# Patient Record
Sex: Male | Born: 1960 | Hispanic: No | Marital: Married | Smoking: Never smoker
Health system: Southern US, Community
[De-identification: ages and names within clinical notes are randomized; demographics above are authoritative.]

## PROBLEM LIST (undated history)

## (undated) DIAGNOSIS — G8929 Other chronic pain: Secondary | ICD-10-CM

## (undated) DIAGNOSIS — M542 Cervicalgia: Secondary | ICD-10-CM

## (undated) DIAGNOSIS — M549 Dorsalgia, unspecified: Secondary | ICD-10-CM

## (undated) HISTORY — PX: CHOLECYSTECTOMY: SHX55

---

## 2015-10-02 ENCOUNTER — Inpatient Hospital Stay
Admission: EM | Admit: 2015-10-02 | Discharge: 2015-10-03 | DRG: 948 | Disposition: A | Discharge status: Home / Self Care | Payer: Medicaid Other | Attending: Internal Medicine | Admitting: Internal Medicine

## 2015-10-02 ENCOUNTER — Encounter: Payer: Self-pay | Admitting: Emergency Medicine

## 2015-10-02 ENCOUNTER — Emergency Department: Payer: Medicaid Other

## 2015-10-02 DIAGNOSIS — Z823 Family history of stroke: Secondary | ICD-10-CM | POA: Diagnosis not present

## 2015-10-02 DIAGNOSIS — Z833 Family history of diabetes mellitus: Secondary | ICD-10-CM

## 2015-10-02 DIAGNOSIS — R413 Other amnesia: Secondary | ICD-10-CM | POA: Diagnosis present

## 2015-10-02 DIAGNOSIS — G459 Transient cerebral ischemic attack, unspecified: Secondary | ICD-10-CM | POA: Diagnosis not present

## 2015-10-02 DIAGNOSIS — E781 Pure hyperglyceridemia: Secondary | ICD-10-CM | POA: Diagnosis present

## 2015-10-02 DIAGNOSIS — Z8249 Family history of ischemic heart disease and other diseases of the circulatory system: Secondary | ICD-10-CM | POA: Diagnosis not present

## 2015-10-02 DIAGNOSIS — G454 Transient global amnesia: Secondary | ICD-10-CM | POA: Diagnosis not present

## 2015-10-02 HISTORY — DX: Dorsalgia, unspecified: M54.2

## 2015-10-02 HISTORY — DX: Other chronic pain: G89.29

## 2015-10-02 HISTORY — DX: Dorsalgia, unspecified: M54.9

## 2015-10-02 LAB — COMPREHENSIVE METABOLIC PANEL
ALBUMIN: 4.3 g/dL (ref 3.5–5.0)
ALT: 27 U/L (ref 17–63)
AST: 28 U/L (ref 15–41)
Alkaline Phosphatase: 57 U/L (ref 38–126)
Anion gap: 7 (ref 5–15)
BILIRUBIN TOTAL: 1.2 mg/dL (ref 0.3–1.2)
BUN: 10 mg/dL (ref 6–20)
CHLORIDE: 106 mmol/L (ref 101–111)
CO2: 26 mmol/L (ref 22–32)
CREATININE: 0.87 mg/dL (ref 0.61–1.24)
Calcium: 8.7 mg/dL — ABNORMAL LOW (ref 8.9–10.3)
GFR calc Af Amer: >60 mL/min (ref >60)
GLUCOSE: 108 mg/dL — AB (ref 65–99)
Potassium: 3.8 mmol/L (ref 3.5–5.1)
Sodium: 139 mmol/L (ref 135–145)
TOTAL PROTEIN: 7.6 g/dL (ref 6.5–8.1)

## 2015-10-02 LAB — CBC WITH DIFFERENTIAL/PLATELET
Basophils Absolute: 0 10*3/uL (ref 0–0.1)
Basophils Relative: 1 %
EOS ABS: 0 10*3/uL (ref 0–0.7)
EOS PCT: 1 %
HCT: 48.1 % (ref 40.0–52.0)
Hemoglobin: 16.5 g/dL (ref 13.0–18.0)
LYMPHS ABS: 1 10*3/uL (ref 1.0–3.6)
LYMPHS PCT: 17 %
MCH: 30 pg (ref 26.0–34.0)
MCHC: 34.2 g/dL (ref 32.0–36.0)
MCV: 87.7 fL (ref 80.0–100.0)
MONO ABS: 0.4 10*3/uL (ref 0.2–1.0)
Monocytes Relative: 7 %
Neutro Abs: 4.6 10*3/uL (ref 1.4–6.5)
Neutrophils Relative %: 76 %
PLATELETS: 223 10*3/uL (ref 150–440)
RBC: 5.49 MIL/uL (ref 4.40–5.90)
RDW: 14.8 % — AB (ref 11.5–14.5)
WBC: 6.1 10*3/uL (ref 3.8–10.6)

## 2015-10-02 LAB — URINALYSIS COMPLETE WITH MICROSCOPIC (ARMC ONLY)
BACTERIA UA: NONE SEEN
Bilirubin Urine: NEGATIVE
Glucose, UA: NEGATIVE mg/dL
Ketones, ur: NEGATIVE mg/dL
Leukocytes, UA: NEGATIVE
NITRITE: NEGATIVE
PH: 7 (ref 5.0–8.0)
PROTEIN: NEGATIVE mg/dL
SQUAMOUS EPITHELIAL / LPF: NONE SEEN
Specific Gravity, Urine: 1.004 — ABNORMAL LOW (ref 1.005–1.030)
WBC UA: NONE SEEN WBC/hpf (ref 0–5)

## 2015-10-02 LAB — URINE DRUG SCREEN, QUALITATIVE (ARMC ONLY)
Amphetamines, Ur Screen: NOT DETECTED
BARBITURATES, UR SCREEN: NOT DETECTED
BENZODIAZEPINE, UR SCRN: NOT DETECTED
CANNABINOID 50 NG, UR ~~LOC~~: NOT DETECTED
Cocaine Metabolite,Ur ~~LOC~~: NOT DETECTED
MDMA (Ecstasy)Ur Screen: NOT DETECTED
METHADONE SCREEN, URINE: NOT DETECTED
Opiate, Ur Screen: NOT DETECTED
Phencyclidine (PCP) Ur S: NOT DETECTED
TRICYCLIC, UR SCREEN: NOT DETECTED

## 2015-10-02 LAB — PROTIME-INR
INR: 1.06
PROTHROMBIN TIME: 14 s (ref 11.4–15.0)

## 2015-10-02 LAB — TROPONIN I: Troponin I: <0.03 ng/mL (ref <0.03)

## 2015-10-02 MED ORDER — THIAMINE HCL 100 MG/ML IJ SOLN
100.0000 mg | Freq: Every day | INTRAMUSCULAR | Status: DC
  Administered 2015-10-03 (×2): 100 mg via INTRAVENOUS
  Filled 2015-10-02 (×2): qty 2

## 2015-10-02 MED ORDER — ENOXAPARIN SODIUM 40 MG/0.4ML ~~LOC~~ SOLN: 40.0000 mg | SUBCUTANEOUS | Status: DC

## 2015-10-02 MED ORDER — ACETAMINOPHEN 650 MG RE SUPP
650.0000 mg | RECTAL | Status: DC | PRN
Start: 2015-10-02 — End: 2015-10-03

## 2015-10-02 MED ORDER — STROKE: EARLY STAGES OF RECOVERY BOOK
Freq: Once | Status: AC
  Administered 2015-10-03: 01:00:00

## 2015-10-02 MED ORDER — ACETAMINOPHEN 325 MG PO TABS: 650.0000 mg | ORAL_TABLET | ORAL | Status: DC | PRN

## 2015-10-02 NOTE — H&P (Signed)
[facility]   PATIENT NAME: Rick Lowery    MR#:  [id number]  DATE OF BIRTH:  14-Jan-1961  DATE OF ADMISSION:  10/02/2015  PRIMARY CARE PHYSICIAN: No PCP Per Patient   REQUESTING/REFERRING PHYSICIAN: Derrill Kay, MD  CHIEF COMPLAINT:   Chief Complaint  Patient presents with  . Memory Loss    HISTORY OF PRESENT ILLNESS:  Rick Lowery  is a 55 y.o. male who presents with Complaint of memory loss. Patient and family state that starting this afternoon patient began asking his family questions about things that have happened earlier today he could not remember. Family states that patient was unable to tell them the date or what he had done in the morning. Upon interview by this writer patient also had difficulty remembering other things that involved long-term memory as well. Patient was unable to recall where he had worked for the last 10 years, with the last president was, recalling President Bush from 10 years ago as well. Initial workup in the ED was largely unremarkable, so hospitals were called for further workup and evaluation. Patient and family deny any history of focal muscular weakness, headache, fever or chills or infectious symptoms, seizure activity or seizure history.  PAST MEDICAL HISTORY:   Past Medical History  Diagnosis Date  . Chronic neck and back pain     PAST SURGICAL HISTORY:   Past Surgical History  Procedure Laterality Date  . Cholecystectomy      SOCIAL HISTORY:   Social History  Substance Use Topics  . Smoking status: Never Smoker   . Smokeless tobacco: Not on file  . Alcohol Use: No    FAMILY HISTORY:   Family History  Problem Relation Age of Onset  . Stroke Father   . Diabetes Mother   . Heart disease Mother     DRUG ALLERGIES:  No Known Allergies  MEDICATIONS AT HOME:   Prior to Admission medications   Not on File    REVIEW OF SYSTEMS:  Review of Systems  Constitutional:  Negative for fever, chills, weight loss and malaise/fatigue.  HENT: Negative for ear pain, hearing loss and tinnitus.   Eyes: Negative for blurred vision, double vision, pain and redness.  Respiratory: Negative for cough, hemoptysis and shortness of breath.   Cardiovascular: Negative for chest pain, palpitations, orthopnea and leg swelling.  Gastrointestinal: Negative for nausea, vomiting, abdominal pain, diarrhea and constipation.  Genitourinary: Negative for dysuria, frequency and hematuria.  Musculoskeletal: Negative for back pain, joint pain and neck pain.  Skin:       No acne, rash, or lesions  Neurological: Negative for dizziness, tremors, focal weakness and weakness.       Memory loss  Endo/Heme/Allergies: Negative for polydipsia. Does not bruise/bleed easily.  Psychiatric/Behavioral: Negative for depression. The patient is not nervous/anxious and does not have insomnia.      VITAL SIGNS:   Filed Vitals:   10/02/15 1948 10/02/15 2000 10/02/15 2030  BP: 167/88 167/97 169/89  Pulse: 79 73 76  Temp: 98 F (36.7 C)    TempSrc: Oral    Resp: 18 19 14   Weight: 103.63 kg (228 lb 7.4 oz)    SpO2: 98% 97% 96%   Wt Readings from Last 3 Encounters:  10/02/15 103.63 kg (228 lb 7.4 oz)    PHYSICAL EXAMINATION:  Physical Exam  Vitals reviewed. Constitutional: He appears well-developed and well-nourished. No distress.  HENT:  Head: Normocephalic and atraumatic.  Mouth/Throat: Oropharynx  is clear and moist.  Eyes: Conjunctivae and EOM are normal. Pupils are equal, round, and reactive to light. No scleral icterus.  Neck: Normal range of motion. Neck supple. No JVD present. No thyromegaly present.  Cardiovascular: Normal rate, regular rhythm and intact distal pulses.  Exam reveals no gallop and no friction rub.   No murmur heard. Respiratory: Effort normal and breath sounds normal. No respiratory distress. He has no wheezes. He has no rales.  GI: Soft. Bowel sounds are normal. He  exhibits no distension. There is no tenderness.  Musculoskeletal: Normal range of motion. He exhibits no edema.  No arthritis, no gout  Lymphadenopathy:    He has no cervical adenopathy.  Neurological: He is alert. No cranial nerve deficit.  Cranial nerves II-XII intact, Sensation intact to light touch/pinprick, 5/5 strength in all extremities, no dysarthria, no aphasia, no dysphagia, memory loss, see history of present illness for details, finger to nose testing showed no abnormality, no pronator drift, DTR intact, Babinski sign not present.   Skin: Skin is warm and dry. No rash noted. No erythema.  Psychiatric: He has a normal mood and affect. His behavior is normal. Judgment normal.    LABORATORY PANEL:   CBC  Recent Labs Lab 10/02/15 2007  WBC 6.1  HGB 16.5  HCT 48.1  PLT 223   ------------------------------------------------------------------------------------------------------------------  Chemistries   Recent Labs Lab 10/02/15 2007  NA 139  K 3.8  CL 106  CO2 26  GLUCOSE 108*  BUN 10  CREATININE 0.87  CALCIUM 8.7*  AST 28  ALT 27  ALKPHOS 57  BILITOT 1.2   ------------------------------------------------------------------------------------------------------------------  Cardiac Enzymes  Recent Labs Lab 10/02/15 2007  TROPONINI <0.03   ------------------------------------------------------------------------------------------------------------------  RADIOLOGY:  Ct Head Wo Contrast  10/02/2015  CLINICAL DATA:  Memory loss.  Code stroke. EXAM: CT HEAD WITHOUT CONTRAST TECHNIQUE: Contiguous axial images were obtained from the base of the skull through the vertex without intravenous contrast. COMPARISON:  None. FINDINGS: Ventricles and sulci are appropriate for patient's age. No evidence for acute large territory infarct, intracranial hemorrhage, mass lesion or mass-effect. Suggestion of increased density within the right MCA (image 14; series 2). Orbits are  unremarkable. Paranasal sinuses are well aerated. Mastoid air cells are unremarkable. Calvarium is intact. IMPRESSION: Suggestion of hyperdense right MCA. Cannot exclude thrombus. Recommend clinical correlation and MRI/ MRA as clinically indicated. No evidence for acute intracranial hemorrhage. Critical Value/emergent results were called by telephone at the time of interpretation on 10/02/2015 at 7:49 pm to Dr. Phineas Semen , who verbally acknowledged these results. Electronically Signed   By: Annia Belt M.D.   On: 10/02/2015 19:49    EKG:   Orders placed or performed during the hospital encounter of 10/02/15  . EKG 12-Lead  . EKG 12-Lead    IMPRESSION AND PLAN:  Principal Problem:   Amnesia - unclear etiology at this time. CT scan in the ED had only one abnormality commented where question the possibility of a venous thrombosis. We will pursue standard stroke workup to start per stroke order set, which will also give [place] MRI/MRA for further elucidation of any cerebral pathology. Neurology consult ordered.  All the records are reviewed and case discussed with ED provider. Management plans discussed with the patient and/or family.  DVT PROPHYLAXIS: SubQ lovenox  GI PROPHYLAXIS: None  ADMISSION STATUS: Inpatient  CODE STATUS: Full Code Status History    This patient does not have a recorded code status. Please follow your organizational policy for  patients in this situation.      TOTAL TIME TAKING CARE OF THIS PATIENT: 45 minutes.    Lovene Maret FIELDING 10/02/2015, 9:10 PM  [facility]  Office  [phone]  CC: Primary care physician; No PCP Per Patient

## 2015-10-02 NOTE — ED Notes (Signed)
Neuro dr on soc for exam at this time

## 2015-10-02 NOTE — ED Provider Notes (Signed)
[facility]   ____________________________________________  Time seen: ~1945  I have reviewed the triage vital signs and the nursing notes.   HISTORY  Chief Complaint Memory loss  History limited by: Memory loss   HPI Rick Lowery is a 55 y.o. male who is brought to the emergency department today by his family because of concerns for memory loss. The patient was noted to have had memory loss by family roughly 30 minutes prior to my evaluation. He came into the room and asked what day it was what he done throughout the day. Family then realized he did not know the day or month or the events of the day. Family states that the last time they saw him normal was around 2 PM. The patient never had symptoms like this before. He denies any pain. Unfortunately it is clear that he is quite confused because he repeatedly asks why he is in the emergency department.    History reviewed. No pertinent past medical history.  There are no active problems to display for this patient.   History reviewed. No pertinent past surgical history.  No current outpatient prescriptions on file.  Allergies Review of patient's allergies indicates no known allergies.  No family history on file.  Social History Social History  Substance Use Topics  . Smoking status: Never Smoker   . Smokeless tobacco: None  . Alcohol Use: No    Review of Systems  Constitutional: Negative for fever. Cardiovascular: Negative for chest pain. Respiratory: Negative for shortness of breath. Gastrointestinal: Negative for abdominal pain, vomiting and diarrhea. Neurological: Negative for headaches, focal weakness or numbness. 10-point ROS otherwise negative.  ____________________________________________   PHYSICAL EXAM:  VITAL SIGNS:   98 F (36.7 C)  79  18   167/88 mmHg  98 %     Constitutional: Awake and alert. Not oriented to day, month,  events. Eyes: Conjunctivae are normal. PERRL. Normal extraocular movements. ENT   Head: Normocephalic and atraumatic.   Nose: No congestion/rhinnorhea.   Mouth/Throat: Mucous membranes are moist.   Neck: No stridor. Hematological/Lymphatic/Immunilogical: No cervical lymphadenopathy. Cardiovascular: Normal rate, regular rhythm.  No murmurs, rubs, or gallops. Respiratory: Normal respiratory effort without tachypnea nor retractions. Breath sounds are clear and equal bilaterally. No wheezes/rales/rhonchi. Gastrointestinal: Soft and nontender. No distention. T Genitourinary: Deferred Musculoskeletal: Normal range of motion in all extremities. No joint effusions.  No lower extremity tenderness nor edema. Neurologic:  Normal speech and language. Patient unable to recall the day, month or events. Repeatedly asks the same question why he is here overnight over again. Does not know how he arrived at the hospital. Does not appear to be able to form any short-term memory at this time. Face is symmetric. PERRL. Extraocular motions intact. Tongue midline. No upper or lower extremity drift. Sensation grossly intact. Finger to nose normal. No gross focal neurologic deficits are appreciated.  Skin:  Skin is warm, dry and intact. No rash noted.   ____________________________________________    LABS (pertinent positives/negatives)  Labs Reviewed  COMPREHENSIVE METABOLIC PANEL - Abnormal; Notable for the following:    Glucose, Bld 108 (*)    Calcium 8.7 (*)    All other components within normal limits  CBC WITH DIFFERENTIAL/PLATELET - Abnormal; Notable for the following:    RDW 14.8 (*)    All other components within normal limits  TROPONIN I  PROTIME-INR    ____________________________________________   EKG  I, Phineas SemenGraydon Kalmen Lollar, attending physician, personally viewed  and interpreted this EKG  EKG Time: 1951 Rate: 77 Rhythm: normal sinus rhythm Axis: left axis deviation Intervals:  qtc 427 QRS: narrow ST changes: no st elevation Impression: abnormal ekg  ____________________________________________    RADIOLOGY  CT head IMPRESSION: Suggestion of hyperdense right MCA. Cannot exclude thrombus. Recommend clinical correlation and MRI/ MRA as clinically indicated.  No evidence for acute intracranial hemorrhage.  I, Konnor Vondrasek, personally viewed and evaluated these images (plain radiographs) as part of my medical decision making. ____________________________________________   PROCEDURES  Procedure(s) performed: None  Critical Care performed: No  ____________________________________________   INITIAL IMPRESSION / ASSESSMENT AND PLAN / ED COURSE  Pertinent labs & imaging results that were available during my care of the patient were reviewed by me and considered in my medical decision making (see chart for details).  Patient presented to the emergency department today brought in by family today because of concerns for memory loss. Last known normal was roughly 5-1/2 hours prior to my evaluation. The patient does not appear to be able to perform any new short-term memory. Additionally he is unable to recall the month day or why he is here. No other focal neuro deficits. Teleneurology saw patient thinks this potentially global transient amnesia. They did recommend admission for further workup and testing.  ____________________________________________   FINAL CLINICAL IMPRESSION(S) / ED DIAGNOSES  Final diagnoses:  Amnesia     Note: This dictation was prepared with Dragon dictation. Any transcriptional errors that result from this process are unintentional    Phineas Semen, MD 10/02/15 2040

## 2015-10-02 NOTE — ED Notes (Signed)
Per wife patient developed memory loss and confusion about 20 minutes prior to arrival. Wife states that the patient took a nap and when woke was repeating questions and could not remember what he had been doing today. Wife reports that the last time he was seen normal was around 14:00.

## 2015-10-03 ENCOUNTER — Inpatient Hospital Stay (HOSPITAL_COMMUNITY)
Admit: 2015-10-03 | Discharge: 2015-10-03 | Disposition: A | Discharge status: Home / Self Care | Payer: Medicaid Other | Attending: Internal Medicine | Admitting: Internal Medicine

## 2015-10-03 ENCOUNTER — Inpatient Hospital Stay: Payer: Medicaid Other

## 2015-10-03 DIAGNOSIS — G459 Transient cerebral ischemic attack, unspecified: Secondary | ICD-10-CM

## 2015-10-03 DIAGNOSIS — G454 Transient global amnesia: Secondary | ICD-10-CM

## 2015-10-03 LAB — LIPID PANEL
CHOL/HDL RATIO: 5.5 ratio
Cholesterol: 166 mg/dL (ref 0–200)
HDL: 30 mg/dL — ABNORMAL LOW (ref >40)
LDL CALC: 86 mg/dL (ref 0–99)
Triglycerides: 252 mg/dL — ABNORMAL HIGH (ref <150)
VLDL: 50 mg/dL — AB (ref 0–40)

## 2015-10-03 LAB — ECHOCARDIOGRAM COMPLETE
HEIGHTINCHES: 68 in
Weight: 3571 oz

## 2015-10-03 LAB — HEMOGLOBIN A1C: Hgb A1c MFr Bld: 5.9 % (ref 4.0–6.0)

## 2015-10-03 MED ORDER — ASPIRIN EC 81 MG PO TBEC: 81.0000 mg | DELAYED_RELEASE_TABLET | Freq: Every day | ORAL | Status: AC

## 2015-10-03 MED ORDER — GADOBENATE DIMEGLUMINE 529 MG/ML IV SOLN
20.0000 mL | Freq: Once | INTRAVENOUS | Status: AC | PRN
  Administered 2015-10-03: 11:00:00 20 mL via INTRAVENOUS

## 2015-10-03 NOTE — Discharge Summary (Signed)
Rick Lowery, 55 y.o., DOB [birth year], MRN [id number]. Admission date: 10/02/2015 Discharge Date 10/03/2015 Primary MD No PCP Per Patient Admitting Physician Oralia Manis, MD  Admission Diagnosis  Amnesia [R41.3]  Discharge Diagnosis   Principal Problem:   Amnesia   Chronic neck and back pain Hypertriglyceridemia        Hospital Course  Rick Lowery is a 55 y.o. male who presents with Complaint of memory loss. Patient and family state that starting this afternoon patient began asking his family questions about things that have happened earlier today he could not remember. Family states that patient was unable to tell them the date or what he had done in the morning. Upon interview by this writer patient also had difficulty remembering other things that involved long-term memory as well. Patient was unable to recall where he had worked for the last 10 years, with the last president was, recalling President Bush from 10 years ago as well. Initial workup in the ED was largely unremarkable, so hospitals were called for further workup and evaluation. The CT scan of the head showed possible abnormality. He was admitted underwent MRI of the brain and MRA of the brain which was completely normal. He does report some stressors at work. Patient has a echo that is currently pending. He'll be seen by neurology prior to discharge. His symptoms are completely resolved possible TIA could be the explanation for his symptoms          Consults  neurology  Significant Tests:  See full reports for all details      Ct Head Wo Contrast  10/02/2015  CLINICAL DATA:  Memory loss.  Code stroke. EXAM: CT HEAD WITHOUT CONTRAST TECHNIQUE: Contiguous axial images were obtained from the base of the skull through the vertex without intravenous contrast. COMPARISON:  None. FINDINGS: Ventricles and sulci are appropriate for patient's age. No evidence for acute large territory infarct, intracranial  hemorrhage, mass lesion or mass-effect. Suggestion of increased density within the right MCA (image 14; series 2). Orbits are unremarkable. Paranasal sinuses are well aerated. Mastoid air cells are unremarkable. Calvarium is intact. IMPRESSION: Suggestion of hyperdense right MCA. Cannot exclude thrombus. Recommend clinical correlation and MRI/ MRA as clinically indicated. No evidence for acute intracranial hemorrhage. Critical Value/emergent results were called by telephone at the time of interpretation on 10/02/2015 at 7:49 pm to Dr. Phineas Semen , who verbally acknowledged these results. Electronically Signed   By: Annia Belt M.D.   On: 10/02/2015 19:49   Mr Angiogram Neck W Wo Contrast  10/03/2015  CLINICAL DATA:  55 year old male with episode of severe memory loss yesterday. Initial encounter. EXAM: MRI HEAD WITHOUT CONTRAST MRA HEAD WITHOUT CONTRAST MRA NECK WITHOUT AND WITH CONTRAST TECHNIQUE: Multiplanar, multiecho pulse sequences of the brain and surrounding structures were obtained without intravenous contrast. Angiographic images of the Circle of Willis were obtained using MRA technique without intravenous contrast. Angiographic images of the neck were obtained using MRA technique without and with intravenous contrast. Carotid stenosis measurements (when applicable) are obtained utilizing NASCET criteria, using the distal internal carotid diameter as the denominator. CONTRAST:  20mL MULTIHANCE GADOBENATE DIMEGLUMINE 529 MG/ML IV SOLN COMPARISON:  Noncontrast head CT 10/02/2015 FINDINGS: MRI HEAD FINDINGS Major intracranial vascular flow voids are preserved, the distal right vertebral artery appears dominant. No restricted diffusion or evidence of acute infarction. No midline shift, mass effect, evidence of mass lesion, ventriculomegaly, extra-axial collection or acute intracranial hemorrhage. Cervicomedullary junction and pituitary are within normal limits.  Negative visualized cervical spine. Wallace Cullens  and white matter signal is within normal limits for age throughout the brain. No cortical encephalomalacia or chronic cerebral blood products identified. Temporal lobe structures appear normal. Visible internal auditory structures appear normal. Visualized paranasal sinuses and mastoids are stable and well pneumatized. Negative orbit and scalp soft tissues. Normal bone marrow signal. MRA NECK FINDINGS Precontrast time-of-flight images demonstrate antegrade flow in both carotid and vertebral arteries in the neck. The right vertebral artery is dominant. The carotid bifurcations appear normal. Post-contrast neck MRA images reveal a 3 vessel arch configuration with no great vessel origin stenosis. Negative right CCA, right carotid bifurcation, and cervical right ICA. Negative left CCA. Minimal irregularity at the left carotid bifurcation and left ICA origin compatible with atherosclerosis. No subsequent stenosis. Mildly tortuous cervical left ICA just distal to the bulb. No proximal subclavian artery stenosis. Mild irregularity at both vertebral artery origins does not appear hemodynamically significant. The right vertebral is dominant throughout with no significant stenosis. Negative left vertebral artery, which is mildly diminutive distal to the left PICA origin. MRA HEAD FINDINGS Antegrade flow in the posterior circulation with dominant right and diminutive distal left vertebral artery is. Normal PICA origins. Patent vertebrobasilar junction. No basilar stenosis. Normal SCA origins. Fetal type right PCA origin. Left posterior communicating artery diminutive or absent. Normal bilateral PCA branches. Antegrade flow in both ICA siphons. The right siphon appears mildly dominant. No siphon stenosis. Normal ophthalmic and right posterior communicating artery origins. Patent carotid termini. Dominant right ACA A1 segment. Normal MCA and ACA origins. Anterior communicating artery and visualized bilateral ACA branches are  normal aside from mild tortuosity. MCA M1 segments are also somewhat tortuous, more so the right. MCA bifurcations and visualized bilateral MCA branches are within normal limits. IMPRESSION: 1. No acute intracranial abnormality. Normal for age noncontrast MRI appearance of the brain. 2. Negative neck MRA aside from mild left carotid and vertebral origin atherosclerosis. No hemodynamically significant stenosis identified. 3. Negative intracranial MRA aside from mild vessel tortuosity. Normal anatomic variants including fetal type right PCA origin an dominant right ACA A1 segment. Electronically Signed   By: Odessa Fleming M.D.   On: 10/03/2015 10:47   Mr Brain Wo Contrast  10/03/2015  CLINICAL DATA:  55 year old male with episode of severe memory loss yesterday. Initial encounter. EXAM: MRI HEAD WITHOUT CONTRAST MRA HEAD WITHOUT CONTRAST MRA NECK WITHOUT AND WITH CONTRAST TECHNIQUE: Multiplanar, multiecho pulse sequences of the brain and surrounding structures were obtained without intravenous contrast. Angiographic images of the Circle of Willis were obtained using MRA technique without intravenous contrast. Angiographic images of the neck were obtained using MRA technique without and with intravenous contrast. Carotid stenosis measurements (when applicable) are obtained utilizing NASCET criteria, using the distal internal carotid diameter as the denominator. CONTRAST:  20mL MULTIHANCE GADOBENATE DIMEGLUMINE 529 MG/ML IV SOLN COMPARISON:  Noncontrast head CT 10/02/2015 FINDINGS: MRI HEAD FINDINGS Major intracranial vascular flow voids are preserved, the distal right vertebral artery appears dominant. No restricted diffusion or evidence of acute infarction. No midline shift, mass effect, evidence of mass lesion, ventriculomegaly, extra-axial collection or acute intracranial hemorrhage. Cervicomedullary junction and pituitary are within normal limits. Negative visualized cervical spine. Wallace Cullens and white matter signal is  within normal limits for age throughout the brain. No cortical encephalomalacia or chronic cerebral blood products identified. Temporal lobe structures appear normal. Visible internal auditory structures appear normal. Visualized paranasal sinuses and mastoids are stable and well pneumatized. Negative orbit and scalp soft tissues.  Normal bone marrow signal. MRA NECK FINDINGS Precontrast time-of-flight images demonstrate antegrade flow in both carotid and vertebral arteries in the neck. The right vertebral artery is dominant. The carotid bifurcations appear normal. Post-contrast neck MRA images reveal a 3 vessel arch configuration with no great vessel origin stenosis. Negative right CCA, right carotid bifurcation, and cervical right ICA. Negative left CCA. Minimal irregularity at the left carotid bifurcation and left ICA origin compatible with atherosclerosis. No subsequent stenosis. Mildly tortuous cervical left ICA just distal to the bulb. No proximal subclavian artery stenosis. Mild irregularity at both vertebral artery origins does not appear hemodynamically significant. The right vertebral is dominant throughout with no significant stenosis. Negative left vertebral artery, which is mildly diminutive distal to the left PICA origin. MRA HEAD FINDINGS Antegrade flow in the posterior circulation with dominant right and diminutive distal left vertebral artery is. Normal PICA origins. Patent vertebrobasilar junction. No basilar stenosis. Normal SCA origins. Fetal type right PCA origin. Left posterior communicating artery diminutive or absent. Normal bilateral PCA branches. Antegrade flow in both ICA siphons. The right siphon appears mildly dominant. No siphon stenosis. Normal ophthalmic and right posterior communicating artery origins. Patent carotid termini. Dominant right ACA A1 segment. Normal MCA and ACA origins. Anterior communicating artery and visualized bilateral ACA branches are normal aside from mild  tortuosity. MCA M1 segments are also somewhat tortuous, more so the right. MCA bifurcations and visualized bilateral MCA branches are within normal limits. IMPRESSION: 1. No acute intracranial abnormality. Normal for age noncontrast MRI appearance of the brain. 2. Negative neck MRA aside from mild left carotid and vertebral origin atherosclerosis. No hemodynamically significant stenosis identified. 3. Negative intracranial MRA aside from mild vessel tortuosity. Normal anatomic variants including fetal type right PCA origin an dominant right ACA A1 segment. Electronically Signed   By: Odessa Fleming M.D.   On: 10/03/2015 10:47   Mr Maxine Glenn Head/brain Wo Cm  10/03/2015  CLINICAL DATA:  55 year old male with episode of severe memory loss yesterday. Initial encounter. EXAM: MRI HEAD WITHOUT CONTRAST MRA HEAD WITHOUT CONTRAST MRA NECK WITHOUT AND WITH CONTRAST TECHNIQUE: Multiplanar, multiecho pulse sequences of the brain and surrounding structures were obtained without intravenous contrast. Angiographic images of the Circle of Willis were obtained using MRA technique without intravenous contrast. Angiographic images of the neck were obtained using MRA technique without and with intravenous contrast. Carotid stenosis measurements (when applicable) are obtained utilizing NASCET criteria, using the distal internal carotid diameter as the denominator. CONTRAST:  20mL MULTIHANCE GADOBENATE DIMEGLUMINE 529 MG/ML IV SOLN COMPARISON:  Noncontrast head CT 10/02/2015 FINDINGS: MRI HEAD FINDINGS Major intracranial vascular flow voids are preserved, the distal right vertebral artery appears dominant. No restricted diffusion or evidence of acute infarction. No midline shift, mass effect, evidence of mass lesion, ventriculomegaly, extra-axial collection or acute intracranial hemorrhage. Cervicomedullary junction and pituitary are within normal limits. Negative visualized cervical spine. Wallace Cullens and white matter signal is within normal limits  for age throughout the brain. No cortical encephalomalacia or chronic cerebral blood products identified. Temporal lobe structures appear normal. Visible internal auditory structures appear normal. Visualized paranasal sinuses and mastoids are stable and well pneumatized. Negative orbit and scalp soft tissues. Normal bone marrow signal. MRA NECK FINDINGS Precontrast time-of-flight images demonstrate antegrade flow in both carotid and vertebral arteries in the neck. The right vertebral artery is dominant. The carotid bifurcations appear normal. Post-contrast neck MRA images reveal a 3 vessel arch configuration with no great vessel origin stenosis. Negative right CCA, right carotid  bifurcation, and cervical right ICA. Negative left CCA. Minimal irregularity at the left carotid bifurcation and left ICA origin compatible with atherosclerosis. No subsequent stenosis. Mildly tortuous cervical left ICA just distal to the bulb. No proximal subclavian artery stenosis. Mild irregularity at both vertebral artery origins does not appear hemodynamically significant. The right vertebral is dominant throughout with no significant stenosis. Negative left vertebral artery, which is mildly diminutive distal to the left PICA origin. MRA HEAD FINDINGS Antegrade flow in the posterior circulation with dominant right and diminutive distal left vertebral artery is. Normal PICA origins. Patent vertebrobasilar junction. No basilar stenosis. Normal SCA origins. Fetal type right PCA origin. Left posterior communicating artery diminutive or absent. Normal bilateral PCA branches. Antegrade flow in both ICA siphons. The right siphon appears mildly dominant. No siphon stenosis. Normal ophthalmic and right posterior communicating artery origins. Patent carotid termini. Dominant right ACA A1 segment. Normal MCA and ACA origins. Anterior communicating artery and visualized bilateral ACA branches are normal aside from mild tortuosity. MCA M1 segments  are also somewhat tortuous, more so the right. MCA bifurcations and visualized bilateral MCA branches are within normal limits. IMPRESSION: 1. No acute intracranial abnormality. Normal for age noncontrast MRI appearance of the brain. 2. Negative neck MRA aside from mild left carotid and vertebral origin atherosclerosis. No hemodynamically significant stenosis identified. 3. Negative intracranial MRA aside from mild vessel tortuosity. Normal anatomic variants including fetal type right PCA origin an dominant right ACA A1 segment. Electronically Signed   By: Odessa Fleming M.D.   On: 10/03/2015 10:47       Today   Subjective:   Rick Lowery  patient feels well denies any symptoms memory back to normal except he Still cannot remember the events of yesterday  Objective:   Blood pressure 130/72, pulse 64, temperature 98.2 F (36.8 C), temperature source Oral, resp. rate 20, height 5\' 8"  (1.727 m), weight 101.237 kg (223 lb 3 oz), SpO2 95 %.  .  Intake/Output Summary (Last 24 hours) at 10/03/15 1336 Last data filed at 10/03/15 0900  Gross per 24 hour  Intake    240 ml  Output      0 ml  Net    240 ml    Exam VITAL SIGNS: Blood pressure 130/72, pulse 64, temperature 98.2 F (36.8 C), temperature source Oral, resp. rate 20, height 5\' 8"  (1.727 m), weight 101.237 kg (223 lb 3 oz), SpO2 95 %.  GENERAL:  55 y.o.-year-old patient lying in the bed with no acute distress.  EYES: Pupils equal, round, reactive to light and accommodation. No scleral icterus. Extraocular muscles intact.  HEENT: Head atraumatic, normocephalic. Oropharynx and nasopharynx clear.  NECK:  Supple, no jugular venous distention. No thyroid enlargement, no tenderness.  LUNGS: Normal breath sounds bilaterally, no wheezing, rales,rhonchi or crepitation. No use of accessory muscles of respiration.  CARDIOVASCULAR: S1, S2 normal. No murmurs, rubs, or gallops.  ABDOMEN: Soft, nontender, nondistended. Bowel sounds present. No  organomegaly or mass.  EXTREMITIES: No pedal edema, cyanosis, or clubbing.  NEUROLOGIC: Cranial nerves II through XII are intact. Muscle strength 5/5 in all extremities. Sensation intact. Gait not checked.  PSYCHIATRIC: The patient is alert and oriented x 3.  SKIN: No obvious rash, lesion, or ulcer.   Data Review     CBC w Diff: Lab Results  Component Value Date   WBC 6.1 10/02/2015   HGB 16.5 10/02/2015   HCT 48.1 10/02/2015   PLT 223 10/02/2015   LYMPHOPCT 17 10/02/2015  MONOPCT 7 10/02/2015   EOSPCT 1 10/02/2015   BASOPCT 1 10/02/2015   CMP: Lab Results  Component Value Date   NA 139 10/02/2015   K 3.8 10/02/2015   CL 106 10/02/2015   CO2 26 10/02/2015   BUN 10 10/02/2015   CREATININE 0.87 10/02/2015   PROT 7.6 10/02/2015   ALBUMIN 4.3 10/02/2015   BILITOT 1.2 10/02/2015   ALKPHOS 57 10/02/2015   AST 28 10/02/2015   ALT 27 10/02/2015  .  Micro Results No results found for this or any previous visit (from the past 240 hour(s)).      Code Status Orders        Start     Ordered   10/02/15 2226  Full code   Continuous     10/02/15 2225    Code Status History    Date Active Date Inactive Code Status Order ID Comments User Context   This patient has a current code status but no historical code status.            Discharge Medications     Medication List    TAKE these medications        aspirin EC 81 MG tablet  Take 1 tablet (81 mg total) by mouth daily.           Total Time in preparing paper work, data evaluation and todays exam - 35 minutes  Auburn BilberryPATEL, Amilee Janvier M.D on 10/03/2015 at 1:36 PM  [facility]   Office  [phone]

## 2015-10-03 NOTE — Evaluation (Signed)
Physical Therapy Evaluation Patient Details Name: Rick Lowery MRN: [id number] DOB: 07/28/1960 Today's Date: 10/03/2015   History of Present Illness  Pt is a 55 y.o. male who presents with complaint of memory loss. Patient and family state that starting 10/02/15 patient began asking his family questions about things that have happened earlier that day. Family states that patient was unable to recall the date or events from the morning. 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 Patient and family deny any history of focal muscular weakness, headache, fever or chills or infectious symptoms, seizure activity or seizure history. At time of PT evaluation all symptoms have resolved and pt reports return to baseline  Clinical Impression  No deficits identified during PT evaluation. Pt appears to be at baseline level of function. He is independent with bed mobility, transfers, and ambulation. Pt able to perform horizontal and vertical head turns as well as gait speed changes without staggering. Able to perform 180 degree turns safely in <3 seconds and single leg stance is >10 seconds on each LE. Strength and sensation fully intact and symmetrical. All neurological bedside testing negative. No further PT needs at this time. Order will be completed. Please enter new order if status or needs change.      Follow Up Recommendations No PT follow up    Equipment Recommendations  None recommended by PT    Recommendations for Other Services       Precautions / Restrictions Precautions Precautions: None Restrictions Weight Bearing Restrictions: No      Mobility  Bed Mobility Overal bed mobility: Independent             General bed mobility comments: Good speed/sequencing noted  Transfers Overall transfer level: Independent               General transfer comment: Good speed and sequencing noted. No  deficits  Ambulation/Gait Ambulation/Gait assistance: Independent Ambulation Distance (Feet): 200 Feet Assistive device: None Gait Pattern/deviations: WFL(Within Functional Limits) Gait velocity: WFL for full community ambulation Gait velocity interpretation: at or above normal speed for age/gender General Gait Details: Pt demonstrates baseline gait which [facility]. Full community ambulation speed. Able to perform horizontal and vertical head turns as well as gait speed changes without staggering. Able to perform 180 degree turns safely in <3 seconds  Stairs            Wheelchair Mobility    Modified Rankin (Stroke Patients Only)       Balance Overall balance assessment: No apparent balance deficits (not formally assessed) Sitting-balance support: No upper extremity supported Sitting balance-Leahy Scale: Normal     Standing balance support: No upper extremity supported Standing balance-Leahy Scale: Normal Standing balance comment: Single leg stance >10 seconds bilaterally. Negative Rhomberg                             Pertinent Vitals/Pain Pain Assessment: No/denies pain    Home Living Family/patient expects to be discharged to:: Private residence Living Arrangements: Spouse/significant other;Children (5 children) Available Help at Discharge: Family Type of Home: House Home Access: Stairs to enter Entrance Stairs-Rails: Doctor, general practiceight;Left Entrance Stairs-Number of Steps: 5 Home Layout: One level Home Equipment: None Additional Comments: grab bars in bathroom per son's report    Prior Function Level of Independence: Independent  Hand Dominance   Dominant Hand: Right    Extremity/Trunk Assessment   Upper Extremity Assessment: Overall WFL for tasks assessed           Lower Extremity Assessment: Overall WFL for tasks assessed;RLE deficits/detail RLE Deficits / Details: No UE/LE strength deficits noted. Finger to nose and heel to shin  normal. Negative pronator drift and normal rapid alternating movements. Sensation and strength intact and symmetrical    Cervical / Trunk Assessment: Normal  Communication   Communication: No difficulties  Cognition Arousal/Alertness: Awake/alert Behavior During Therapy: WFL for tasks assessed/performed Overall Cognitive Status: Within Functional Limits for tasks assessed Area of Impairment: Memory     Memory: Decreased short-term memory         General Comments: Pt and spouse report improving memory, now recalls long term memory events such as where he has worked for past 10 yrs, recalls getting haircut on Sunday but spouse reports difficulty with sequencing of events on Sunday. Pt reports only unable to recall "gap of time from when I left the house and came to the hospital".    General Comments      Exercises        Assessment/Plan    PT Assessment Patent does not need any further PT services  PT Diagnosis     PT Problem List    PT Treatment Interventions     PT Goals (Current goals can be found in the Care Plan section) Acute Rehab PT Goals Patient Stated Goal: to go home PT Goal Formulation: With patient/family Time For Goal Achievement: 10/17/15 Potential to Achieve Goals: Good    Frequency     Barriers to discharge        Co-evaluation               End of Session Equipment Utilized During Treatment: Gait belt Activity Tolerance: No increased pain Patient left: in bed;with call bell/phone within reach;with family/visitor present Nurse Communication: Mobility status    Functional Assessment Tool Used: clinical judgement Functional Limitation: Mobility: Walking and moving around Mobility: Walking and Moving Around Current Status [phone]): 0 percent impaired, limited or restricted Mobility: Walking and Moving Around Goal Status [phone]): 0 percent impaired, limited or restricted Mobility: Walking and Moving Around Discharge Status [phone]): 0 percent  impaired, limited or restricted    Time: 1101-1119 PT Time Calculation (min) (ACUTE ONLY): 18 min   Charges:   PT Evaluation $PT Eval Low Complexity: 1 Procedure     PT G Codes:   PT G-Codes **NOT FOR INPATIENT CLASS** Functional Assessment Tool Used: clinical judgement Functional Limitation: Mobility: Walking and moving around Mobility: Walking and Moving Around Current Status ([id number]): 0 percent impaired, limited or restricted Mobility: Walking and Moving Around Goal Status ([id number]): 0 percent impaired, limited or restricted Mobility: Walking and Moving Around Discharge Status ([id number]): 0 percent impaired, limited or restricted   Lynnea Maizes PT, DPT   Deval Mroczka 10/03/2015, 11:41 AM

## 2015-10-03 NOTE — Progress Notes (Signed)
Echocardiogram 2D Echocardiogram has been performed.  Rick Lowery, Rick Lowery M 10/03/2015, 2:27 PM

## 2015-10-03 NOTE — Progress Notes (Signed)
Patient is alert and oriented x 4, family notes that mentation has improved dramatically compared to previous day, pt is up in room to bathroom, denies pain, good appetite, family at bedside, MRI brain and angiogram unremarkable, echo performed, awaiting on results. Per family, Dr. Allena KatzPatel told them they can be discharged without waiting on echo results, family has number provided by Dr. Allena KatzPatel. Pt is d/c to home, rx for aspirin provided to patient, patient instructed to establish a PCP and follow up. Neuro consulted with patient and family. Pushed to visitor entrance via nursing staff. Uneventful shift.

## 2015-10-03 NOTE — Progress Notes (Signed)
[facility] [place] [facility]        Sigmund Syble CreekDiez Faldin was admitted to the Hospital on 10/02/2015 and Discharged  10/03/2015 and should be excused from work/school   for7 days starting 10/02/2015 , may return to work/school without any restrictions.  Call Auburn BilberryShreyang Thresea Doble MD with questions.  Auburn BilberryPATEL, Isaul Landi M.D on 10/03/2015,at 12:06 PM  [facility] -  at [facility]    Office  [phone]

## 2015-10-03 NOTE — Progress Notes (Signed)
ST received orders, nursing consulted and wife interviewed. Pt awaiting CT scan. Wife and son report that pt's memory has improved and now appears at baseline. Nursing supports. POC established that pt would receive scans this day and allowed another day for improvement with ST to follow-up on 10/04/2015 for any remaining issues. Nursing and wife voiced agreement with plan.

## 2015-10-03 NOTE — Discharge Instructions (Addendum)
°  DIET:  Regular diet  DISCHARGE CONDITION:  Activity as tolerated  ACTIVITY:  Activity as tolerated  OXYGEN:  Home Oxygen: No.   Oxygen Delivery: room air  DISCHARGE LOCATION:  home    ADDITIONAL DISCHARGE INSTRUCTION:   If you experience worsening of your admission symptoms, develop shortness of breath, life threatening emergency, suicidal or homicidal thoughts you must seek medical attention immediately by calling 911 or calling your MD immediately  if symptoms less severe.  You Must read complete instructions/literature along with all the possible adverse reactions/side effects for all the Medicines you take and that have been prescribed to you. Take any new Medicines after you have completely understood and accpet all the possible adverse reactions/side effects.   Please note  You were cared for by a hospitalist during your hospital stay. If you have any questions about your discharge medications or the care you received while you were in the hospital after you are discharged, you can call the unit and asked to speak with the hospitalist on call if the hospitalist that took care of you is not available. Once you are discharged, your primary care physician will handle any further medical issues. Please note that NO REFILLS for any discharge medications will be authorized once you are discharged, as it is imperative that you return to your primary care physician (or establish a relationship with a primary care physician if you do not have one) for your aftercare needs so that they can reassess your need for medications and monitor your lab values.

## 2015-10-03 NOTE — Evaluation (Signed)
Occupational Therapy Evaluation Patient Details Name: Rick Lowery MRN: [id number] DOB: 01/30/1961 Today's Date: 10/03/2015    History of Present Illness Pt is a 55 y.o. male who presents with complaint of memory loss. Patient and family state that starting 10/02/15 patient began asking his family questions about things that have happened earlier that day. Family states that patient was unable to recall the date or events from the morning. 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 Patient and family deny any history of focal muscular weakness, headache, fever or chills or infectious symptoms, seizure activity or seizure history.   Clinical Impression   55 yo male pt admitted for amnesia. Pt presents today with improved long term memory (no apparent deficits) and improving short term memory with reported "memory gap" from the time he and his family left the house yesterday, 10/02/15, to the time he arrived at the hospital. Pt oriented to person, place, time, and situation upon evaluation, no visual perceptual deficits upon evaluation. Spouse reports memory back to baseline for pt. Pt independent with ADL. Pt would benefit from speech consult if not already ordered. No further OT needs at this time.     Follow Up Recommendations  No OT follow up    Equipment Recommendations  None recommended by OT    Recommendations for Other Services Speech consult     Precautions / Restrictions Precautions Precautions: None Restrictions Weight Bearing Restrictions: No      Mobility Bed Mobility Overal bed mobility: Independent                Transfers Overall transfer level: Independent               General transfer comment: performed sit<>stand and sup<>sit transfers independently with spouse and son present in room    Balance Overall balance assessment: No apparent balance deficits (not  formally assessed)                                          ADL Overall ADL's : Independent                                       General ADL Comments: Able to perform all ADL at baseline per pt and spouse report. Observed pt complete toileting task independently at start of session.     Vision Vision Assessment?: Yes Eye Alignment: Within Functional Limits Ocular Range of Motion: Within Functional Limits Alignment/Gaze Preference: Within Defined Limits Tracking/Visual Pursuits: Able to track stimulus in all quads without difficulty Convergence: Within functional limits Visual Fields: No apparent deficits   Perception Perception Perception Tested?: Yes Comments: within functional limits, no apparent perceptual deficits   Praxis Praxis Praxis tested?: Within functional limits    Pertinent Vitals/Pain Pain Assessment: No/denies pain     Hand Dominance     Extremity/Trunk Assessment Upper Extremity Assessment Upper Extremity Assessment: Overall WFL for tasks assessed, equal strength on both sides   Lower Extremity Assessment Lower Extremity Assessment: Defer to PT evaluation   Cervical / Trunk Assessment Cervical / Trunk Assessment: Normal   Communication Communication Communication: No difficulties   Cognition Arousal/Alertness: Awake/alert Behavior During Therapy: WFL for tasks assessed/performed Overall Cognitive Status: Impaired/Different from  baseline Area of Impairment: Memory     Memory: Decreased short-term memory         General Comments: Pt and spouse report improving memory, now recalls long term memory events such as where he has worked for past 10 yrs, recalls getting haircut on Sunday but spouse reports difficulty with sequencing of events on Sunday. Pt reports only unable to recall "gap of time from when I left the house and came to the hospital". Spouse reports only other behavior that was "out of the ordinary"  was pt very talkative last evening until about 4 a.m.    General Comments       Exercises       Shoulder Instructions      Home Living Family/patient expects to be discharged to:: Private residence Living Arrangements: Spouse/significant other;Children Available Help at Discharge: Family Type of Home: House       Home Layout: One level     Bathroom Shower/Tub: Tub/shower unit Shower/tub characteristics: Door Firefighter: Standard         Additional Comments: grab bars in bathroom per son's report      Prior Functioning/Environment Level of Independence: Independent             OT Diagnosis: Cognitive deficits   OT Problem List: Decreased cognition   OT Treatment/Interventions:      OT Goals(Current goals can be found in the care plan section) Acute Rehab OT Goals Patient Stated Goal: to go home OT Goal Formulation: With patient/family Potential to Achieve Goals: Good  OT Frequency:     Barriers to D/C:            Co-evaluation              End of Session    Activity Tolerance: Patient tolerated treatment well Patient left: in bed;with call bell/phone within reach;with family/visitor present   Time: 1610-9604 OT Time Calculation (min): 11 min Charges:  OT General Charges $OT Visit: 1 Procedure OT Evaluation $OT Eval Low Complexity: 1 Procedure G-Codes:    Eliezer Bottom, OTR/L 10/03/2015, 9:28 AM

## 2015-10-03 NOTE — Consult Note (Signed)
Reason for Consult:Episode of amnesia Referring Physician: Allena KatzPatel  CC: Amnesia  HPI: Rick Lowery is an 55 y.o. male who by report of family at 5p on yesterday began asking his family questions about things that have happened earlier today he could not remember. Family states that patient was unable to tell them the date or what he had done in the morning.  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  Could only remember his residence from 12 years ago.  Asked his family the same questions over and over again all night as if they had never been asked for the majority of the night but now appears to be at baseline but has some issues with recent history.    Past Medical History  Diagnosis Date  . Chronic neck and back pain     Past Surgical History  Procedure Laterality Date  . Cholecystectomy      Family History  Problem Relation Age of Onset  . Stroke Father   . Diabetes Mother   . Heart disease Mother     Social History:  reports that he has never smoked. He does not have any smokeless tobacco history on file. He reports that he does not drink alcohol or use illicit drugs.  No Known Allergies  Medications:  I have reviewed the patient's current medications. Prior to Admission:  No prescriptions prior to admission   Scheduled: . enoxaparin (LOVENOX) injection  40 mg Subcutaneous Q24H  . thiamine IV  100 mg Intravenous Daily    ROS: History obtained from the patient  General ROS: negative for - chills, fatigue, fever, night sweats, weight gain or weight loss Psychological ROS: negative for - behavioral disorder, hallucinations, memory difficulties, mood swings or suicidal ideation Ophthalmic ROS: negative for - blurry vision, double vision, eye pain or loss of vision ENT ROS: negative for - epistaxis, nasal discharge, oral lesions, sore throat, tinnitus or  vertigo Allergy and Immunology ROS: negative for - hives or itchy/watery eyes Hematological and Lymphatic ROS: negative for - bleeding problems, bruising or swollen lymph nodes Endocrine ROS: negative for - galactorrhea, hair pattern changes, polydipsia/polyuria or temperature intolerance Respiratory ROS: negative for - cough, hemoptysis, shortness of breath or wheezing Cardiovascular ROS: negative for - chest pain, dyspnea on exertion, edema or irregular heartbeat Gastrointestinal ROS: negative for - abdominal pain, diarrhea, hematemesis, nausea/vomiting or stool incontinence Genito-Urinary ROS: negative for - dysuria, hematuria, incontinence or urinary frequency/urgency Musculoskeletal ROS: negative for - joint swelling or muscular weakness Neurological ROS: as noted in HPI Dermatological ROS: negative for rash and skin lesion changes  Physical Examination: Blood pressure 120/73, pulse 59, temperature 97.9 F (36.6 C), temperature source Oral, resp. rate 20, height 5\' 8"  (1.727 m), weight 101.237 kg (223 lb 3 oz), SpO2 96 %.  HEENT-  Normocephalic, no lesions, without obvious abnormality.  Normal external eye and conjunctiva.  Normal TM's bilaterally.  Normal auditory canals and external ears. Normal external nose, mucus membranes and septum.  Normal pharynx. Cardiovascular- S1, S2 normal, pulses palpable throughout   Lungs- chest clear, no wheezing, rales, normal symmetric air entry Abdomen- soft, non-tender; bowel sounds normal; no masses,  no organomegaly Extremities- no edema Lymph-no adenopathy palpable Musculoskeletal-no joint tenderness, deformity or swelling Skin-warm and dry, no hyperpigmentation, vitiligo, or suspicious lesions  Neurological Examination Mental Status: Alert, oriented, thought content appropriate.  Speech fluent without  evidence of aphasia.  Able to follow 3 step commands without difficulty. Cranial Nerves: II: Discs flat bilaterally; Visual fields grossly  normal, pupils equal, round, reactive to light and accommodation III,IV, VI: ptosis not present, extra-ocular motions intact bilaterally V,VII: smile symmetric, facial light touch sensation normal bilaterally VIII: hearing normal bilaterally IX,X: gag reflex present XI: bilateral shoulder shrug XII: midline tongue extension Motor: Right : Upper extremity   5/5    Left:     Upper extremity   5/5  Lower extremity   5/5     Lower extremity   5/5 Tone and bulk:normal tone throughout; no atrophy noted Sensory: Pinprick and light touch intact throughout, bilaterally Deep Tendon Reflexes: 2+ and symmetric throughout Plantars: Right: downgoing   Left: downgoing Cerebellar: normal finger-to-nose and normal heel-to-shin testing bilaterally Gait: normal gait and station      Laboratory Studies:   Basic Metabolic Panel:  Recent Labs Lab 10/02/15 2007  NA 139  K 3.8  CL 106  CO2 26  GLUCOSE 108*  BUN 10  CREATININE 0.87  CALCIUM 8.7*    Liver Function Tests:  Recent Labs Lab 10/02/15 2007  AST 28  ALT 27  ALKPHOS 57  BILITOT 1.2  PROT 7.6  ALBUMIN 4.3   No results for input(s): LIPASE, AMYLASE in the last 168 hours. No results for input(s): AMMONIA in the last 168 hours.  CBC:  Recent Labs Lab 10/02/15 2007  WBC 6.1  NEUTROABS 4.6  HGB 16.5  HCT 48.1  MCV 87.7  PLT 223    Cardiac Enzymes:  Recent Labs Lab 10/02/15 2007  TROPONINI <0.03    BNP: Invalid input(s): POCBNP  CBG: No results for input(s): GLUCAP in the last 168 hours.  Microbiology: No results found for this or any previous visit.  Coagulation Studies:  Recent Labs  10/02/15 2007  LABPROT 14.0  INR 1.06    Urinalysis:  Recent Labs Lab 10/02/15 2027  COLORURINE COLORLESS*  LABSPEC 1.004*  PHURINE 7.0  GLUCOSEU NEGATIVE  HGBUR 1+*  BILIRUBINUR NEGATIVE  KETONESUR NEGATIVE  PROTEINUR NEGATIVE  NITRITE NEGATIVE  LEUKOCYTESUR NEGATIVE    Lipid Panel:     Component  Value Date/Time   CHOL 166 10/03/2015 0559   TRIG 252* 10/03/2015 0559   HDL 30* 10/03/2015 0559   CHOLHDL 5.5 10/03/2015 0559   VLDL 50* 10/03/2015 0559   LDLCALC 86 10/03/2015 0559    HgbA1C:  Lab Results  Component Value Date   HGBA1C 5.9 10/03/2015    Urine Drug Screen:     Component Value Date/Time   LABOPIA NONE DETECTED 10/02/2015 2027   COCAINSCRNUR NONE DETECTED 10/02/2015 2027   LABBENZ NONE DETECTED 10/02/2015 2027   AMPHETMU NONE DETECTED 10/02/2015 2027   THCU NONE DETECTED 10/02/2015 2027   LABBARB NONE DETECTED 10/02/2015 2027    Alcohol Level: No results for input(s): ETH in the last 168 hours.  Other results: EKG: normal sinus rhythm at 77 bpm.  Imaging: Ct Head Wo Contrast  10/02/2015  CLINICAL DATA:  Memory loss.  Code stroke. EXAM: CT HEAD WITHOUT CONTRAST TECHNIQUE: Contiguous axial images were obtained from the base of the skull through the vertex without intravenous contrast. COMPARISON:  None. FINDINGS: Ventricles and sulci are appropriate for patient's age. No evidence for acute large territory infarct, intracranial hemorrhage, mass lesion or mass-effect. Suggestion of increased density within the right MCA (image 14; series 2). Orbits are unremarkable. Paranasal sinuses are well aerated. Mastoid air cells are unremarkable. Calvarium  is intact. IMPRESSION: Suggestion of hyperdense right MCA. Cannot exclude thrombus. Recommend clinical correlation and MRI/ MRA as clinically indicated. No evidence for acute intracranial hemorrhage. Critical Value/emergent results were called by telephone at the time of interpretation on 10/02/2015 at 7:49 pm to Dr. Phineas Semen , who verbally acknowledged these results. Electronically Signed   By: Annia Belt M.D.   On: 10/02/2015 19:49   Mr Angiogram Neck W Wo Contrast  10/03/2015  CLINICAL DATA:  55 year old male with episode of severe memory loss yesterday. Initial encounter. EXAM: MRI HEAD WITHOUT CONTRAST MRA HEAD  WITHOUT CONTRAST MRA NECK WITHOUT AND WITH CONTRAST TECHNIQUE: Multiplanar, multiecho pulse sequences of the brain and surrounding structures were obtained without intravenous contrast. Angiographic images of the Circle of Willis were obtained using MRA technique without intravenous contrast. Angiographic images of the neck were obtained using MRA technique without and with intravenous contrast. Carotid stenosis measurements (when applicable) are obtained utilizing NASCET criteria, using the distal internal carotid diameter as the denominator. CONTRAST:  20mL MULTIHANCE GADOBENATE DIMEGLUMINE 529 MG/ML IV SOLN COMPARISON:  Noncontrast head CT 10/02/2015 FINDINGS: MRI HEAD FINDINGS Major intracranial vascular flow voids are preserved, the distal right vertebral artery appears dominant. No restricted diffusion or evidence of acute infarction. No midline shift, mass effect, evidence of mass lesion, ventriculomegaly, extra-axial collection or acute intracranial hemorrhage. Cervicomedullary junction and pituitary are within normal limits. Negative visualized cervical spine. Wallace Cullens and white matter signal is within normal limits for age throughout the brain. No cortical encephalomalacia or chronic cerebral blood products identified. Temporal lobe structures appear normal. Visible internal auditory structures appear normal. Visualized paranasal sinuses and mastoids are stable and well pneumatized. Negative orbit and scalp soft tissues. Normal bone marrow signal. MRA NECK FINDINGS Precontrast time-of-flight images demonstrate antegrade flow in both carotid and vertebral arteries in the neck. The right vertebral artery is dominant. The carotid bifurcations appear normal. Post-contrast neck MRA images reveal a 3 vessel arch configuration with no great vessel origin stenosis. Negative right CCA, right carotid bifurcation, and cervical right ICA. Negative left CCA. Minimal irregularity at the left carotid bifurcation and left ICA  origin compatible with atherosclerosis. No subsequent stenosis. Mildly tortuous cervical left ICA just distal to the bulb. No proximal subclavian artery stenosis. Mild irregularity at both vertebral artery origins does not appear hemodynamically significant. The right vertebral is dominant throughout with no significant stenosis. Negative left vertebral artery, which is mildly diminutive distal to the left PICA origin. MRA HEAD FINDINGS Antegrade flow in the posterior circulation with dominant right and diminutive distal left vertebral artery is. Normal PICA origins. Patent vertebrobasilar junction. No basilar stenosis. Normal SCA origins. Fetal type right PCA origin. Left posterior communicating artery diminutive or absent. Normal bilateral PCA branches. Antegrade flow in both ICA siphons. The right siphon appears mildly dominant. No siphon stenosis. Normal ophthalmic and right posterior communicating artery origins. Patent carotid termini. Dominant right ACA A1 segment. Normal MCA and ACA origins. Anterior communicating artery and visualized bilateral ACA branches are normal aside from mild tortuosity. MCA M1 segments are also somewhat tortuous, more so the right. MCA bifurcations and visualized bilateral MCA branches are within normal limits. IMPRESSION: 1. No acute intracranial abnormality. Normal for age noncontrast MRI appearance of the brain. 2. Negative neck MRA aside from mild left carotid and vertebral origin atherosclerosis. No hemodynamically significant stenosis identified. 3. Negative intracranial MRA aside from mild vessel tortuosity. Normal anatomic variants including fetal type right PCA origin an dominant right ACA A1 segment.  Electronically Signed   By: Odessa Fleming M.D.   On: 10/03/2015 10:47   Mr Brain Wo Contrast  10/03/2015  CLINICAL DATA:  55 year old male with episode of severe memory loss yesterday. Initial encounter. EXAM: MRI HEAD WITHOUT CONTRAST MRA HEAD WITHOUT CONTRAST MRA NECK WITHOUT  AND WITH CONTRAST TECHNIQUE: Multiplanar, multiecho pulse sequences of the brain and surrounding structures were obtained without intravenous contrast. Angiographic images of the Circle of Willis were obtained using MRA technique without intravenous contrast. Angiographic images of the neck were obtained using MRA technique without and with intravenous contrast. Carotid stenosis measurements (when applicable) are obtained utilizing NASCET criteria, using the distal internal carotid diameter as the denominator. CONTRAST:  20mL MULTIHANCE GADOBENATE DIMEGLUMINE 529 MG/ML IV SOLN COMPARISON:  Noncontrast head CT 10/02/2015 FINDINGS: MRI HEAD FINDINGS Major intracranial vascular flow voids are preserved, the distal right vertebral artery appears dominant. No restricted diffusion or evidence of acute infarction. No midline shift, mass effect, evidence of mass lesion, ventriculomegaly, extra-axial collection or acute intracranial hemorrhage. Cervicomedullary junction and pituitary are within normal limits. Negative visualized cervical spine. Wallace Cullens and white matter signal is within normal limits for age throughout the brain. No cortical encephalomalacia or chronic cerebral blood products identified. Temporal lobe structures appear normal. Visible internal auditory structures appear normal. Visualized paranasal sinuses and mastoids are stable and well pneumatized. Negative orbit and scalp soft tissues. Normal bone marrow signal. MRA NECK FINDINGS Precontrast time-of-flight images demonstrate antegrade flow in both carotid and vertebral arteries in the neck. The right vertebral artery is dominant. The carotid bifurcations appear normal. Post-contrast neck MRA images reveal a 3 vessel arch configuration with no great vessel origin stenosis. Negative right CCA, right carotid bifurcation, and cervical right ICA. Negative left CCA. Minimal irregularity at the left carotid bifurcation and left ICA origin compatible with  atherosclerosis. No subsequent stenosis. Mildly tortuous cervical left ICA just distal to the bulb. No proximal subclavian artery stenosis. Mild irregularity at both vertebral artery origins does not appear hemodynamically significant. The right vertebral is dominant throughout with no significant stenosis. Negative left vertebral artery, which is mildly diminutive distal to the left PICA origin. MRA HEAD FINDINGS Antegrade flow in the posterior circulation with dominant right and diminutive distal left vertebral artery is. Normal PICA origins. Patent vertebrobasilar junction. No basilar stenosis. Normal SCA origins. Fetal type right PCA origin. Left posterior communicating artery diminutive or absent. Normal bilateral PCA branches. Antegrade flow in both ICA siphons. The right siphon appears mildly dominant. No siphon stenosis. Normal ophthalmic and right posterior communicating artery origins. Patent carotid termini. Dominant right ACA A1 segment. Normal MCA and ACA origins. Anterior communicating artery and visualized bilateral ACA branches are normal aside from mild tortuosity. MCA M1 segments are also somewhat tortuous, more so the right. MCA bifurcations and visualized bilateral MCA branches are within normal limits. IMPRESSION: 1. No acute intracranial abnormality. Normal for age noncontrast MRI appearance of the brain. 2. Negative neck MRA aside from mild left carotid and vertebral origin atherosclerosis. No hemodynamically significant stenosis identified. 3. Negative intracranial MRA aside from mild vessel tortuosity. Normal anatomic variants including fetal type right PCA origin an dominant right ACA A1 segment. Electronically Signed   By: Odessa Fleming M.D.   On: 10/03/2015 10:47   Mr Maxine Glenn Head/brain Wo Cm  10/03/2015  CLINICAL DATA:  55 year old male with episode of severe memory loss yesterday. Initial encounter. EXAM: MRI HEAD WITHOUT CONTRAST MRA HEAD WITHOUT CONTRAST MRA NECK WITHOUT AND WITH CONTRAST  TECHNIQUE:  Multiplanar, multiecho pulse sequences of the brain and surrounding structures were obtained without intravenous contrast. Angiographic images of the Circle of Willis were obtained using MRA technique without intravenous contrast. Angiographic images of the neck were obtained using MRA technique without and with intravenous contrast. Carotid stenosis measurements (when applicable) are obtained utilizing NASCET criteria, using the distal internal carotid diameter as the denominator. CONTRAST:  20mL MULTIHANCE GADOBENATE DIMEGLUMINE 529 MG/ML IV SOLN COMPARISON:  Noncontrast head CT 10/02/2015 FINDINGS: MRI HEAD FINDINGS Major intracranial vascular flow voids are preserved, the distal right vertebral artery appears dominant. No restricted diffusion or evidence of acute infarction. No midline shift, mass effect, evidence of mass lesion, ventriculomegaly, extra-axial collection or acute intracranial hemorrhage. Cervicomedullary junction and pituitary are within normal limits. Negative visualized cervical spine. Wallace Cullens and white matter signal is within normal limits for age throughout the brain. No cortical encephalomalacia or chronic cerebral blood products identified. Temporal lobe structures appear normal. Visible internal auditory structures appear normal. Visualized paranasal sinuses and mastoids are stable and well pneumatized. Negative orbit and scalp soft tissues. Normal bone marrow signal. MRA NECK FINDINGS Precontrast time-of-flight images demonstrate antegrade flow in both carotid and vertebral arteries in the neck. The right vertebral artery is dominant. The carotid bifurcations appear normal. Post-contrast neck MRA images reveal a 3 vessel arch configuration with no great vessel origin stenosis. Negative right CCA, right carotid bifurcation, and cervical right ICA. Negative left CCA. Minimal irregularity at the left carotid bifurcation and left ICA origin compatible with atherosclerosis. No  subsequent stenosis. Mildly tortuous cervical left ICA just distal to the bulb. No proximal subclavian artery stenosis. Mild irregularity at both vertebral artery origins does not appear hemodynamically significant. The right vertebral is dominant throughout with no significant stenosis. Negative left vertebral artery, which is mildly diminutive distal to the left PICA origin. MRA HEAD FINDINGS Antegrade flow in the posterior circulation with dominant right and diminutive distal left vertebral artery is. Normal PICA origins. Patent vertebrobasilar junction. No basilar stenosis. Normal SCA origins. Fetal type right PCA origin. Left posterior communicating artery diminutive or absent. Normal bilateral PCA branches. Antegrade flow in both ICA siphons. The right siphon appears mildly dominant. No siphon stenosis. Normal ophthalmic and right posterior communicating artery origins. Patent carotid termini. Dominant right ACA A1 segment. Normal MCA and ACA origins. Anterior communicating artery and visualized bilateral ACA branches are normal aside from mild tortuosity. MCA M1 segments are also somewhat tortuous, more so the right. MCA bifurcations and visualized bilateral MCA branches are within normal limits. IMPRESSION: 1. No acute intracranial abnormality. Normal for age noncontrast MRI appearance of the brain. 2. Negative neck MRA aside from mild left carotid and vertebral origin atherosclerosis. No hemodynamically significant stenosis identified. 3. Negative intracranial MRA aside from mild vessel tortuosity. Normal anatomic variants including fetal type right PCA origin an dominant right ACA A1 segment. Electronically Signed   By: Odessa Fleming M.D.   On: 10/03/2015 10:47     Assessment/Plan: 55 year old male presenting with symptoms suggestive of an episode of TGA.  Neurological examinaiton is nonfocal.  MRI of the brain has been personally reviewed and shows no acute changes.  MRA is unremarkable.  Diagnosis and  prognosis explained to patient and family at length.    Recommendations: 1.  Patient stable for discharge.  May discuss the need for EEG with PCP as an outpatient.   2.  ASA 81mg  daily    Thana Farr, MD Neurology [phone] 10/03/2015, 2:45 PM

## 2019-11-19 ENCOUNTER — Inpatient Hospital Stay
Admission: EM | Admit: 2019-11-19 | Discharge: 2020-01-18 | DRG: 207 | Disposition: E | Payer: HRSA Program | Attending: Pulmonary Disease | Admitting: Pulmonary Disease

## 2019-11-19 ENCOUNTER — Encounter: Payer: Self-pay | Admitting: Emergency Medicine

## 2019-11-19 ENCOUNTER — Other Ambulatory Visit: Payer: Self-pay

## 2019-11-19 ENCOUNTER — Emergency Department: Payer: HRSA Program

## 2019-11-19 DIAGNOSIS — Z7982 Long term (current) use of aspirin: Secondary | ICD-10-CM

## 2019-11-19 DIAGNOSIS — R7301 Impaired fasting glucose: Secondary | ICD-10-CM | POA: Diagnosis not present

## 2019-11-19 DIAGNOSIS — J9690 Respiratory failure, unspecified, unspecified whether with hypoxia or hypercapnia: Secondary | ICD-10-CM

## 2019-11-19 DIAGNOSIS — D696 Thrombocytopenia, unspecified: Secondary | ICD-10-CM | POA: Diagnosis not present

## 2019-11-19 DIAGNOSIS — N179 Acute kidney failure, unspecified: Secondary | ICD-10-CM | POA: Diagnosis not present

## 2019-11-19 DIAGNOSIS — J1282 Pneumonia due to coronavirus disease 2019: Secondary | ICD-10-CM

## 2019-11-19 DIAGNOSIS — G4700 Insomnia, unspecified: Secondary | ICD-10-CM | POA: Diagnosis not present

## 2019-11-19 DIAGNOSIS — E871 Hypo-osmolality and hyponatremia: Secondary | ICD-10-CM | POA: Diagnosis present

## 2019-11-19 DIAGNOSIS — R739 Hyperglycemia, unspecified: Secondary | ICD-10-CM

## 2019-11-19 DIAGNOSIS — N5089 Other specified disorders of the male genital organs: Secondary | ICD-10-CM | POA: Diagnosis not present

## 2019-11-19 DIAGNOSIS — R1084 Generalized abdominal pain: Secondary | ICD-10-CM

## 2019-11-19 DIAGNOSIS — E870 Hyperosmolality and hypernatremia: Secondary | ICD-10-CM | POA: Diagnosis present

## 2019-11-19 DIAGNOSIS — G8929 Other chronic pain: Secondary | ICD-10-CM | POA: Diagnosis present

## 2019-11-19 DIAGNOSIS — D638 Anemia in other chronic diseases classified elsewhere: Secondary | ICD-10-CM | POA: Diagnosis not present

## 2019-11-19 DIAGNOSIS — Z9049 Acquired absence of other specified parts of digestive tract: Secondary | ICD-10-CM

## 2019-11-19 DIAGNOSIS — G928 Other toxic encephalopathy: Secondary | ICD-10-CM | POA: Diagnosis not present

## 2019-11-19 DIAGNOSIS — I214 Non-ST elevation (NSTEMI) myocardial infarction: Secondary | ICD-10-CM | POA: Diagnosis not present

## 2019-11-19 DIAGNOSIS — K436 Other and unspecified ventral hernia with obstruction, without gangrene: Secondary | ICD-10-CM | POA: Diagnosis present

## 2019-11-19 DIAGNOSIS — U071 COVID-19: Principal | ICD-10-CM | POA: Diagnosis present

## 2019-11-19 DIAGNOSIS — A4189 Other specified sepsis: Secondary | ICD-10-CM | POA: Diagnosis not present

## 2019-11-19 DIAGNOSIS — Z5329 Procedure and treatment not carried out because of patient's decision for other reasons: Secondary | ICD-10-CM | POA: Diagnosis not present

## 2019-11-19 DIAGNOSIS — J9600 Acute respiratory failure, unspecified whether with hypoxia or hypercapnia: Secondary | ICD-10-CM

## 2019-11-19 DIAGNOSIS — L89896 Pressure-induced deep tissue damage of other site: Secondary | ICD-10-CM | POA: Diagnosis not present

## 2019-11-19 DIAGNOSIS — E8809 Other disorders of plasma-protein metabolism, not elsewhere classified: Secondary | ICD-10-CM | POA: Diagnosis not present

## 2019-11-19 DIAGNOSIS — E43 Unspecified severe protein-calorie malnutrition: Secondary | ICD-10-CM | POA: Diagnosis present

## 2019-11-19 DIAGNOSIS — L89892 Pressure ulcer of other site, stage 2: Secondary | ICD-10-CM | POA: Diagnosis not present

## 2019-11-19 DIAGNOSIS — R0602 Shortness of breath: Secondary | ICD-10-CM

## 2019-11-19 DIAGNOSIS — Z5900 Homelessness unspecified: Secondary | ICD-10-CM

## 2019-11-19 DIAGNOSIS — J982 Interstitial emphysema: Secondary | ICD-10-CM | POA: Diagnosis not present

## 2019-11-19 DIAGNOSIS — Z4659 Encounter for fitting and adjustment of other gastrointestinal appliance and device: Secondary | ICD-10-CM

## 2019-11-19 DIAGNOSIS — R6521 Severe sepsis with septic shock: Secondary | ICD-10-CM | POA: Diagnosis not present

## 2019-11-19 DIAGNOSIS — Z515 Encounter for palliative care: Secondary | ICD-10-CM | POA: Diagnosis not present

## 2019-11-19 DIAGNOSIS — L8990 Pressure ulcer of unspecified site, unspecified stage: Secondary | ICD-10-CM | POA: Insufficient documentation

## 2019-11-19 DIAGNOSIS — R1110 Vomiting, unspecified: Secondary | ICD-10-CM

## 2019-11-19 DIAGNOSIS — Z978 Presence of other specified devices: Secondary | ICD-10-CM

## 2019-11-19 DIAGNOSIS — J80 Acute respiratory distress syndrome: Secondary | ICD-10-CM | POA: Diagnosis present

## 2019-11-19 DIAGNOSIS — R0902 Hypoxemia: Secondary | ICD-10-CM

## 2019-11-19 DIAGNOSIS — R7989 Other specified abnormal findings of blood chemistry: Secondary | ICD-10-CM

## 2019-11-19 DIAGNOSIS — Z0189 Encounter for other specified special examinations: Secondary | ICD-10-CM

## 2019-11-19 DIAGNOSIS — R7401 Elevation of levels of liver transaminase levels: Secondary | ICD-10-CM | POA: Diagnosis present

## 2019-11-19 DIAGNOSIS — J8410 Pulmonary fibrosis, unspecified: Secondary | ICD-10-CM | POA: Diagnosis not present

## 2019-11-19 DIAGNOSIS — R579 Shock, unspecified: Secondary | ICD-10-CM

## 2019-11-19 DIAGNOSIS — Z66 Do not resuscitate: Secondary | ICD-10-CM | POA: Diagnosis not present

## 2019-11-19 DIAGNOSIS — Z01818 Encounter for other preprocedural examination: Secondary | ICD-10-CM

## 2019-11-19 DIAGNOSIS — R5081 Fever presenting with conditions classified elsewhere: Secondary | ICD-10-CM

## 2019-11-19 DIAGNOSIS — R531 Weakness: Secondary | ICD-10-CM

## 2019-11-19 DIAGNOSIS — Z8249 Family history of ischemic heart disease and other diseases of the circulatory system: Secondary | ICD-10-CM

## 2019-11-19 DIAGNOSIS — D6489 Other specified anemias: Secondary | ICD-10-CM | POA: Diagnosis present

## 2019-11-19 DIAGNOSIS — J9601 Acute respiratory failure with hypoxia: Secondary | ICD-10-CM

## 2019-11-19 DIAGNOSIS — K430 Incisional hernia with obstruction, without gangrene: Secondary | ICD-10-CM | POA: Diagnosis present

## 2019-11-19 DIAGNOSIS — Z6831 Body mass index (BMI) 31.0-31.9, adult: Secondary | ICD-10-CM

## 2019-11-19 DIAGNOSIS — L89812 Pressure ulcer of head, stage 2: Secondary | ICD-10-CM | POA: Diagnosis not present

## 2019-11-19 DIAGNOSIS — E669 Obesity, unspecified: Secondary | ICD-10-CM | POA: Diagnosis present

## 2019-11-19 LAB — URINALYSIS, COMPLETE (UACMP) WITH MICROSCOPIC
Bacteria, UA: NONE SEEN
Bilirubin Urine: NEGATIVE
Glucose, UA: NEGATIVE mg/dL
Ketones, ur: NEGATIVE mg/dL
Leukocytes,Ua: NEGATIVE
Nitrite: NEGATIVE
Protein, ur: NEGATIVE mg/dL
Specific Gravity, Urine: 1.002 — ABNORMAL LOW (ref 1.005–1.030)
Squamous Epithelial / LPF: NONE SEEN (ref 0–5)
pH: 7 (ref 5.0–8.0)

## 2019-11-19 LAB — CBC WITH DIFFERENTIAL/PLATELET
Abs Immature Granulocytes: 0.09 10*3/uL — ABNORMAL HIGH (ref 0.00–0.07)
Basophils Absolute: 0 10*3/uL (ref 0.0–0.1)
Basophils Relative: 0 %
Eosinophils Absolute: 0 10*3/uL (ref 0.0–0.5)
Eosinophils Relative: 0 %
HCT: 41 % (ref 39.0–52.0)
Hemoglobin: 14.8 g/dL (ref 13.0–17.0)
Immature Granulocytes: 2 %
Lymphocytes Relative: 9 %
Lymphs Abs: 0.5 10*3/uL — ABNORMAL LOW (ref 0.7–4.0)
MCH: 29.4 pg (ref 26.0–34.0)
MCHC: 36.1 g/dL — ABNORMAL HIGH (ref 30.0–36.0)
MCV: 81.3 fL (ref 80.0–100.0)
Monocytes Absolute: 0.4 10*3/uL (ref 0.1–1.0)
Monocytes Relative: 7 %
Neutro Abs: 4.5 10*3/uL (ref 1.7–7.7)
Neutrophils Relative %: 82 %
Platelets: 229 10*3/uL (ref 150–400)
RBC: 5.04 MIL/uL (ref 4.22–5.81)
RDW: 14.6 % (ref 11.5–15.5)
WBC: 5.4 10*3/uL (ref 4.0–10.5)
nRBC: 0 % (ref 0.0–0.2)

## 2019-11-19 LAB — COMPREHENSIVE METABOLIC PANEL
ALT: 72 U/L — ABNORMAL HIGH (ref 0–44)
AST: 98 U/L — ABNORMAL HIGH (ref 15–41)
Albumin: 3.1 g/dL — ABNORMAL LOW (ref 3.5–5.0)
Alkaline Phosphatase: 62 U/L (ref 38–126)
Anion gap: 11 (ref 5–15)
BUN: 10 mg/dL (ref 8–23)
CO2: 22 mmol/L (ref 22–32)
Calcium: 8.1 mg/dL — ABNORMAL LOW (ref 8.9–10.3)
Chloride: 93 mmol/L — ABNORMAL LOW (ref 98–111)
Creatinine, Ser: 0.9 mg/dL (ref 0.61–1.24)
GFR calc Af Amer: >60 mL/min (ref >60)
GFR calc non Af Amer: >60 mL/min (ref >60)
Glucose, Bld: 138 mg/dL — ABNORMAL HIGH (ref 70–99)
Potassium: 3.7 mmol/L (ref 3.5–5.1)
Sodium: 126 mmol/L — ABNORMAL LOW (ref 135–145)
Total Bilirubin: 1.3 mg/dL — ABNORMAL HIGH (ref 0.3–1.2)
Total Protein: 7.1 g/dL (ref 6.5–8.1)

## 2019-11-19 LAB — LACTIC ACID, PLASMA: Lactic Acid, Venous: 1.5 mmol/L (ref 0.5–1.9)

## 2019-11-19 LAB — ABO/RH: ABO/RH(D): A POS

## 2019-11-19 LAB — SARS CORONAVIRUS 2 BY RT PCR (HOSPITAL ORDER, PERFORMED IN ~~LOC~~ HOSPITAL LAB): SARS Coronavirus 2: POSITIVE — AB

## 2019-11-19 MED ORDER — SODIUM CHLORIDE 0.9 % IV SOLN: 100.0000 mg | Freq: Every day | INTRAVENOUS | Status: DC

## 2019-11-19 MED ORDER — METHYLPREDNISOLONE SODIUM SUCC 40 MG IJ SOLR
0.2500 mg/kg | Freq: Two times a day (BID) | INTRAMUSCULAR | Status: DC
  Administered 2019-11-19 – 2019-11-21 (×4): 26 mg via INTRAVENOUS
  Filled 2019-11-19 (×4): qty 1

## 2019-11-19 MED ORDER — ZINC SULFATE 220 (50 ZN) MG PO CAPS
220.0000 mg | ORAL_CAPSULE | Freq: Every day | ORAL | Status: DC
  Administered 2019-11-19 – 2019-12-02 (×14): 220 mg via ORAL
  Filled 2019-11-19 (×14): qty 1

## 2019-11-19 MED ORDER — ALBUTEROL SULFATE HFA 108 (90 BASE) MCG/ACT IN AERS
2.0000 | INHALATION_SPRAY | Freq: Four times a day (QID) | RESPIRATORY_TRACT | Status: DC
  Administered 2019-11-19 – 2019-12-01 (×47): 2 via RESPIRATORY_TRACT
  Filled 2019-11-19 (×2): qty 6.7

## 2019-11-19 MED ORDER — SODIUM CHLORIDE 0.9 % IV SOLN
200.0000 mg | Freq: Once | INTRAVENOUS | Status: DC
  Filled 2019-11-19: qty 40

## 2019-11-19 MED ORDER — SODIUM CHLORIDE 0.9 % IV SOLN
200.0000 mg | Freq: Once | INTRAVENOUS | Status: AC
  Administered 2019-11-19: 200 mg via INTRAVENOUS
  Filled 2019-11-19: qty 200

## 2019-11-19 MED ORDER — ASCORBIC ACID 500 MG PO TABS
500.0000 mg | ORAL_TABLET | Freq: Every day | ORAL | Status: DC
  Administered 2019-11-20 – 2019-12-01 (×12): 500 mg via ORAL
  Filled 2019-11-19 (×14): qty 1

## 2019-11-19 MED ORDER — GUAIFENESIN-DM 100-10 MG/5ML PO SYRP
10.0000 mL | ORAL_SOLUTION | ORAL | Status: DC | PRN
  Administered 2019-11-19 – 2019-11-24 (×2): 10 mL via ORAL
  Filled 2019-11-19 (×4): qty 10

## 2019-11-19 MED ORDER — SODIUM CHLORIDE 0.9 % IV SOLN
100.0000 mg | Freq: Every day | INTRAVENOUS | Status: AC
  Administered 2019-11-20 – 2019-11-23 (×4): 100 mg via INTRAVENOUS
  Filled 2019-11-19 (×5): qty 20

## 2019-11-19 MED ORDER — DEXAMETHASONE SODIUM PHOSPHATE 10 MG/ML IJ SOLN
6.0000 mg | Freq: Once | INTRAMUSCULAR | Status: AC
  Administered 2019-11-19: 6 mg via INTRAVENOUS
  Filled 2019-11-19: qty 1

## 2019-11-19 MED ORDER — MORPHINE SULFATE (PF) 4 MG/ML IV SOLN
6.0000 mg | Freq: Once | INTRAVENOUS | Status: AC
  Administered 2019-11-19: 6 mg via INTRAVENOUS
  Filled 2019-11-19: qty 2

## 2019-11-19 MED ORDER — ACETAMINOPHEN 325 MG PO TABS: 650.0000 mg | ORAL_TABLET | Freq: Four times a day (QID) | ORAL | Status: DC | PRN

## 2019-11-19 MED ORDER — SODIUM CHLORIDE 0.9 % IV SOLN
250.0000 mL | INTRAVENOUS | Status: DC | PRN
  Administered 2019-11-22 – 2019-12-11 (×5): 250 mL via INTRAVENOUS

## 2019-11-19 MED ORDER — SODIUM CHLORIDE 0.9 % IV SOLN: INTRAVENOUS | Status: DC

## 2019-11-19 MED ORDER — ONDANSETRON HCL 4 MG PO TABS: 4.0000 mg | ORAL_TABLET | Freq: Four times a day (QID) | ORAL | Status: DC | PRN

## 2019-11-19 MED ORDER — SODIUM CHLORIDE 0.9% FLUSH
3.0000 mL | Freq: Two times a day (BID) | INTRAVENOUS | Status: DC
  Administered 2019-11-20 – 2019-12-11 (×40): 3 mL via INTRAVENOUS

## 2019-11-19 MED ORDER — LACTATED RINGERS IV BOLUS
1000.0000 mL | Freq: Once | INTRAVENOUS | Status: AC
  Administered 2019-11-19: 1000 mL via INTRAVENOUS

## 2019-11-19 MED ORDER — ONDANSETRON HCL 4 MG/2ML IJ SOLN: 4.0000 mg | Freq: Four times a day (QID) | INTRAMUSCULAR | Status: DC | PRN

## 2019-11-19 MED ORDER — SODIUM CHLORIDE 0.9% FLUSH: 3.0000 mL | INTRAVENOUS | Status: DC | PRN

## 2019-11-19 NOTE — ED Notes (Signed)
Report given to Amanda, RN

## 2019-11-19 NOTE — ED Provider Notes (Signed)
[facility] Emergency Department Provider Note ____________________________________________   First MD Initiated Contact with Patient 11/18/2019 [phone]     (approximate)  I have reviewed the triage vital signs and the nursing notes.  HISTORY  Chief Complaint Abdominal Pain   HPI Rick Lowery is a 59 y.o. malewho presents to the ED for evaluation of abdominal pain.  Chart review indicates no relevant medical history. Patient self-reports a history of cholecystectomy and right inguinal hernia repair as only abdominal surgeries.   Patient presents to the ED with complaints of 4 hours of periumbilical abdominal pain.  Patient reports concern that his "hernia is messed up."  Patient reports he was feeling well until he had sudden onset abdominal pain.  He reports associated nausea without vomiting.  Denies diarrhea.  Reports voiding without hematuria or dysuria.  Patient reporting 10/10 aching abdominal pain that is constant.  Has not taken medications for this pain.  Does not radiate.  Patient noted to be febrile in triage.  Patient denies recent fevers or illnesses.  Patient is not vaccinated for COVID-19, and denies known sick contacts.  No recent antibiotic use.  History is limited due to acuity of condition upon presentation.  Past Medical History:  Diagnosis Date  . Chronic neck and back pain     Patient Active Problem List   Diagnosis Date Noted  . Pneumonia due to COVID-19 virus 12/15/2019  . Acute respiratory failure due to COVID-19 (HCC) 11/26/2019  . Incarcerated ventral hernia 12/17/2019  . Amnesia 10/02/2015    Past Surgical History:  Procedure Laterality Date  . CHOLECYSTECTOMY      Prior to Admission medications   Medication Sig Start Date End Date Taking? Authorizing Provider  aspirin EC 81 MG tablet Take 1 tablet (81 mg total) by mouth daily. 10/03/15   Auburn Bilberry, MD    Allergies Patient has no known allergies.  Family  History  Problem Relation Age of Onset  . Stroke Father   . Diabetes Mother   . Heart disease Mother     Social History Social History   Tobacco Use  . Smoking status: Never Smoker  . Smokeless tobacco: Never Used  Substance Use Topics  . Alcohol use: No  . Drug use: No    Review of Systems  Constitutional: Positive for subjective fevers Eyes: No visual changes. ENT: No sore throat. Cardiovascular: Denies chest pain. Respiratory: Denies shortness of breath. Gastrointestinal: Positive for abdominal pain and nausea.  No vomiting.  No diarrhea.  No constipation. Genitourinary: Negative for dysuria. Musculoskeletal: Negative for back pain. Skin: Negative for rash. Neurological: Negative for headaches, focal weakness or numbness.  ____________________________________________   PHYSICAL EXAM:  VITAL SIGNS: Vitals:   11/18/2019 0828 12/09/2019 0901  BP:  109/65  Pulse:  71  Resp:  (!) 29  Temp:    SpO2: 92% 97%      Constitutional: Alert and oriented.  Uncomfortable-appearing and clutching his mid abdomen. Warm to the touch.  Eyes: Conjunctivae are normal. PERRL. EOMI. Head: Atraumatic. Nose: No congestion/rhinnorhea. Mouth/Throat: Mucous membranes are moist.  Oropharynx non-erythematous. Neck: No stridor. No cervical spine tenderness to palpation. Cardiovascular: Normal rate, regular rhythm. Grossly normal heart sounds.  Good peripheral circulation. Respiratory: Tachypneic to the mid 20s on NRB.  No retractions. Lungs CTAB. Gastrointestinal: . No abdominal bruits. No CVA tenderness. Obvious soft tissue prominence to his epigastrium/just superior to his umbilicus.  Patient is clutching this area.  Firm to palpation and concerning for  incarcerated ventral hernia.  Exquisitely tender to palpation.  Abdomen is otherwise soft and without peritoneal features to his lower quadrants bilaterally. Musculoskeletal: No lower extremity tenderness nor edema.  No joint effusions. No  signs of acute trauma. Neurologic:  Normal speech and language. No gross focal neurologic deficits are appreciated. No gait instability noted. Skin:  Skin is warm, dry and intact. No rash noted. Psychiatric: Mood and affect are normal. Speech and behavior are normal.  ____________________________________________   LABS (all labs ordered are listed, but only abnormal results are displayed)  Labs Reviewed  SARS CORONAVIRUS 2 BY RT PCR (HOSPITAL ORDER, PERFORMED IN [facility]) - Abnormal; Notable for the following components:      Result Value   SARS Coronavirus 2 POSITIVE (*)    All other components within normal limits  CBC WITH DIFFERENTIAL/PLATELET - Abnormal; Notable for the following components:   MCHC 36.1 (*)    Lymphs Abs 0.5 (*)    Abs Immature Granulocytes 0.09 (*)    All other components within normal limits  COMPREHENSIVE METABOLIC PANEL - Abnormal; Notable for the following components:   Sodium 126 (*)    Chloride 93 (*)    Glucose, Bld 138 (*)    Calcium 8.1 (*)    Albumin 3.1 (*)    AST 98 (*)    ALT 72 (*)    Total Bilirubin 1.3 (*)    All other components within normal limits  URINALYSIS, COMPLETE (UACMP) WITH MICROSCOPIC - Abnormal; Notable for the following components:   Color, Urine STRAW (*)    APPearance CLEAR (*)    Specific Gravity, Urine 1.002 (*)    Hgb urine dipstick SMALL (*)    All other components within normal limits  LACTIC ACID, PLASMA  HIV ANTIBODY (ROUTINE TESTING W REFLEX)  ABO/RH   ____________________________________________  12 Lead EKG   ____________________________________________  RADIOLOGY  ED MD interpretation: Patchy multifocal airspace opacities throughout  Official radiology report(s): DG Chest Portable 1 View  Result Date: 12/03/2019 CLINICAL DATA:  Hypoxia and fever EXAM: PORTABLE CHEST 1 VIEW COMPARISON:  None. FINDINGS: Patchy bilateral airspace disease. Low lung volumes. Prominent heart size,  accentuated by low volume chest. No visible effusion or pneumothorax. IMPRESSION: Atypical pneumonia pattern with low lung volumes. Electronically Signed   By: Marnee Spring M.D.   On: 11/22/2019 08:48   ____________________________________________   PROCEDURES and INTERVENTIONS  Procedure(s) performed (including Critical Care):  Hernia reduction  Date/Time: 12/16/2019 10:24 AM Performed by: Delton Prairie, MD Authorized by: Delton Prairie, MD  Consent: Verbal consent obtained. Local anesthesia used: no  Anesthesia: Local anesthesia used: no  Sedation: Patient sedated: morphine.  Patient tolerance: patient tolerated the procedure well with no immediate complications Comments: Patient premedicated with 6 mg of morphine. Walked the patient through calming exercises to slow his breathing and relax abdominal muscles.  Able to reduce ventral abdominal hernia on my first attempt with resolution of his abdominal pain.  Well-tolerated.  No immediate complications.  [name] Kitchen1-3 Lead EKG Interpretation Performed by: Delton Prairie, MD Authorized by: Delton Prairie, MD     Interpretation: normal     ECG rate:  75   ECG rate assessment: normal     Rhythm: sinus rhythm     Ectopy: none     Conduction: normal   .Critical Care Performed by: Delton Prairie, MD Authorized by: Delton Prairie, MD   Critical care provider statement:    Critical care time (minutes):  30  Critical care was necessary to treat or prevent imminent or life-threatening deterioration of the following conditions:  Respiratory failure   Critical care was time spent personally by me on the following activities:  Discussions with consultants, evaluation of patient's response to treatment, examination of patient, ordering and performing treatments and interventions, ordering and review of laboratory studies, ordering and review of radiographic studies, pulse oximetry, re-evaluation of patient's condition, obtaining history from patient or  surrogate and review of old charts    Medications  dexamethasone (DECADRON) injection 6 mg (has no administration in time range)  sodium chloride flush (NS) 0.9 % injection 3 mL (has no administration in time range)  sodium chloride flush (NS) 0.9 % injection 3 mL (has no administration in time range)  0.9 %  sodium chloride infusion (has no administration in time range)  remdesivir 200 mg in sodium chloride 0.9% 250 mL IVPB (has no administration in time range)    Followed by  remdesivir 100 mg in sodium chloride 0.9 % 100 mL IVPB (has no administration in time range)  albuterol (VENTOLIN HFA) 108 (90 Base) MCG/ACT inhaler 2 puff (has no administration in time range)  methylPREDNISolone sodium succinate (SOLU-MEDROL) 40 mg/mL injection 26 mg (has no administration in time range)  guaiFENesin-dextromethorphan (ROBITUSSIN DM) 100-10 MG/5ML syrup 10 mL (has no administration in time range)  ascorbic acid (VITAMIN C) tablet 500 mg (has no administration in time range)  zinc sulfate capsule 220 mg (has no administration in time range)  acetaminophen (TYLENOL) tablet 650 mg (has no administration in time range)  ondansetron (ZOFRAN) tablet 4 mg (has no administration in time range)    Or  ondansetron (ZOFRAN) injection 4 mg (has no administration in time range)  0.9 %  sodium chloride infusion (has no administration in time range)  morphine 4 MG/ML injection 6 mg (6 mg Intravenous Given 12/14/2019 0822)  lactated ringers bolus 1,000 mL (0 mLs Intravenous Stopped 12/08/2019 0945)    ____________________________________________   MDM / ED COURSE  59 year old male presenting from home with abdominal pain found to have an incarcerated ventral hernia able to be reduced at the bedside, subsequently becoming hypoxic and noted to be positive for COVID-19 requiring medical admission for this.  Patient with low-grade fever and 90% oxygen saturation on room air in triage, but noted to desaturate to 78% on  room air once roomed and requiring NRB.  Hemodynamically stable throughout.  Exam initially with the patient clutching his abdomen overlying an area of obvious incarcerated ventral hernia.  This was quickly reduced at the bedside after administration of morphine, and resolution of his presenting abdominal pain.  Unfortunately, noted to be quite hypoxic requiring NRB.  Patient is not vaccinated for COVID-19, and I have a high suspicion for this.  This test ultimately returns positive, and so patient was started on remdesivir and Decadron.  CXR without lobar consolidation or signs of bacterial pneumonia to warrant antibiotic administration.  Canceled CT abdomen/pelvis due to resolution of his incarcerated hernia and my concern for unnecessarily spreading COVID-19 viral particles.  Consulted general surgery who will evaluate the patient due to his presenting complaints, but we will admit the patient to hospitalist medicine for further work-up and management of his hypoxic Covid status.  Clinical Course as of Nov 19 1030  Thu Nov 19, 2019  [id number] RN calls me and informs me of hypoxia.  Patient is reportedly in the 70s on room air.  I immediately reassessed the patient and note hypoxia  of 82% with a good waveform on nasal cannula. Patient was down to 78% on room air.  Patient indicates that he has not been vaccinated for COVID-19, no known sick contacts.   [DS]  7062 Reevaluated after morphine administration.  Patient is now on a nonrebreather with sats of 92%.  I was able to reduce his ventral abdominal hernia with resolution of his abdominal pain.  Advised patient of my concern of his hypoxia and the likelihood for COVID-19.  Called CT and canceled CT abdomen/pelvis, and add a chest x-ray.   [DS]  3762 Reassessed.  Sats are 96% on nonrebreather.  Abdominal exam is reassuring and continues to be benign and soft, he denies pain.  Awaiting Covid swab.  Educated patient on my concern for significant hypoxia,  likelihood of COVID-19 and likelihood of medical admission.  Patient is surprised by this, but is amenable to staying if needed   [DS]  0932 Lab called with positive Covid results.  Decadron and remdesivir ordered.  Due to patient looking so well, no need for BiPAP, but will try to settle him out on high flow nasal cannula   [DS]  1006 Spoke with Dr. Joylene Igo, hospitalist on call, who agrees to admit the patient. She also requests that I speak with general surgery regarding his initial hernia   [DS]  1008 Dr. Aleen Campi paged   [DS]  1026 Spoke with Dr. Aleen Campi, who will evaluate the patient.   [DS]    Clinical Course User Index [DS] Delton Prairie, MD     ____________________________________________   FINAL CLINICAL IMPRESSION(S) / ED DIAGNOSES  Final diagnoses:  Incarcerated ventral hernia  Fever in other diseases  Generalized abdominal pain  Hypoxia     ED Discharge Orders    None       Rick Lowery   Note:  This document was prepared using Dragon voice recognition software and may include unintentional dictation errors.   Delton Prairie, MD 12-01-19 1038

## 2019-11-19 NOTE — ED Notes (Signed)
Pt placed on NRB at this time.

## 2019-11-19 NOTE — ED Notes (Signed)
Date and time results received: 11/27/2019 9:35 AM  (use smartphrase ".now" to insert current time)  Test: Covid Critical Value: Positive  Name of Provider Notified: Dr. Adaline Sill  Orders Received? Or Actions Taken?: Orders Received, see orders

## 2019-11-19 NOTE — Consult Note (Signed)
Date of Consultation:  11-22-19  Requesting Physician:  Delton Prairie, MD  Reason for Consultation:  Ventral hernia  History of Present Illness: Rick Lowery is a 59 y.o. male presenting with acute onset of upper abdominal pain just a few hours prior to presentation.  He was also found to have hypoxia and respiratory distress and he has tested positive for COVID.  Patient reports a history of laparoscopic cholecystectomy and right inguinal hernia repair.  He reports having a hernia int he upper abdomen for about 4-5 years now, and has been otherwise asymptomatic.  He reports the hernia is usually bulging out, and he does not try to push it in.  But otherwise it causes not pain.  Denies any issues with po intake, nausea, vomiting, abdominal pain with eating, constipation, diarrhea, or distention.  However, earlier today, his pain was unbearable, 10 of 10.  Dr. Domingo Dimes in the ED evaluated him and was found to have an incarcerated ventral hernia.  This was successfully reduced at bedside and after that, the patient reported improvement to resolution of his pain.    Due to him being COVID positive with respiratory distress, he's being admitted for treatment, and surgery is consulted for evaluation of his hernia.  Currently, the patient denies any abdominal pain.  Past Medical History: Past Medical History:  Diagnosis Date  . Chronic neck and back pain      Past Surgical History: Past Surgical History:  Procedure Laterality Date  . CHOLECYSTECTOMY      Home Medications: Prior to Admission medications   Medication Sig Start Date End Date Taking? Authorizing Provider  aspirin EC 81 MG tablet Take 1 tablet (81 mg total) by mouth daily. Patient not taking: Reported on 2019-11-22 10/03/15   Auburn Bilberry, MD    Allergies: No Known Allergies  Social History:  reports that he has never smoked. He has never used smokeless tobacco. He reports that he does not drink alcohol and does not use  drugs.   Family History: Family History  Problem Relation Age of Onset  . Stroke Father   . Diabetes Mother   . Heart disease Mother     Review of Systems: Review of Systems  Constitutional: Negative for chills and fever.  HENT: Negative for hearing loss.   Respiratory: Positive for shortness of breath.   Cardiovascular: Negative for chest pain.  Gastrointestinal: Positive for abdominal pain. Negative for constipation, diarrhea, nausea and vomiting.  Genitourinary: Negative for dysuria.  Musculoskeletal: Negative for myalgias.  Skin: Negative for rash.  Neurological: Negative for dizziness.  Psychiatric/Behavioral: Negative for depression.    Physical Exam BP 116/70   Pulse 79   Temp (!) 100.4 F (38 C) (Oral)   Resp (!) 25   Ht 5\' 8"  (1.727 m)   Wt 104.3 kg   SpO2 90%   BMI 34.97 kg/m  CONSTITUTIONAL: No acute distress, though has nasal cannula and his O2 sat is 91% HEENT:  Normocephalic, atraumatic, extraocular motion intact. NECK: Trachea is midline, and there is no jugular venous distension. RESPIRATORY:  Normal respiratory effort without pathologic use of accessory muscles. CARDIOVASCULAR: Heart is regular without murmurs, gallops, or rubs. GI: The abdomen is soft, non-distended, currently non-tender to palpation.  Patient has laparoscopic incisions well healed.  The subxiphoid incision has a 3 cm incisional hernia defect, but currently is reduced, with only some soreness to deep palpation.  MUSCULOSKELETAL:  Normal muscle strength and tone in all four extremities.  No peripheral edema or  cyanosis. SKIN: Skin turgor is normal. There are no pathologic skin lesions.  NEUROLOGIC:  Motor and sensation is grossly normal.  Cranial nerves are grossly intact. PSYCH:  Alert and oriented to person, place and time. Affect is normal.  Laboratory Analysis: Results for orders placed or performed during the hospital encounter of 12/09/19 (from the past 24 hour(s))  CBC with  Differential     Status: Abnormal   Collection Time: 12-09-2019  7:59 AM  Result Value Ref Range   WBC 5.4 4.0 - 10.5 K/uL   RBC 5.04 4.22 - 5.81 MIL/uL   Hemoglobin 14.8 13.0 - 17.0 g/dL   HCT 95.2 39 - 52 %   MCV 81.3 80.0 - 100.0 fL   MCH 29.4 26.0 - 34.0 pg   MCHC 36.1 (H) 30.0 - 36.0 g/dL   RDW 84.1 32.4 - 40.1 %   Platelets 229 150 - 400 K/uL   nRBC 0.0 0.0 - 0.2 %   Neutrophils Relative % 82 %   Neutro Abs 4.5 1.7 - 7.7 K/uL   Lymphocytes Relative 9 %   Lymphs Abs 0.5 (L) 0.7 - 4.0 K/uL   Monocytes Relative 7 %   Monocytes Absolute 0.4 0 - 1 K/uL   Eosinophils Relative 0 %   Eosinophils Absolute 0.0 0 - 0 K/uL   Basophils Relative 0 %   Basophils Absolute 0.0 0 - 0 K/uL   Immature Granulocytes 2 %   Abs Immature Granulocytes 0.09 (H) 0.00 - 0.07 K/uL  Comprehensive metabolic panel     Status: Abnormal   Collection Time: 12/09/19  7:59 AM  Result Value Ref Range   Sodium 126 (L) 135 - 145 mmol/L   Potassium 3.7 3.5 - 5.1 mmol/L   Chloride 93 (L) 98 - 111 mmol/L   CO2 22 22 - 32 mmol/L   Glucose, Bld 138 (H) 70 - 99 mg/dL   BUN 10 8 - 23 mg/dL   Creatinine, Ser 0.27 0.61 - 1.24 mg/dL   Calcium 8.1 (L) 8.9 - 10.3 mg/dL   Total Protein 7.1 6.5 - 8.1 g/dL   Albumin 3.1 (L) 3.5 - 5.0 g/dL   AST 98 (H) 15 - 41 U/L   ALT 72 (H) 0 - 44 U/L   Alkaline Phosphatase 62 38 - 126 U/L   Total Bilirubin 1.3 (H) 0.3 - 1.2 mg/dL   GFR calc non Af Amer >60 >60 mL/min   GFR calc Af Amer >60 >60 mL/min   Anion gap 11 5 - 15  ABO/Rh     Status: None   Collection Time: 09-Dec-2019  7:59 AM  Result Value Ref Range   ABO/RH(D)      A POS Performed at [facility], [address] [place], [place] [address]   Lactic acid, plasma     Status: None   Collection Time: Dec 09, 2019  8:20 AM  Result Value Ref Range   Lactic Acid, Venous 1.5 0.5 - 1.9 mmol/L  SARS Coronavirus 2 by RT PCR (hospital order, performed in [facility] hospital lab) Nasopharyngeal Nasopharyngeal Swab      Status: Abnormal   Collection Time: 12-09-19  8:21 AM   Specimen: Nasopharyngeal Swab  Result Value Ref Range   SARS Coronavirus 2 POSITIVE (A) NEGATIVE  Urinalysis, Complete w Microscopic Urine, Clean Catch     Status: Abnormal   Collection Time: 12-09-19  9:43 AM  Result Value Ref Range   Color, Urine STRAW (A) YELLOW   APPearance CLEAR (  A) CLEAR   Specific Gravity, Urine 1.002 (L) 1.005 - 1.030   pH 7.0 5.0 - 8.0   Glucose, UA NEGATIVE NEGATIVE mg/dL   Hgb urine dipstick SMALL (A) NEGATIVE   Bilirubin Urine NEGATIVE NEGATIVE   Ketones, ur NEGATIVE NEGATIVE mg/dL   Protein, ur NEGATIVE NEGATIVE mg/dL   Nitrite NEGATIVE NEGATIVE   Leukocytes,Ua NEGATIVE NEGATIVE   RBC / HPF 0-5 0 - 5 RBC/hpf   WBC, UA 0-5 0 - 5 WBC/hpf   Bacteria, UA NONE SEEN NONE SEEN   Squamous Epithelial / LPF NONE SEEN 0 - 5    Imaging: DG Chest Portable 1 View  Result Date: 11/21/2019 CLINICAL DATA:  Hypoxia and fever EXAM: PORTABLE CHEST 1 VIEW COMPARISON:  None. FINDINGS: Patchy bilateral airspace disease. Low lung volumes. Prominent heart size, accentuated by low volume chest. No visible effusion or pneumothorax. IMPRESSION: Atypical pneumonia pattern with low lung volumes. Electronically Signed   By: Marnee Spring M.D.   On: 2019/11/21 08:48    Assessment and Plan: This is a 59 y.o. male with an incarcerated incisional hernia, successfully reduced in the ER.  --discussed with the patient that normally, now that the hernia is reduced, would offer admission for hernia repair vs treating him as outpatient.  However, with his respiratory distress and being COVID positive, and his hernia being reduced, I think it would be best to defer this surgery to the outpatient setting, once he's recovered from COVID and his respiratory status has improved.  Otherwise, the general anesthesia and intubation may cause more issues.  At this point, he's pain free and there's no urgent surgical need. --He may have a diet,  and continue his management per medical team.  He can follow up with me as outpatient to discuss surgery.  While in-house, if the hernia bulges out again and he's having pain, please let us know right away. --Will sign off for now, but please feel free to call/consult if any issues or concerns.  Face-to-face time spent with the patient and care providers was 55 minutes, with more than 50% of the time spent counseling, educating, and coordinating care of the patient.     Howie Ill, MD [facility] Pg:  [phone]

## 2019-11-19 NOTE — ED Triage Notes (Signed)
Presents with abd pain  Hx of hernia  Thinks he may have "ruptured"   The pain increased about 4 hours ago

## 2019-11-19 NOTE — ED Notes (Signed)
Pt given meal tray.

## 2019-11-19 NOTE — Progress Notes (Signed)
Pt arrived from ED for continued medical treatment.  Denies pain, tele connected, ambulatory, on [facility]

## 2019-11-19 NOTE — Consult Note (Signed)
Remdesivir - Pharmacy Brief Note  A/P:  Remdesivir 200 mg IVPB once followed by 100 mg IVPB daily x 4 days.   Albina Billet, PharmD, BCPS Clinical Pharmacist November 28, 2019 10:04 AM

## 2019-11-19 NOTE — H&P (Signed)
History and Physical    Rick Lowery VCB:[id number] DOB: 1960/08/16 DOA: Dec 17, 2019  PCP: Patient, No Pcp Per   Patient coming from: Home  I have personally briefly reviewed patient's old medical records in [facility]  Chief Complaint: Abdominal pain  HPI: Rick Lowery is a 59 y.o. male with medical history significant for chronic neck and back pain who presents to the emergency room for evaluation of abdominal pain mostly around the periumbilical area.  He rated his abdominal pain a 10 x 10 in intensity at its worst.  It was sudden in onset and associated with nausea but he denied having any vomiting or any changes in his bowel habits.  There was no radiation of the pain.  He denies having any urinary symptoms, no chest pain, no shortness of breath, no fever, no dizziness, no lightheadedness. While in the ER patient was noted to be hypoxic and had room air pulse oximetry of 82% and is currently on a nonrebreather mask with pulse oximetry in the low 90s.  He is unvaccinated and denies having any sick contacts. Vital signs T-max 100.10F, pulse 85, respiratory rate 18, blood pressure 140/66. Labs reveal sodium 126, potassium 3.7, chloride 93, bicarb 22, glucose 138, BUN 10, creatinine 0.90, lactic acid 1.5, white count 5.4, hemoglobin 14.8, hematocrit 41, platelet count 229, His SARS coronavirus 2 PCR test is positive Chest x-ray reviewed by me shows bilateral opacities in both lung fields.    ED Course: Patient is a 59 year old male who presented to the emergency room for evaluation of sudden onset abdominal pain mostly periumbilical and was found to have an incarcerated ventral hernia which was reduced in the emergency room by the ER provider.  Patient was noted to be hypoxic with room air pulse oximetry of 82% and is currently on a nonrebreather mask with improvement in his pulse oximetry to the low 90s.  Patient is unvaccinated and chest x-ray suggestive of atypical pneumonia.   He will be admitted to the hospital for further evaluation.  Review of Systems: As per HPI otherwise 10 point review of systems negative.    Past Medical History:  Diagnosis Date  . Chronic neck and back pain     Past Surgical History:  Procedure Laterality Date  . CHOLECYSTECTOMY       reports that he has never smoked. He has never used smokeless tobacco. He reports that he does not drink alcohol and does not use drugs.  No Known Allergies  Family History  Problem Relation Age of Onset  . Stroke Father   . Diabetes Mother   . Heart disease Mother      Prior to Admission medications   Medication Sig Start Date End Date Taking? Authorizing Provider  aspirin EC 81 MG tablet Take 1 tablet (81 mg total) by mouth daily. 10/03/15   Auburn Bilberry, MD    Physical Exam: Vitals:   2019/12/17 0815 12/17/19 0826 12-17-2019 0828 12/17/19 0901  BP:    109/65  Pulse: 84   71  Resp:    (!) 29  Temp:      TempSrc:      SpO2: (S) (!) 78% (!) 84% 92% 97%  Weight:      Height:         Vitals:   2019/12/17 0815 12/17/2019 0826 Dec 17, 2019 0828 12/17/19 0901  BP:    109/65  Pulse: 84   71  Resp:    (!) 29  Temp:  TempSrc:      SpO2: (S) (!) 78% (!) 84% 92% 97%  Weight:      Height:        Constitutional: NAD, alert and oriented x 3.  Acutely ill-appearing Eyes: PERRL, lids and conjunctivae pallor ENMT: Mucous membranes are dry  Neck: normal, supple, no masses, no thyromegaly Respiratory: clear to auscultation bilaterally, no wheezing, no crackles. Normal respiratory effort. No accessory muscle use.  Cardiovascular: Regular rate and rhythm, no murmurs / rubs / gallops. No extremity edema. 2+ pedal pulses. No carotid bruits.  Abdomen: Periumbilical tenderness to palpation, no masses palpated. No hepatosplenomegaly. Bowel sounds positive.  Musculoskeletal: no clubbing / cyanosis. No joint deformity upper and lower extremities.  Skin: no rashes, lesions, ulcers.  Neurologic: No  gross focal neurologic deficit. Psychiatric: Normal mood and affect.   Labs on Admission: I have personally reviewed following labs and imaging studies  CBC: Recent Labs  Lab 13-Dec-2019 0759  WBC 5.4  NEUTROABS 4.5  HGB 14.8  HCT 41.0  MCV 81.3  PLT 229   Basic Metabolic Panel: Recent Labs  Lab 13-Dec-2019 0759  NA 126*  K 3.7  CL 93*  CO2 22  GLUCOSE 138*  BUN 10  CREATININE 0.90  CALCIUM 8.1*   GFR: Estimated Creatinine Clearance: 100.9 mL/min (by C-G formula based on SCr of 0.9 mg/dL). Liver Function Tests: Recent Labs  Lab 2019-12-13 0759  AST 98*  ALT 72*  ALKPHOS 62  BILITOT 1.3*  PROT 7.1  ALBUMIN 3.1*   No results for input(s): LIPASE, AMYLASE in the last 168 hours. No results for input(s): AMMONIA in the last 168 hours. Coagulation Profile: No results for input(s): INR, PROTIME in the last 168 hours. Cardiac Enzymes: No results for input(s): CKTOTAL, CKMB, CKMBINDEX, TROPONINI in the last 168 hours. BNP (last 3 results) No results for input(s): PROBNP in the last 8760 hours. HbA1C: No results for input(s): HGBA1C in the last 72 hours. CBG: No results for input(s): GLUCAP in the last 168 hours. Lipid Profile: No results for input(s): CHOL, HDL, LDLCALC, TRIG, CHOLHDL, LDLDIRECT in the last 72 hours. Thyroid Function Tests: No results for input(s): TSH, T4TOTAL, FREET4, T3FREE, THYROIDAB in the last 72 hours. Anemia Panel: No results for input(s): VITAMINB12, FOLATE, FERRITIN, TIBC, IRON, RETICCTPCT in the last 72 hours. Urine analysis:    Component Value Date/Time   COLORURINE STRAW (A) Dec 13, 2019 0943   APPEARANCEUR CLEAR (A) 12-13-19 0943   LABSPEC 1.002 (L) December 13, 2019 0943   PHURINE 7.0 12-13-19 0943   GLUCOSEU NEGATIVE December 13, 2019 0943   HGBUR SMALL (A) December 13, 2019 0943   BILIRUBINUR NEGATIVE Dec 13, 2019 0943   KETONESUR NEGATIVE 12-13-2019 0943   PROTEINUR NEGATIVE December 13, 2019 0943   NITRITE NEGATIVE Dec 13, 2019 0943   LEUKOCYTESUR  NEGATIVE December 13, 2019 0943    Radiological Exams on Admission: DG Chest Portable 1 View  Result Date: 12/13/2019 CLINICAL DATA:  Hypoxia and fever EXAM: PORTABLE CHEST 1 VIEW COMPARISON:  None. FINDINGS: Patchy bilateral airspace disease. Low lung volumes. Prominent heart size, accentuated by low volume chest. No visible effusion or pneumothorax. IMPRESSION: Atypical pneumonia pattern with low lung volumes. Electronically Signed   By: Marnee Spring M.D.   On: 2019/12/13 08:48    EKG: Independently reviewed.   Assessment/Plan Principal Problem:   Pneumonia due to COVID-19 virus Active Problems:   Acute respiratory failure due to COVID-19 [facility])   Incarcerated ventral hernia   Transaminitis   Hyponatremia     Pneumonia with acute respiratory failure due  to COVID-19 virus Patient noted to be hypoxic in the ER with room air pulse oximetry in the 80's requiring a non re breather mask to improve pulse oximetry to the low 90's He is currently on high flow nasal cannula 15 L and appears comfortable and is maintaining pulse oximetry in the low 90s Will place patient on Remdesivir per protocol Place patient on IV Solumedrol Continue oxygen supplementation to maintain pulse oximetry > 92%   Incarcerated ventral hernia Patient presented for evaluation of abdominal pain mostly peri umbilcal and was found to have an incarcerated ventral hernia. Incarcerated hernia was reduced in the ER but patient continues to have periumbilical tenderness with palpation Will request surgical consult Keep patient NPO until seen by surgery    Hyponatremia Most likely related to poor oral intake Gentle IVF hydration Repeat sodium levels in am    Transaminitis Unclear etiology Patient denies alcohol use      DVT prophylaxis: SCD until cleared by surgery and will start patient on Lovenox Code Status: Full code Family Communication: Greater than 50% of time was spent discussing plan of care with  patient at the bedside.  He verbalizes understanding and agrees with the plan. Disposition Plan: Back to previous home environment Consults called: None    Lorma Heater MD [facility]     12/15/2019, 10:43 AM

## 2019-11-20 DIAGNOSIS — R7989 Other specified abnormal findings of blood chemistry: Secondary | ICD-10-CM

## 2019-11-20 LAB — COMPREHENSIVE METABOLIC PANEL WITH GFR
ALT: 132 U/L — ABNORMAL HIGH (ref 0–44)
AST: 120 U/L — ABNORMAL HIGH (ref 15–41)
Albumin: 2.7 g/dL — ABNORMAL LOW (ref 3.5–5.0)
Alkaline Phosphatase: 181 U/L — ABNORMAL HIGH (ref 38–126)
Anion gap: 10 (ref 5–15)
BUN: 15 mg/dL (ref 8–23)
CO2: 25 mmol/L (ref 22–32)
Calcium: 8.4 mg/dL — ABNORMAL LOW (ref 8.9–10.3)
Chloride: 100 mmol/L (ref 98–111)
Creatinine, Ser: 0.73 mg/dL (ref 0.61–1.24)
GFR calc Af Amer: >60 mL/min
GFR calc non Af Amer: >60 mL/min
Glucose, Bld: 153 mg/dL — ABNORMAL HIGH (ref 70–99)
Potassium: 3.7 mmol/L (ref 3.5–5.1)
Sodium: 135 mmol/L (ref 135–145)
Total Bilirubin: 1.1 mg/dL (ref 0.3–1.2)
Total Protein: 6.6 g/dL (ref 6.5–8.1)

## 2019-11-20 LAB — CBC WITH DIFFERENTIAL/PLATELET
Abs Immature Granulocytes: 0.01 10*3/uL (ref 0.00–0.07)
Basophils Absolute: 0 10*3/uL (ref 0.0–0.1)
Basophils Relative: 0 %
Eosinophils Absolute: 0 10*3/uL (ref 0.0–0.5)
Eosinophils Relative: 0 %
HCT: 43.8 % (ref 39.0–52.0)
Hemoglobin: 15.4 g/dL (ref 13.0–17.0)
Immature Granulocytes: 0 %
Lymphocytes Relative: 7 %
Lymphs Abs: 0.4 10*3/uL — ABNORMAL LOW (ref 0.7–4.0)
MCH: 29.2 pg (ref 26.0–34.0)
MCHC: 35.2 g/dL (ref 30.0–36.0)
MCV: 83 fL (ref 80.0–100.0)
Monocytes Absolute: 0.4 10*3/uL (ref 0.1–1.0)
Monocytes Relative: 7 %
Neutro Abs: 5.1 10*3/uL (ref 1.7–7.7)
Neutrophils Relative %: 86 %
Platelets: 268 10*3/uL (ref 150–400)
RBC: 5.28 MIL/uL (ref 4.22–5.81)
RDW: 14.8 % (ref 11.5–15.5)
WBC: 5.9 10*3/uL (ref 4.0–10.5)
nRBC: 0 % (ref 0.0–0.2)

## 2019-11-20 LAB — PHOSPHORUS: Phosphorus: 3.6 mg/dL (ref 2.5–4.6)

## 2019-11-20 LAB — FERRITIN: Ferritin: 655 ng/mL — ABNORMAL HIGH (ref 24–336)

## 2019-11-20 LAB — HIV ANTIBODY (ROUTINE TESTING W REFLEX): HIV Screen 4th Generation wRfx: NONREACTIVE

## 2019-11-20 LAB — FIBRIN DERIVATIVES D-DIMER (ARMC ONLY): Fibrin derivatives D-dimer (ARMC): 1242.21 ng/mL (FEU) — ABNORMAL HIGH (ref 0.00–499.00)

## 2019-11-20 LAB — MAGNESIUM: Magnesium: 2.4 mg/dL (ref 1.7–2.4)

## 2019-11-20 LAB — HEMOGLOBIN A1C
Hgb A1c MFr Bld: 6.2 % — ABNORMAL HIGH (ref 4.8–5.6)
Mean Plasma Glucose: 131.24 mg/dL

## 2019-11-20 LAB — C-REACTIVE PROTEIN: CRP: 18.1 mg/dL — ABNORMAL HIGH (ref <1.0)

## 2019-11-20 MED ORDER — ENOXAPARIN SODIUM 40 MG/0.4ML ~~LOC~~ SOLN
40.0000 mg | SUBCUTANEOUS | Status: DC
  Administered 2019-11-20 – 2019-11-21 (×2): 40 mg via SUBCUTANEOUS
  Filled 2019-11-20 (×2): qty 0.4

## 2019-11-20 MED ORDER — BARICITINIB 2 MG PO TABS
4.0000 mg | ORAL_TABLET | Freq: Every day | ORAL | Status: DC
  Administered 2019-11-20 – 2019-11-28 (×9): 4 mg via ORAL
  Filled 2019-11-20 (×9): qty 2

## 2019-11-20 NOTE — Progress Notes (Signed)
Patient ID: Rick Lowery, male   DOB: 01-16-1961, 59 y.o.   MRN: [id number] [facility] PROGRESS NOTE  Rick Lowery KGY:[id number] DOB: 1960-08-14 DOA: 2019-11-24 PCP: Patient, No Pcp Per  HPI/Subjective: Patient not having any further abdominal pain.  Having some shortness of breath and a little cough.  Came in initially with abdominal pain and had a incarcerated hernia that was reduced.  Patient also found to have COVID-19 pneumonia now on high flow nasal cannula.  Objective: Vitals:   11/20/19 1212 11/20/19 1545  BP: 111/63 (!) 159/81  Pulse: 66 83  Resp: 20 (!) 25  Temp: 97.6 F (36.4 C) 98.7 F (37.1 C)  SpO2: 95% 90%    Intake/Output Summary (Last 24 hours) at 11/20/2019 1657 Last data filed at 11/20/2019 1500 Gross per 24 hour  Intake 100 ml  Output 775 ml  Net -675 ml   Filed Weights   11/24/2019 0747  Weight: 104.3 kg    ROS: Review of Systems  Respiratory: Positive for cough and shortness of breath.   Gastrointestinal: Negative for abdominal pain, nausea and vomiting.   Exam: Physical Exam HENT:     Nose: No mucosal edema.     Mouth/Throat:     Pharynx: No oropharyngeal exudate.  Eyes:     General: Lids are normal.     Extraocular Movements: Extraocular movements intact.     Conjunctiva/sclera: Conjunctivae normal.  Cardiovascular:     Rate and Rhythm: Normal rate and regular rhythm.     Heart sounds: Normal heart sounds, S1 normal and S2 normal.  Pulmonary:     Breath sounds: Examination of the right-lower field reveals decreased breath sounds and rhonchi. Examination of the left-lower field reveals decreased breath sounds and rhonchi. Decreased breath sounds and rhonchi present. No wheezing or rales.  Abdominal:     Palpations: Abdomen is soft.     Tenderness: There is no abdominal tenderness.  Musculoskeletal:     Right ankle: No swelling.     Left ankle: No swelling.  Skin:    General: Skin is warm.     Findings: No rash.   Neurological:     Mental Status: He is alert and oriented to person, place, and time.       Data Reviewed: Basic Metabolic Panel: Recent Labs  Lab 2019-11-24 0759 11/20/19 0653  NA 126* 135  K 3.7 3.7  CL 93* 100  CO2 22 25  GLUCOSE 138* 153*  BUN 10 15  CREATININE 0.90 0.73  CALCIUM 8.1* 8.4*  MG  --  2.4  PHOS  --  3.6   Liver Function Tests: Recent Labs  Lab 2019/11/24 0759 11/20/19 0653  AST 98* 120*  ALT 72* 132*  ALKPHOS 62 181*  BILITOT 1.3* 1.1  PROT 7.1 6.6  ALBUMIN 3.1* 2.7*   CBC: Recent Labs  Lab 11-24-19 0759 11/20/19 0653  WBC 5.4 5.9  NEUTROABS 4.5 5.1  HGB 14.8 15.4  HCT 41.0 43.8  MCV 81.3 83.0  PLT 229 268     Recent Results (from the past 240 hour(s))  SARS Coronavirus 2 by RT PCR (hospital order, performed in [facility] hospital lab) Nasopharyngeal Nasopharyngeal Swab     Status: Abnormal   Collection Time: November 24, 2019  8:21 AM   Specimen: Nasopharyngeal Swab  Result Value Ref Range Status   SARS Coronavirus 2 POSITIVE (A) NEGATIVE Final    Comment: RESULT CALLED TO, READ BACK BY AND VERIFIED WITH: MEGAN JONES AT 0931 ON  2019/11/22 [facility]. (NOTE) SARS-CoV-2 target nucleic acids are DETECTED  SARS-CoV-2 RNA is generally detectable in upper respiratory specimens  during the acute phase of infection.  Positive results are indicative  of the presence of the identified virus, but do not rule out bacterial infection or co-infection with other pathogens not detected by the test.  Clinical correlation with patient history and  other diagnostic information is necessary to determine patient infection status.  The expected result is negative.  Fact Sheet for Patients:   BoilerBrush.com.cy   Fact Sheet for Healthcare Providers:   https://pope.com/    This test is not yet approved or cleared by the [place] FDA and  has been authorized for detection and/or diagnosis of SARS-CoV-2 by FDA  under an Emergency Use Authorization (EUA).  This EUA will remain in effect (meaning this  test can be used) for the duration of  the COVID-19 declaration under Section 564(b)(1) of the Act, 21 U.S.C. section 360-bbb-3(b)(1), unless the authorization is terminated or revoked sooner.  Performed at [facility], [address] [place], [place] [address]      Studies: DG Chest Portable 1 View  Result Date: 11-22-19 CLINICAL DATA:  Hypoxia and fever EXAM: PORTABLE CHEST 1 VIEW COMPARISON:  None. FINDINGS: Patchy bilateral airspace disease. Low lung volumes. Prominent heart size, accentuated by low volume chest. No visible effusion or pneumothorax. IMPRESSION: Atypical pneumonia pattern with low lung volumes. Electronically Signed   By: Marnee Spring M.D.   On: 11-22-2019 08:48    Scheduled Meds: . albuterol  2 puff Inhalation Q6H  . vitamin C  500 mg Oral Daily  . baricitinib  4 mg Oral Daily  . enoxaparin (LOVENOX) injection  40 mg Subcutaneous Q24H  . methylPREDNISolone (SOLU-MEDROL) injection  0.25 mg/kg Intravenous Q12H  . sodium chloride flush  3 mL Intravenous Q12H  . zinc sulfate  220 mg Oral Daily   Continuous Infusions: . sodium chloride    . sodium chloride    . remdesivir 100 mg in NS 100 mL 100 mg (11/20/19 0855)    Assessment/Plan:  1. Acute hypoxic respiratory failure secondary to COVID-19 pneumonia.  Patient currently on high flow nasal cannula 8 L.  Baricitinib started today 2. COVID-19 pneumonia on remdesivir day 2 out of 5.  Continue Solu-Medrol.  Albuterol inhaler. 3. Elevated liver function test secondary to COVID-19 4. Incarcerated ventral hernia which was reduced.  Seen by surgery and they will do outpatient procedure once recovered from Covid. 5. Hyponatremia this has improved into the normal range. 6. Impaired fasting glucose.  Add on a hemoglobin A1c       Code Status:     Code Status Orders  (From admission, onward)          Start     Ordered   11-22-2019 1015  Full code  Continuous        2019-11-22 1024        Code Status History    Date Active Date Inactive Code Status Order ID Comments User Context   10/02/2015 2226 10/03/2015 1950 Full Code [id number]  Oralia Manis, MD Inpatient   Advance Care Planning Activity     Family Communication: Spoke with the patient's wife on the phone Disposition Plan: Status is: Inpatient  Dispo: The patient is from: Home              Anticipated d/c is to: Home  Anticipated d/c date is: Unable to be determined secondary to being on high flow nasal cannula right now              Patient currently being treated for COVID-19 pneumonia and acute hypoxic respiratory failure secondary to COVID-19.  Time spent: 28 minutes  Monti Villers [employer]

## 2019-11-21 DIAGNOSIS — G4700 Insomnia, unspecified: Secondary | ICD-10-CM

## 2019-11-21 DIAGNOSIS — R7301 Impaired fasting glucose: Secondary | ICD-10-CM

## 2019-11-21 LAB — CBC WITH DIFFERENTIAL/PLATELET
Abs Immature Granulocytes: 0.06 10*3/uL (ref 0.00–0.07)
Basophils Absolute: 0 10*3/uL (ref 0.0–0.1)
Basophils Relative: 0 %
Eosinophils Absolute: 0 10*3/uL (ref 0.0–0.5)
Eosinophils Relative: 0 %
HCT: 45.5 % (ref 39.0–52.0)
Hemoglobin: 15.6 g/dL (ref 13.0–17.0)
Immature Granulocytes: 1 %
Lymphocytes Relative: 7 %
Lymphs Abs: 0.5 10*3/uL — ABNORMAL LOW (ref 0.7–4.0)
MCH: 29 pg (ref 26.0–34.0)
MCHC: 34.3 g/dL (ref 30.0–36.0)
MCV: 84.6 fL (ref 80.0–100.0)
Monocytes Absolute: 0.6 10*3/uL (ref 0.1–1.0)
Monocytes Relative: 8 %
Neutro Abs: 6.9 10*3/uL (ref 1.7–7.7)
Neutrophils Relative %: 84 %
Platelets: 351 10*3/uL (ref 150–400)
RBC: 5.38 MIL/uL (ref 4.22–5.81)
RDW: 14.7 % (ref 11.5–15.5)
WBC: 8.1 10*3/uL (ref 4.0–10.5)
nRBC: 0 % (ref 0.0–0.2)

## 2019-11-21 LAB — COMPREHENSIVE METABOLIC PANEL
ALT: 99 U/L — ABNORMAL HIGH (ref 0–44)
AST: 55 U/L — ABNORMAL HIGH (ref 15–41)
Albumin: 2.9 g/dL — ABNORMAL LOW (ref 3.5–5.0)
Alkaline Phosphatase: 158 U/L — ABNORMAL HIGH (ref 38–126)
Anion gap: 10 (ref 5–15)
BUN: 16 mg/dL (ref 8–23)
CO2: 25 mmol/L (ref 22–32)
Calcium: 8.3 mg/dL — ABNORMAL LOW (ref 8.9–10.3)
Chloride: 101 mmol/L (ref 98–111)
Creatinine, Ser: 0.89 mg/dL (ref 0.61–1.24)
GFR calc Af Amer: >60 mL/min (ref >60)
GFR calc non Af Amer: >60 mL/min (ref >60)
Glucose, Bld: 145 mg/dL — ABNORMAL HIGH (ref 70–99)
Potassium: 3.7 mmol/L (ref 3.5–5.1)
Sodium: 136 mmol/L (ref 135–145)
Total Bilirubin: 1.1 mg/dL (ref 0.3–1.2)
Total Protein: 6.8 g/dL (ref 6.5–8.1)

## 2019-11-21 LAB — PHOSPHORUS: Phosphorus: 4.3 mg/dL (ref 2.5–4.6)

## 2019-11-21 LAB — C-REACTIVE PROTEIN: CRP: 9.6 mg/dL — ABNORMAL HIGH (ref <1.0)

## 2019-11-21 LAB — MAGNESIUM: Magnesium: 2.6 mg/dL — ABNORMAL HIGH (ref 1.7–2.4)

## 2019-11-21 LAB — FIBRIN DERIVATIVES D-DIMER (ARMC ONLY): Fibrin derivatives D-dimer (ARMC): 1489.93 ng/mL (FEU) — ABNORMAL HIGH (ref 0.00–499.00)

## 2019-11-21 LAB — FERRITIN: Ferritin: 818 ng/mL — ABNORMAL HIGH (ref 24–336)

## 2019-11-21 MED ORDER — IVERMECTIN 3 MG PO TABS
150.0000 ug/kg | ORAL_TABLET | Freq: Once | ORAL | Status: AC
  Administered 2019-11-21: 16:00:00 15000 ug via ORAL
  Filled 2019-11-21: qty 5

## 2019-11-21 MED ORDER — TRAZODONE HCL 50 MG PO TABS: 50.0000 mg | ORAL_TABLET | Freq: Every day | ORAL | Status: DC

## 2019-11-21 MED ORDER — TRAZODONE HCL 50 MG PO TABS
50.0000 mg | ORAL_TABLET | Freq: Every evening | ORAL | Status: DC | PRN
  Administered 2019-11-21: 01:00:00 50 mg via ORAL
  Filled 2019-11-21: qty 1

## 2019-11-21 MED ORDER — TRAZODONE HCL 100 MG PO TABS
100.0000 mg | ORAL_TABLET | Freq: Every day | ORAL | Status: DC
  Administered 2019-11-21 – 2019-12-01 (×8): 100 mg via ORAL
  Filled 2019-11-21: qty 2
  Filled 2019-11-21: qty 1
  Filled 2019-11-21: qty 2
  Filled 2019-11-21 (×4): qty 1
  Filled 2019-11-21 (×2): qty 2
  Filled 2019-11-21: qty 1
  Filled 2019-11-21: qty 2
  Filled 2019-11-21: qty 1

## 2019-11-21 MED ORDER — METHYLPREDNISOLONE SODIUM SUCC 40 MG IJ SOLR
40.0000 mg | Freq: Two times a day (BID) | INTRAMUSCULAR | Status: DC
  Administered 2019-11-21 – 2019-11-24 (×6): 40 mg via INTRAVENOUS
  Filled 2019-11-21 (×6): qty 1

## 2019-11-21 NOTE — Progress Notes (Signed)
Physical Therapy Evaluation Patient Details Name: Rick Lowery MRN: [id number] DOB: [birth year] Today's Date: 11/21/2019   History of Present Illness  Per MD note:Rick Lowery is a 59 y.o. male with medical history significant for chronic neck and back pain who presents to the emergency room for evaluation of abdominal pain mostly around the periumbilical area.  He rated his abdominal pain a 10 x 10 in intensity at its worst.  It was sudden in onset and associated with nausea but he denied having any vomiting or any changes in his bowel habits.  There was no radiation of the pain.  He denies having any urinary symptoms, no chest pain, no shortness of breath, no fever, no dizziness, no lightheadedness.  Clinical Impression  Patient agrees to PT evaluation. Pt reports no pain. Pt lives alone with a level entry into his home. Pt ambulated without AD prior to this hospital admission. Pt has 3/5 strength BLE hip and knee and is MI for bed mobility. He is able to sit for less than 1 minute before O2 desaturation to 81%.  Pt has WNL static sitting balance. He is not able to stand due to poor O2 saturation and not able to ambulate.  Pt will continue to benefit from skilled PT to improve mobility and strength.    Follow Up Recommendations SNF    Equipment Recommendations  Rolling walker with 5" wheels    Recommendations for Other Services       Precautions / Restrictions Restrictions Weight Bearing Restrictions: No      Mobility  Bed Mobility Overal bed mobility: Independent             General bed mobility comments:  (sitting O2 saturation decrased to 81 % after 1 min)  Transfers Overall transfer level:  (unable due to O2  desaturation )                  Ambulation/Gait                Stairs            Wheelchair Mobility    Modified Rankin (Stroke Patients Only)       Balance                                              Pertinent Vitals/Pain      Home Living Family/patient expects to be discharged to:: Private residence Living Arrangements: Spouse/significant other Available Help at Discharge: Family Type of Home: House Home Access: Level entry     Home Layout: One level        Prior Function Level of Independence: Independent               Hand Dominance        Extremity/Trunk Assessment        Lower Extremity Assessment Lower Extremity Assessment: Generalized weakness       Communication   Communication: No difficulties  Cognition Arousal/Alertness: Awake/alert Behavior During Therapy: WFL for tasks assessed/performed                                          General Comments      Exercises     Assessment/Plan    PT Assessment Patient  needs continued PT services  PT Problem List Decreased strength;Decreased mobility       PT Treatment Interventions Gait training;Therapeutic activities;Therapeutic exercise    PT Goals (Current goals can be found in the Care Plan section)  Acute Rehab PT Goals Patient Stated Goal: to stand up PT Goal Formulation: Patient unable to participate in goal setting Time For Goal Achievement: 12/05/19 Potential to Achieve Goals: Fair    Frequency Min 2X/week   Barriers to discharge        Co-evaluation               AM-PAC PT "6 Clicks" Mobility  Outcome Measure Help needed turning from your back to your side while in a flat bed without using bedrails?: None Help needed moving from lying on your back to sitting on the side of a flat bed without using bedrails?: None Help needed moving to and from a bed to a chair (including a wheelchair)?: A Lot Help needed standing up from a chair using your arms (e.g., wheelchair or bedside chair)?: Total Help needed to walk in hospital room?: Total Help needed climbing 3-5 steps with a railing? : Total 6 Click Score: 13    End of Session   Activity  Tolerance: Patient limited by fatigue;Treatment limited secondary to medical complications (Comment) Patient left: in bed Nurse Communication: Mobility status PT Visit Diagnosis: Muscle weakness (generalized) (M62.81)    Time: 1500-1530 PT Time Calculation (min) (ACUTE ONLY): 30 min   Charges:   PT Evaluation $PT Eval Low Complexity: 1 Low            Ezekiel Ina, PT DPT 11/21/2019, 3:55 PM

## 2019-11-21 NOTE — Progress Notes (Signed)
Just spoke to pt's wife, Boneta Lucks. Shared with wife that the Doctor added a medication that she suggested Stromectol for Covid.Wife shared that they are now homeless and since husband got sick. I will reach out to [facility] to see if they could offer any resources. They have been evicted 2x. Wife was grateful for our care.

## 2019-11-21 NOTE — Progress Notes (Signed)
Patient ID: Rick Lowery, male   DOB: 12-Jul-1960, 59 y.o.   MRN: [id number] [facility] PROGRESS NOTE  Rick Lowery [id number]:[id number] DOB: 19-Oct-1960 DOA: December 13, 2019 PCP: Patient, No Pcp Per  HPI/Subjective: States he has not slept very much since being here.  Very short of breath and still having coughing.  Admitted with shortness of breath and COVID-19 pneumonia.  Also had incarcerated ventral hernia that was reduced and that is feeling better now.  No abdominal pain.   Objective: Vitals:   11/21/19 0850 11/21/19 1215  BP:  127/72  Pulse:  72  Resp:  17  Temp:  97.8 F (36.6 C)  SpO2: 90% 90%    Intake/Output Summary (Last 24 hours) at 11/21/2019 1501 Last data filed at 11/21/2019 0104 Gross per 24 hour  Intake --  Output 400 ml  Net -400 ml   Filed Weights   Dec 13, 2019 0747  Weight: 104.3 kg    ROS: Review of Systems  Respiratory: Positive for cough and shortness of breath.   Cardiovascular: Negative for chest pain.  Gastrointestinal: Negative for abdominal pain, nausea and vomiting.  Psychiatric/Behavioral: The patient has insomnia.    Exam: Physical Exam HENT:     Nose: No mucosal edema.     Mouth/Throat:     Pharynx: No oropharyngeal exudate.  Eyes:     General: Lids are normal.     Conjunctiva/sclera: Conjunctivae normal.  Cardiovascular:     Rate and Rhythm: Normal rate and regular rhythm.     Heart sounds: Normal heart sounds, S1 normal and S2 normal.  Pulmonary:     Breath sounds: Examination of the right-upper field reveals decreased breath sounds. Examination of the left-upper field reveals decreased breath sounds. Examination of the right-middle field reveals decreased breath sounds. Examination of the left-middle field reveals decreased breath sounds. Examination of the right-lower field reveals decreased breath sounds and rhonchi. Examination of the left-lower field reveals decreased breath sounds and rhonchi. Decreased breath sounds and rhonchi  present. No wheezing or rales.  Abdominal:     Palpations: Abdomen is soft.     Tenderness: There is abdominal tenderness.  Musculoskeletal:     Right ankle: No swelling.     Left ankle: No swelling.  Skin:    General: Skin is warm.     Findings: No rash.  Neurological:     Mental Status: He is alert and oriented to person, place, and time.       Data Reviewed: Basic Metabolic Panel: Recent Labs  Lab 12/13/2019 0759 11/20/19 0653 11/21/19 0726  NA 126* 135 136  K 3.7 3.7 3.7  CL 93* 100 101  CO2 22 25 25   GLUCOSE 138* 153* 145*  BUN 10 15 16   CREATININE 0.90 0.73 0.89  CALCIUM 8.1* 8.4* 8.3*  MG  --  2.4 2.6*  PHOS  --  3.6 4.3   Liver Function Tests: Recent Labs  Lab 12-13-2019 0759 11/20/19 0653 11/21/19 0726  AST 98* 120* 55*  ALT 72* 132* 99*  ALKPHOS 62 181* 158*  BILITOT 1.3* 1.1 1.1  PROT 7.1 6.6 6.8  ALBUMIN 3.1* 2.7* 2.9*   CBC: Recent Labs  Lab 2019/12/13 0759 11/20/19 0653 11/21/19 0726  WBC 5.4 5.9 8.1  NEUTROABS 4.5 5.1 6.9  HGB 14.8 15.4 15.6  HCT 41.0 43.8 45.5  MCV 81.3 83.0 84.6  PLT 229 268 351     Recent Results (from the past 240 hour(s))  SARS Coronavirus 2 by RT PCR (  hospital order, performed in [facility] hospital lab) Nasopharyngeal Nasopharyngeal Swab     Status: Abnormal   Collection Time: 11/24/19  8:21 AM   Specimen: Nasopharyngeal Swab  Result Value Ref Range Status   SARS Coronavirus 2 POSITIVE (A) NEGATIVE Final    Comment: RESULT CALLED TO, READ BACK BY AND VERIFIED WITH: MEGAN JONES AT 0931 ON 11/24/2019 [facility]. (NOTE) SARS-CoV-2 target nucleic acids are DETECTED  SARS-CoV-2 RNA is generally detectable in upper respiratory specimens  during the acute phase of infection.  Positive results are indicative  of the presence of the identified virus, but do not rule out bacterial infection or co-infection with other pathogens not detected by the test.  Clinical correlation with patient history and  other diagnostic  information is necessary to determine patient infection status.  The expected result is negative.  Fact Sheet for Patients:   BoilerBrush.com.cy   Fact Sheet for Healthcare Providers:   https://pope.com/    This test is not yet approved or cleared by the [place] FDA and  has been authorized for detection and/or diagnosis of SARS-CoV-2 by FDA under an Emergency Use Authorization (EUA).  This EUA will remain in effect (meaning this  test can be used) for the duration of  the COVID-19 declaration under Section 564(b)(1) of the Act, 21 U.S.C. section 360-bbb-3(b)(1), unless the authorization is terminated or revoked sooner.  Performed at [facility], [address] [place], [place] [address]       Scheduled Meds: . albuterol  2 puff Inhalation Q6H  . vitamin C  500 mg Oral Daily  . baricitinib  4 mg Oral Daily  . enoxaparin (LOVENOX) injection  40 mg Subcutaneous Q24H  . methylPREDNISolone (SOLU-MEDROL) injection  0.25 mg/kg Intravenous Q12H  . sodium chloride flush  3 mL Intravenous Q12H  . traZODone  100 mg Oral QHS  . zinc sulfate  220 mg Oral Daily   Continuous Infusions: . sodium chloride    . sodium chloride    . remdesivir 100 mg in NS 100 mL 100 mg (11/21/19 1017)    Assessment/Plan:  1. Acute hypoxic respiratory failure secondary to COVID-19 pneumonia.  Patient on high flow nasal cannula 15 L.  Baricitinib started yesterday. 2. COVID-19 pneumonia on remdesivir day 3 out of 5.  Continue Solu-Medrol and albuterol inhaler.  Patient with poor air entry.  Continue vitamin C and zinc.  Spoke with patient's wife and she wanted to try a dose of ivermectin.  I spoke with pharmacy and we dose that as a one-time dose. 3. Elevated liver function test secondary to COVID-19 4. Incarcerated ventral hernia which was reduced in the emergency room.  Follow-up with general surgery as outpatient after recovered from  Covid. 5. Insomnia.  Will give trazodone at night to see if this improves sleep. 6. Hyponatremia resolved. 7. Impaired fasting glucose.  Hemoglobin A1c is 6.2.  Patient not a diabetic at this point.  Sugars will likely be higher with steroids.        Code Status:     Code Status Orders  (From admission, onward)         Start     Ordered   11/24/19 1015  Full code  Continuous        2019-11-24 1024        Code Status History    Date Active Date Inactive Code Status Order ID Comments User Context   10/02/2015 2226 10/03/2015 1950 Full Code [id number]  Oralia Manis,  MD Inpatient   Advance Care Planning Activity     Family Communication: Spoke with the patient's wife on the phone Disposition Plan: Status is: Inpatient  Dispo: The patient is from: Home              Anticipated d/c is to: Home              Anticipated d/c date is: Unable to determine yet since he is on high flow nasal cannula 15 L              Patient currently being treated for acute hypoxic respiratory failure secondary to COVID-19 pneumonia on high flow nasal cannula currently treated with IV remdesivir and IV steroids.  Time spent: 27 minutes  Alphia [employer]

## 2019-11-22 ENCOUNTER — Inpatient Hospital Stay: Payer: HRSA Program

## 2019-11-22 LAB — COMPREHENSIVE METABOLIC PANEL
ALT: 77 U/L — ABNORMAL HIGH (ref 0–44)
AST: 38 U/L (ref 15–41)
Albumin: 2.7 g/dL — ABNORMAL LOW (ref 3.5–5.0)
Alkaline Phosphatase: 136 U/L — ABNORMAL HIGH (ref 38–126)
Anion gap: 8 (ref 5–15)
BUN: 16 mg/dL (ref 8–23)
CO2: 24 mmol/L (ref 22–32)
Calcium: 8.2 mg/dL — ABNORMAL LOW (ref 8.9–10.3)
Chloride: 103 mmol/L (ref 98–111)
Creatinine, Ser: 0.63 mg/dL (ref 0.61–1.24)
GFR calc Af Amer: >60 mL/min (ref >60)
GFR calc non Af Amer: >60 mL/min (ref >60)
Glucose, Bld: 145 mg/dL — ABNORMAL HIGH (ref 70–99)
Potassium: 4 mmol/L (ref 3.5–5.1)
Sodium: 135 mmol/L (ref 135–145)
Total Bilirubin: 1.1 mg/dL (ref 0.3–1.2)
Total Protein: 6.3 g/dL — ABNORMAL LOW (ref 6.5–8.1)

## 2019-11-22 LAB — URINALYSIS, COMPLETE (UACMP) WITH MICROSCOPIC
Bacteria, UA: NONE SEEN
Bilirubin Urine: NEGATIVE
Glucose, UA: NEGATIVE mg/dL
Hgb urine dipstick: NEGATIVE
Ketones, ur: NEGATIVE mg/dL
Leukocytes,Ua: NEGATIVE
Nitrite: NEGATIVE
Protein, ur: NEGATIVE mg/dL
Specific Gravity, Urine: 1.013 (ref 1.005–1.030)
pH: 7 (ref 5.0–8.0)

## 2019-11-22 LAB — CBC WITH DIFFERENTIAL/PLATELET
Abs Immature Granulocytes: 0.07 10*3/uL (ref 0.00–0.07)
Basophils Absolute: 0 10*3/uL (ref 0.0–0.1)
Basophils Relative: 0 %
Eosinophils Absolute: 0 10*3/uL (ref 0.0–0.5)
Eosinophils Relative: 0 %
HCT: 42.4 % (ref 39.0–52.0)
Hemoglobin: 14.8 g/dL (ref 13.0–17.0)
Immature Granulocytes: 1 %
Lymphocytes Relative: 5 %
Lymphs Abs: 0.4 10*3/uL — ABNORMAL LOW (ref 0.7–4.0)
MCH: 29.5 pg (ref 26.0–34.0)
MCHC: 34.9 g/dL (ref 30.0–36.0)
MCV: 84.5 fL (ref 80.0–100.0)
Monocytes Absolute: 0.5 10*3/uL (ref 0.1–1.0)
Monocytes Relative: 6 %
Neutro Abs: 7 10*3/uL (ref 1.7–7.7)
Neutrophils Relative %: 88 %
Platelets: 299 10*3/uL (ref 150–400)
RBC: 5.02 MIL/uL (ref 4.22–5.81)
RDW: 14.7 % (ref 11.5–15.5)
WBC: 7.9 10*3/uL (ref 4.0–10.5)
nRBC: 0 % (ref 0.0–0.2)

## 2019-11-22 LAB — FERRITIN: Ferritin: 446 ng/mL — ABNORMAL HIGH (ref 24–336)

## 2019-11-22 LAB — C-REACTIVE PROTEIN: CRP: 5.4 mg/dL — ABNORMAL HIGH (ref <1.0)

## 2019-11-22 LAB — PHOSPHORUS: Phosphorus: 3.7 mg/dL (ref 2.5–4.6)

## 2019-11-22 LAB — FIBRIN DERIVATIVES D-DIMER (ARMC ONLY): Fibrin derivatives D-dimer (ARMC): 4624.4 ng/mL (FEU) — ABNORMAL HIGH (ref 0.00–499.00)

## 2019-11-22 LAB — MAGNESIUM: Magnesium: 2.2 mg/dL (ref 1.7–2.4)

## 2019-11-22 MED ORDER — IVERMECTIN 3 MG PO TABS
150.0000 ug/kg | ORAL_TABLET | Freq: Once | ORAL | Status: AC
  Administered 2019-11-22: 15000 ug via ORAL
  Filled 2019-11-22: qty 5

## 2019-11-22 MED ORDER — IOHEXOL 350 MG/ML SOLN
100.0000 mL | Freq: Once | INTRAVENOUS | Status: AC | PRN
  Administered 2019-11-22: 100 mL via INTRAVENOUS

## 2019-11-22 MED ORDER — ENOXAPARIN SODIUM 120 MG/0.8ML ~~LOC~~ SOLN
1.0000 mg/kg | Freq: Two times a day (BID) | SUBCUTANEOUS | Status: DC
  Administered 2019-11-22 (×2): 105 mg via SUBCUTANEOUS
  Filled 2019-11-22 (×3): qty 0.8

## 2019-11-22 NOTE — Progress Notes (Signed)
Patient ID: Rick Lowery, male   DOB: [birth year], 58 y.o.   MRN: [id number] [facility] PROGRESS NOTE  Rick Lowery [id number]:[id number] DOB: [birth year] DOA: 2019-12-01 PCP: Patient, No Pcp Per  HPI/Subjective: Patient seen early this morning secondary to increased fibrin products.  Patient still having shortness of breath and was borderline hypoxic this morning on 15 L nasal cannula.  Nonrebreather mask was added.  Patient stated he slept.  Nursing staff stated he is getting up to use the commode and eating a little bit better.  Patient admitted with COVID-19 pneumonia and acute hypoxic respiratory failure  Objective: Vitals:   11/22/19 1059 11/22/19 1134  BP:  (!) 182/83  Pulse:  95  Resp:  (!) 22  Temp:  98.2 F (36.8 C)  SpO2: 90% 92%    Intake/Output Summary (Last 24 hours) at 11/22/2019 1507 Last data filed at 11/22/2019 1126 Gross per 24 hour  Intake 104.67 ml  Output 200 ml  Net -95.33 ml   Filed Weights   2019-12-01 0747  Weight: 104.3 kg    ROS: Review of Systems  Respiratory: Positive for cough and shortness of breath.   Cardiovascular: Negative for chest pain.  Gastrointestinal: Negative for abdominal pain, nausea and vomiting.   Exam: Physical Exam HENT:     Nose: No mucosal edema.     Mouth/Throat:     Pharynx: No oropharyngeal exudate.  Eyes:     General: Lids are normal.     Conjunctiva/sclera: Conjunctivae normal.  Cardiovascular:     Rate and Rhythm: Normal rate and regular rhythm.     Heart sounds: Normal heart sounds, S1 normal and S2 normal.  Pulmonary:     Breath sounds: Examination of the right-middle field reveals decreased breath sounds. Examination of the left-middle field reveals decreased breath sounds. Examination of the right-lower field reveals decreased breath sounds and rhonchi. Examination of the left-lower field reveals decreased breath sounds and rhonchi. Decreased breath sounds and rhonchi present. No wheezing or rales.   Abdominal:     Palpations: Abdomen is soft.     Tenderness: There is no abdominal tenderness.  Musculoskeletal:     Right lower leg: No swelling.     Left lower leg: No swelling.  Skin:    General: Skin is warm.     Findings: No rash.  Neurological:     Mental Status: He is alert and oriented to person, place, and time.       Data Reviewed: Basic Metabolic Panel: Recent Labs  Lab 12-01-2019 0759 11/20/19 0653 11/21/19 0726 11/22/19 0458  NA 126* 135 136 135  K 3.7 3.7 3.7 4.0  CL 93* 100 101 103  CO2 22 25 25 24   GLUCOSE 138* 153* 145* 145*  BUN 10 15 16 16   CREATININE 0.90 0.73 0.89 0.63  CALCIUM 8.1* 8.4* 8.3* 8.2*  MG  --  2.4 2.6* 2.2  PHOS  --  3.6 4.3 3.7   Liver Function Tests: Recent Labs  Lab Dec 01, 2019 0759 11/20/19 0653 11/21/19 0726 11/22/19 0458  AST 98* 120* 55* 38  ALT 72* 132* 99* 77*  ALKPHOS 62 181* 158* 136*  BILITOT 1.3* 1.1 1.1 1.1  PROT 7.1 6.6 6.8 6.3*  ALBUMIN 3.1* 2.7* 2.9* 2.7*   CBC: Recent Labs  Lab 12-01-19 0759 11/20/19 0653 11/21/19 0726 11/22/19 0458  WBC 5.4 5.9 8.1 7.9  NEUTROABS 4.5 5.1 6.9 7.0  HGB 14.8 15.4 15.6 14.8  HCT 41.0 43.8 45.5 42.4  MCV  81.3 83.0 84.6 84.5  PLT 229 268 351 299     Recent Results (from the past 240 hour(s))  SARS Coronavirus 2 by RT PCR (hospital order, performed in [facility] hospital lab) Nasopharyngeal Nasopharyngeal Swab     Status: Abnormal   Collection Time: 11/27/19  8:21 AM   Specimen: Nasopharyngeal Swab  Result Value Ref Range Status   SARS Coronavirus 2 POSITIVE (A) NEGATIVE Final    Comment: RESULT CALLED TO, READ BACK BY AND VERIFIED WITH: MEGAN JONES AT 0931 ON 2019-11-27 [facility]. (NOTE) SARS-CoV-2 target nucleic acids are DETECTED  SARS-CoV-2 RNA is generally detectable in upper respiratory specimens  during the acute phase of infection.  Positive results are indicative  of the presence of the identified virus, but do not rule out bacterial infection or co-infection  with other pathogens not detected by the test.  Clinical correlation with patient history and  other diagnostic information is necessary to determine patient infection status.  The expected result is negative.  Fact Sheet for Patients:   BoilerBrush.com.cy   Fact Sheet for Healthcare Providers:   https://pope.com/    This test is not yet approved or cleared by the [place] FDA and  has been authorized for detection and/or diagnosis of SARS-CoV-2 by FDA under an Emergency Use Authorization (EUA).  This EUA will remain in effect (meaning this  test can be used) for the duration of  the COVID-19 declaration under Section 564(b)(1) of the Act, 21 U.S.C. section 360-bbb-3(b)(1), unless the authorization is terminated or revoked sooner.  Performed at [facility], [address] [place], [place] [address]      Studies: CT ANGIO CHEST PE W OR WO CONTRAST  Result Date: 11/22/2019 CLINICAL DATA:  Increasing shortness of breath in a patient with COVID-19 infection. EXAM: CT ANGIOGRAPHY CHEST WITH CONTRAST TECHNIQUE: Multidetector CT imaging of the chest was performed using the standard protocol during bolus administration of intravenous contrast. Multiplanar CT image reconstructions and MIPs were obtained to evaluate the vascular anatomy. CONTRAST:  OMNIPAQUE IOHEXOL 350 MG/ML SOLN COMPARISON:  Chest radiograph Nov 27, 2019 FINDINGS: Cardiovascular: Satisfactory opacification of the pulmonary arteries to the segmental level. No evidence of pulmonary embolism. Mildly enlarged heart. No pericardial effusion. Mediastinum/Nodes: No enlarged mediastinal, hilar, or axillary lymph nodes. Thyroid gland, trachea, and esophagus demonstrate no significant findings. Lungs/Pleura: Widespread ground-glass and confluent airspace consolidation throughout both lungs with lower lobe and dependent distribution, consistent with moderate to severe  COVID-19 pneumonia. 7 mm solid soft tissue nodule in the right middle lobe, image 177/323. Upper Abdomen: No acute abnormality. Musculoskeletal: No chest wall abnormality. No acute or significant osseous findings. Review of the MIP images confirms the above findings. IMPRESSION: 1. No evidence of pulmonary embolus. 2. Widespread ground-glass and confluent airspace consolidation throughout both lungs with lower lobe and dependent distribution, consistent with moderate to severe COVID-19 pneumonia. 3. 7 mm solid soft tissue nodule in the right middle lobe. Non-contrast chest CT at 6-12 months is recommended. If the nodule is stable at time of repeat CT, then future CT at 18-24 months (from today's scan) is considered optional for low-risk patients, but is recommended for high-risk patients. This recommendation follows the consensus statement: Guidelines for Management of Incidental Pulmonary Nodules Detected on CT Images: From the Fleischner Society 2017; Radiology 2017; 284:228-243. 4. Mildly enlarged heart. Electronically Signed   By: Ted Mcalpine M.D.   On: 11/22/2019 12:41    Scheduled Meds: . albuterol  2 puff Inhalation  Q6H  . vitamin C  500 mg Oral Daily  . baricitinib  4 mg Oral Daily  . enoxaparin (LOVENOX) injection  1 mg/kg Subcutaneous Q12H  . ivermectin  150 mcg/kg Oral Once  . methylPREDNISolone (SOLU-MEDROL) injection  40 mg Intravenous Q12H  . sodium chloride flush  3 mL Intravenous Q12H  . traZODone  100 mg Oral QHS  . zinc sulfate  220 mg Oral Daily   Continuous Infusions: . sodium chloride 10 mL/hr at 11/22/19 1126  . remdesivir 100 mg in NS 100 mL Stopped (11/22/19 1058)    Assessment/Plan:  1. Acute hypoxic respiratory failure secondary to COVID-19 pneumonia.  Patient on high flow nasal cannula 15 L and nonrebreather mask added this morning.  Baricitinib day 3. 2. COVID-19 pneumonia on remdesivir day 4 out of 5.  Continue Solu-Medrol twice daily and albuterol inhaler.   Patient still with poor air entry.  Continue vitamin C and zinc.  In speaking with the patient's wife she wanted to try another dose of ivermectin today. 3. Elevated fibrin derivatives.  Lovenox 1 mg/kg subcu every 12 hours for now.  CT scan of the chest negative for pulmonary embolism.  Ultrasound the lower extremities pending. 4. Elevated liver function test secondary to COVID-19 5. Incarcerated ventral hernia which was reduced in the emergency room.  Follow-up with general surgery as outpatient. 6. Insomnia improved with trazodone 7. Hyponatremia resolved 8. Impaired fasting glucose 9. Weakness.  Physical therapy evaluation    Code Status:     Code Status Orders  (From admission, onward)         Start     Ordered   12/02/2019 1015  Full code  Continuous        12/08/2019 1024        Code Status History    Date Active Date Inactive Code Status Order ID Comments User Context   10/02/2015 2226 10/03/2015 1950 Full Code [id number]  Oralia Manis, MD Inpatient   Advance Care Planning Activity     Family Communication: Spoke with the patient's wife on the phone Disposition Plan: Status is: Inpatient  Dispo: The patient is from: Home              Anticipated d/c is to: Home              Anticipated d/c date is: Unable to be determined secondary to being on large amount of oxygen              Patient currently being treated for Covid pneumonia and acute hypoxic respiratory failure  Time spent: 28 minutes  Arisha [employer]

## 2019-11-22 NOTE — TOC Initial Note (Signed)
Transition of Care [facility]) - Initial/Assessment Note    Patient Details  Name: Rick Lowery MRN: [id number] Date of Birth: 1960-08-11  [facility] CM/SW Contact:    Allayne Butcher, RN Phone Number: 11/22/2019, 12:10 PM  Clinical Narrative:                 Patient admitted to the hospital with COVID currently on HFNC 15 L.  RNCM consulted for homelessness and patient does not have insurance or a PCP.   RNCM was able to reach patient's wife via phone.  Patient's wife, Boneta Lucks reports that the patient is independent at home and works at [employer] and supports the whole family with his income, which includes their 5 children.  Wife reports that since her husband became sick they have been evicted from their home and are now homeless.  Wife reports that currently they are staying at a friends office and can probably only stay for a couple of weeks.  Boneta Lucks says that they have been on the waiting list at [facility] and have been looking for a place to rent but have had no luck in finding a rental.  Wife has already used resources like 211.  RNCM is unable to assist with housing other than to provide resources.  RNCM can assist with getting patient set up with PCP and Medication Clinic.  TOC team will cont to follow and speak with patient once he is more stable.   It is also unlikely that [facility] team will be able to place in SNF due to patient having no insurance and no stable housing to discharge to.   Expected Discharge Plan: Home w Home Health Services Barriers to Discharge: Continued Medical Work up   Patient Goals and CMS Choice     Choice offered to / list presented to : NA  Expected Discharge Plan and Services Expected Discharge Plan: Home w Home Health Services   Discharge Planning Services: CM Consult, Indigent Health Clinic, Medication Assistance Post Acute Care Choice: Home Health Living arrangements for the past 2 months: Single Family Home (now homeless)                    DME Agency: NA                  Prior Living Arrangements/Services Living arrangements for the past 2 months: Single Family Home (now homeless) Lives with:: Spouse, Adult Children Patient language and need for interpreter reviewed:: Yes Do you feel safe going back to the place where you live?: Yes      Need for Family Participation in Patient Care: Yes (Comment) (COVID) Care giver support system in place?: Yes (comment) (wife and children)   Criminal Activity/Legal Involvement Pertinent to Current Situation/Hospitalization: No - Comment as needed  Activities of Daily Living Home Assistive Devices/Equipment: None ADL Screening (condition at time of admission) Patient's cognitive ability adequate to safely complete daily activities?: Yes Is the patient deaf or have difficulty hearing?: No Does the patient have difficulty seeing, even when wearing glasses/contacts?: No Does the patient have difficulty concentrating, remembering, or making decisions?: No Patient able to express need for assistance with ADLs?: Yes Does the patient have difficulty dressing or bathing?: No Independently performs ADLs?: Yes (appropriate for developmental age) Does the patient have difficulty walking or climbing stairs?: No Weakness of Legs: None Weakness of Arms/Hands: None  Permission Sought/Granted Permission sought to share information with : Case Manager, Family Supports Permission  granted to share information with : Yes, Verbal Permission Granted  Share Information with NAME: Boneta Lucks     Permission granted to share info w Relationship: wife     Emotional Assessment       Orientation: : Oriented to Self, Oriented to Place, Oriented to  Time, Oriented to Situation Alcohol / Substance Use: Not Applicable Psych Involvement: No (comment)  Admission diagnosis:  Incarcerated ventral hernia [K43.6] Generalized abdominal pain [R10.84] Hypoxia [R09.02] Fever in other diseases  [R50.81] Pneumonia due to COVID-19 virus [U07.1, J12.82] Patient Active Problem List   Diagnosis Date Noted  . Insomnia   . Impaired fasting glucose   . Elevated LFTs   . Pneumonia due to COVID-19 virus 12/13/2019  . Acute respiratory failure due to COVID-19 (HCC) 12/07/2019  . Incarcerated ventral hernia 12/17/2019  . Transaminitis 11/23/2019  . Hyponatremia 12/12/2019  . Amnesia 10/02/2015   PCP:  Patient, No Pcp Per Pharmacy:   [id number] - [place], [place] - [address] AT [facility] [address] [facility] [address] [address] [place] [address] Phone: [phone] Fax: [phone]     Social Determinants of Health (SDOH) Interventions    Readmission Risk Interventions No flowsheet data found.

## 2019-11-23 LAB — CBC WITH DIFFERENTIAL/PLATELET
Abs Immature Granulocytes: 0.16 10*3/uL — ABNORMAL HIGH (ref 0.00–0.07)
Basophils Absolute: 0 10*3/uL (ref 0.0–0.1)
Basophils Relative: 0 %
Eosinophils Absolute: 0 10*3/uL (ref 0.0–0.5)
Eosinophils Relative: 0 %
HCT: 43.2 % (ref 39.0–52.0)
Hemoglobin: 15.4 g/dL (ref 13.0–17.0)
Immature Granulocytes: 2 %
Lymphocytes Relative: 4 %
Lymphs Abs: 0.3 10*3/uL — ABNORMAL LOW (ref 0.7–4.0)
MCH: 29.7 pg (ref 26.0–34.0)
MCHC: 35.6 g/dL (ref 30.0–36.0)
MCV: 83.4 fL (ref 80.0–100.0)
Monocytes Absolute: 0.3 10*3/uL (ref 0.1–1.0)
Monocytes Relative: 4 %
Neutro Abs: 6.9 10*3/uL (ref 1.7–7.7)
Neutrophils Relative %: 90 %
Platelets: 300 10*3/uL (ref 150–400)
RBC: 5.18 MIL/uL (ref 4.22–5.81)
RDW: 14.8 % (ref 11.5–15.5)
WBC: 7.7 10*3/uL (ref 4.0–10.5)
nRBC: 0 % (ref 0.0–0.2)

## 2019-11-23 LAB — COMPREHENSIVE METABOLIC PANEL
ALT: 59 U/L — ABNORMAL HIGH (ref 0–44)
AST: 31 U/L (ref 15–41)
Albumin: 2.6 g/dL — ABNORMAL LOW (ref 3.5–5.0)
Alkaline Phosphatase: 118 U/L (ref 38–126)
Anion gap: 8 (ref 5–15)
BUN: 17 mg/dL (ref 8–23)
CO2: 26 mmol/L (ref 22–32)
Calcium: 8 mg/dL — ABNORMAL LOW (ref 8.9–10.3)
Chloride: 102 mmol/L (ref 98–111)
Creatinine, Ser: 0.81 mg/dL (ref 0.61–1.24)
GFR calc Af Amer: >60 mL/min (ref >60)
GFR calc non Af Amer: >60 mL/min (ref >60)
Glucose, Bld: 133 mg/dL — ABNORMAL HIGH (ref 70–99)
Potassium: 4.2 mmol/L (ref 3.5–5.1)
Sodium: 136 mmol/L (ref 135–145)
Total Bilirubin: 1.1 mg/dL (ref 0.3–1.2)
Total Protein: 6.2 g/dL — ABNORMAL LOW (ref 6.5–8.1)

## 2019-11-23 LAB — VITAMIN D 25 HYDROXY (VIT D DEFICIENCY, FRACTURES): Vit D, 25-Hydroxy: 113.79 ng/mL — ABNORMAL HIGH (ref 30–100)

## 2019-11-23 LAB — C-REACTIVE PROTEIN: CRP: 4.9 mg/dL — ABNORMAL HIGH (ref <1.0)

## 2019-11-23 LAB — FERRITIN: Ferritin: 358 ng/mL — ABNORMAL HIGH (ref 24–336)

## 2019-11-23 LAB — FIBRIN DERIVATIVES D-DIMER (ARMC ONLY): Fibrin derivatives D-dimer (ARMC): 3604.03 ng/mL (FEU) — ABNORMAL HIGH (ref 0.00–499.00)

## 2019-11-23 LAB — MAGNESIUM: Magnesium: 2.3 mg/dL (ref 1.7–2.4)

## 2019-11-23 LAB — PHOSPHORUS: Phosphorus: 4.2 mg/dL (ref 2.5–4.6)

## 2019-11-23 MED ORDER — FUROSEMIDE 20 MG PO TABS
20.0000 mg | ORAL_TABLET | Freq: Every day | ORAL | Status: DC
  Administered 2019-11-23 – 2019-11-27 (×5): 20 mg via ORAL
  Filled 2019-11-23 (×5): qty 1

## 2019-11-23 MED ORDER — VITAMIN D 25 MCG (1000 UNIT) PO TABS
1000.0000 [IU] | ORAL_TABLET | Freq: Every day | ORAL | Status: DC
  Administered 2019-11-23 – 2019-11-28 (×6): 1000 [IU] via ORAL
  Filled 2019-11-23 (×6): qty 1

## 2019-11-23 MED ORDER — IVERMECTIN 3 MG PO TABS
150.0000 ug/kg | ORAL_TABLET | Freq: Once | ORAL | Status: AC
  Administered 2019-11-23: 17:00:00 15000 ug via ORAL
  Filled 2019-11-23: qty 5

## 2019-11-23 MED ORDER — APIXABAN 5 MG PO TABS
5.0000 mg | ORAL_TABLET | Freq: Two times a day (BID) | ORAL | Status: DC
  Administered 2019-11-23 – 2019-11-28 (×11): 5 mg via ORAL
  Filled 2019-11-23 (×11): qty 1

## 2019-11-23 NOTE — Progress Notes (Signed)
Patient noted to remove the NRB mask and sats decreased to 79%. RN asked reminded patient that it is important to leave the mask on his face as to cover the nose and mouth so he is able to receive adequate oxygen. Patient stated, "I was taking a break from the mask". RN informed patient that without both the nasal cannula and the NRB together the patient could possible be intubated. Patient agreed to leave both oxygent supplies in place. Otherwise, patient had an uneventful night. Patient slept all night.

## 2019-11-23 NOTE — Progress Notes (Signed)
Patient ID: Rick Lowery, male   DOB: Feb 13, 1961, 59 y.o.   MRN: [id number] [facility] PROGRESS NOTE  Donovyn Guidice APO:[id number] DOB: [birth year] DOA: 11/28/2019 PCP: Patient, No Pcp Per  HPI/Subjective: Patient still short of breath.  Every time he takes of 100% nonrebreather he desaturates into the mid 80s.  Still with some cough.  Was able to sleep last night.  No chest pain.  Admitted with COVID-19 pneumonia.  Objective: Vitals:   11/23/19 0850 11/23/19 1100  BP:  (!) 144/72  Pulse:  64  Resp:  (!) 21  Temp:  98.2 F (36.8 C)  SpO2: 90% 92%    Intake/Output Summary (Last 24 hours) at 11/23/2019 1511 Last data filed at 11/23/2019 0500 Gross per 24 hour  Intake --  Output 500 ml  Net -500 ml   Filed Weights   11/29/2019 0747  Weight: 104.3 kg    ROS: Review of Systems  Respiratory: Positive for cough and shortness of breath.   Cardiovascular: Negative for chest pain.  Gastrointestinal: Negative for abdominal pain, nausea and vomiting.   Exam: Physical Exam HENT:     Nose: No mucosal edema.     Mouth/Throat:     Pharynx: No oropharyngeal exudate.  Eyes:     General: Lids are normal.     Conjunctiva/sclera: Conjunctivae normal.  Cardiovascular:     Rate and Rhythm: Normal rate and regular rhythm.     Heart sounds: Normal heart sounds, S1 normal and S2 normal.  Pulmonary:     Breath sounds: Examination of the right-middle field reveals decreased breath sounds. Examination of the left-middle field reveals decreased breath sounds. Examination of the right-lower field reveals decreased breath sounds and rhonchi. Examination of the left-lower field reveals decreased breath sounds and rhonchi. Decreased breath sounds and rhonchi present. No wheezing or rales.  Abdominal:     Palpations: Abdomen is soft.     Tenderness: There is no abdominal tenderness.  Musculoskeletal:     Right lower leg: Normal.     Left lower leg: Normal.  Skin:    General: Skin is  warm.     Findings: No rash.  Neurological:     Mental Status: He is alert and oriented to person, place, and time.       Data Reviewed: Basic Metabolic Panel: Recent Labs  Lab 11/27/2019 0759 11/20/19 0653 11/21/19 0726 11/22/19 0458 11/23/19 0600  NA 126* 135 136 135 136  K 3.7 3.7 3.7 4.0 4.2  CL 93* 100 101 103 102  CO2 22 25 25 24 26   GLUCOSE 138* 153* 145* 145* 133*  BUN 10 15 16 16 17   CREATININE 0.90 0.73 0.89 0.63 0.81  CALCIUM 8.1* 8.4* 8.3* 8.2* 8.0*  MG  --  2.4 2.6* 2.2 2.3  PHOS  --  3.6 4.3 3.7 4.2   Liver Function Tests: Recent Labs  Lab 12/10/2019 0759 11/20/19 0653 11/21/19 0726 11/22/19 0458 11/23/19 0600  AST 98* 120* 55* 38 31  ALT 72* 132* 99* 77* 59*  ALKPHOS 62 181* 158* 136* 118  BILITOT 1.3* 1.1 1.1 1.1 1.1  PROT 7.1 6.6 6.8 6.3* 6.2*  ALBUMIN 3.1* 2.7* 2.9* 2.7* 2.6*  CBC: Recent Labs  Lab 12/14/2019 0759 11/20/19 0653 11/21/19 0726 11/22/19 0458 11/23/19 0600  WBC 5.4 5.9 8.1 7.9 7.7  NEUTROABS 4.5 5.1 6.9 7.0 6.9  HGB 14.8 15.4 15.6 14.8 15.4  HCT 41.0 43.8 45.5 42.4 43.2  MCV 81.3 83.0 84.6 84.5 83.4  PLT 229 268 351 299 300     Recent Results (from the past 240 hour(s))  SARS Coronavirus 2 by RT PCR (hospital order, performed in [facility] hospital lab) Nasopharyngeal Nasopharyngeal Swab     Status: Abnormal   Collection Time: 2019/12/12  8:21 AM   Specimen: Nasopharyngeal Swab  Result Value Ref Range Status   SARS Coronavirus 2 POSITIVE (A) NEGATIVE Final    Comment: RESULT CALLED TO, READ BACK BY AND VERIFIED WITH: MEGAN JONES AT 0931 ON 2019-12-12 [facility]. (NOTE) SARS-CoV-2 target nucleic acids are DETECTED  SARS-CoV-2 RNA is generally detectable in upper respiratory specimens  during the acute phase of infection.  Positive results are indicative  of the presence of the identified virus, but do not rule out bacterial infection or co-infection with other pathogens not detected by the test.  Clinical correlation with  patient history and  other diagnostic information is necessary to determine patient infection status.  The expected result is negative.  Fact Sheet for Patients:   BoilerBrush.com.cy   Fact Sheet for Healthcare Providers:   https://pope.com/    This test is not yet approved or cleared by the [place] FDA and  has been authorized for detection and/or diagnosis of SARS-CoV-2 by FDA under an Emergency Use Authorization (EUA).  This EUA will remain in effect (meaning this  test can be used) for the duration of  the COVID-19 declaration under Section 564(b)(1) of the Act, 21 U.S.C. section 360-bbb-3(b)(1), unless the authorization is terminated or revoked sooner.  Performed at [facility], [address] [place], [place] [address]      Studies: CT ANGIO CHEST PE W OR WO CONTRAST  Result Date: 11/22/2019 CLINICAL DATA:  Increasing shortness of breath in a patient with COVID-19 infection. EXAM: CT ANGIOGRAPHY CHEST WITH CONTRAST TECHNIQUE: Multidetector CT imaging of the chest was performed using the standard protocol during bolus administration of intravenous contrast. Multiplanar CT image reconstructions and MIPs were obtained to evaluate the vascular anatomy. CONTRAST:  OMNIPAQUE IOHEXOL 350 MG/ML SOLN COMPARISON:  Chest radiograph 12/12/2019 FINDINGS: Cardiovascular: Satisfactory opacification of the pulmonary arteries to the segmental level. No evidence of pulmonary embolism. Mildly enlarged heart. No pericardial effusion. Mediastinum/Nodes: No enlarged mediastinal, hilar, or axillary lymph nodes. Thyroid gland, trachea, and esophagus demonstrate no significant findings. Lungs/Pleura: Widespread ground-glass and confluent airspace consolidation throughout both lungs with lower lobe and dependent distribution, consistent with moderate to severe COVID-19 pneumonia. 7 mm solid soft tissue nodule in the right middle lobe,  image 177/323. Upper Abdomen: No acute abnormality. Musculoskeletal: No chest wall abnormality. No acute or significant osseous findings. Review of the MIP images confirms the above findings. IMPRESSION: 1. No evidence of pulmonary embolus. 2. Widespread ground-glass and confluent airspace consolidation throughout both lungs with lower lobe and dependent distribution, consistent with moderate to severe COVID-19 pneumonia. 3. 7 mm solid soft tissue nodule in the right middle lobe. Non-contrast chest CT at 6-12 months is recommended. If the nodule is stable at time of repeat CT, then future CT at 18-24 months (from today's scan) is considered optional for low-risk patients, but is recommended for high-risk patients. This recommendation follows the consensus statement: Guidelines for Management of Incidental Pulmonary Nodules Detected on CT Images: From the Fleischner Society 2017; Radiology 2017; 284:228-243. 4. Mildly enlarged heart. Electronically Signed   By: Ted Mcalpine M.D.   On: 11/22/2019 12:41    Scheduled Meds: . albuterol  2 puff Inhalation Q6H  . apixaban  5 mg Oral BID  . vitamin C  500 mg Oral Daily  . baricitinib  4 mg Oral Daily  . ivermectin  150 mcg/kg Oral Once  . methylPREDNISolone (SOLU-MEDROL) injection  40 mg Intravenous Q12H  . sodium chloride flush  3 mL Intravenous Q12H  . traZODone  100 mg Oral QHS  . zinc sulfate  220 mg Oral Daily   Continuous Infusions: . sodium chloride 10 mL/hr at 11/22/19 1126    Assessment/Plan:  1. Acute hypoxic respiratory failure secondary to COVID-19 pneumonia.  Still on high flow nasal cannula nonrebreather.  Patient desaturates if taken off nonrebreather.  Baricitinib day 4. 2. COVID-19 pneumonia.  Finished remdesivir day 5 today.  Continue Solu-Medrol twice a day and albuterol inhaler.  Patient still with poor air entry.  In speaking with the patient's wife again today she wanted to do another dose of ivermectin. 3. Elevated fibrin  derivatives.  CT scan of the chest negative for pulmonary embolism.  Ultrasound the lower extremities preliminary report was negative.  Switched high-dose Lovenox over to Eliquis for anticoagulation. 4. Elevated liver function test secondary to COVID-19 5. Incarcerated ventral hernia which was reduced in the emergency room.  No further abdominal pain.  Follow-up with general surgery as outpatient 6. Insomnia improved with trazodone 7. Weakness.  Continued physical therapy evaluation as needed 8. Hyponatremia.  This has resolved 9. Impaired fasting glucose      Code Status:     Code Status Orders  (From admission, onward)         Start     Ordered   12/06/2019 1015  Full code  Continuous        12/15/2019 1024        Code Status History    Date Active Date Inactive Code Status Order ID Comments User Context   10/02/2015 2226 10/03/2015 1950 Full Code [id number]  Oralia Manis, MD Inpatient   Advance Care Planning Activity     Family Communication: Spoke with wife on the phone Disposition Plan: Status is: Inpatient  Dispo: The patient is from: Home              Anticipated d/c is to: Home              Anticipated d/c date is: Cannot be determined at this time being on high levels of oxygen supplementation              Patient currently being treated for acute hypoxic respiratory failure secondary to COVID-19 pneumonia.  Needs continued hospitalization secondary to oxygen requirements  Time spent: 27 minutes  Terina Mcelhinny [employer]

## 2019-11-24 DIAGNOSIS — R531 Weakness: Secondary | ICD-10-CM

## 2019-11-24 LAB — COMPREHENSIVE METABOLIC PANEL
ALT: 52 U/L — ABNORMAL HIGH (ref 0–44)
AST: 29 U/L (ref 15–41)
Albumin: 2.8 g/dL — ABNORMAL LOW (ref 3.5–5.0)
Alkaline Phosphatase: 114 U/L (ref 38–126)
Anion gap: 9 (ref 5–15)
BUN: 19 mg/dL (ref 8–23)
CO2: 25 mmol/L (ref 22–32)
Calcium: 8.5 mg/dL — ABNORMAL LOW (ref 8.9–10.3)
Chloride: 98 mmol/L (ref 98–111)
Creatinine, Ser: 0.9 mg/dL (ref 0.61–1.24)
GFR calc Af Amer: >60 mL/min (ref >60)
GFR calc non Af Amer: >60 mL/min (ref >60)
Glucose, Bld: 136 mg/dL — ABNORMAL HIGH (ref 70–99)
Potassium: 4.8 mmol/L (ref 3.5–5.1)
Sodium: 132 mmol/L — ABNORMAL LOW (ref 135–145)
Total Bilirubin: 1.2 mg/dL (ref 0.3–1.2)
Total Protein: 6.6 g/dL (ref 6.5–8.1)

## 2019-11-24 LAB — CBC WITH DIFFERENTIAL/PLATELET
Abs Immature Granulocytes: 0.38 10*3/uL — ABNORMAL HIGH (ref 0.00–0.07)
Basophils Absolute: 0 10*3/uL (ref 0.0–0.1)
Basophils Relative: 0 %
Eosinophils Absolute: 0 10*3/uL (ref 0.0–0.5)
Eosinophils Relative: 0 %
HCT: 46.3 % (ref 39.0–52.0)
Hemoglobin: 16.3 g/dL (ref 13.0–17.0)
Immature Granulocytes: 4 %
Lymphocytes Relative: 4 %
Lymphs Abs: 0.4 10*3/uL — ABNORMAL LOW (ref 0.7–4.0)
MCH: 29.5 pg (ref 26.0–34.0)
MCHC: 35.2 g/dL (ref 30.0–36.0)
MCV: 83.9 fL (ref 80.0–100.0)
Monocytes Absolute: 0.4 10*3/uL (ref 0.1–1.0)
Monocytes Relative: 4 %
Neutro Abs: 8.5 10*3/uL — ABNORMAL HIGH (ref 1.7–7.7)
Neutrophils Relative %: 88 %
Platelets: 306 10*3/uL (ref 150–400)
RBC: 5.52 MIL/uL (ref 4.22–5.81)
RDW: 14.6 % (ref 11.5–15.5)
WBC: 9.7 10*3/uL (ref 4.0–10.5)
nRBC: 0 % (ref 0.0–0.2)

## 2019-11-24 LAB — FERRITIN: Ferritin: 359 ng/mL — ABNORMAL HIGH (ref 24–336)

## 2019-11-24 LAB — MAGNESIUM: Magnesium: 2.4 mg/dL (ref 1.7–2.4)

## 2019-11-24 LAB — FIBRIN DERIVATIVES D-DIMER (ARMC ONLY): Fibrin derivatives D-dimer (ARMC): 2987.09 ng/mL (FEU) — ABNORMAL HIGH (ref 0.00–499.00)

## 2019-11-24 LAB — PHOSPHORUS: Phosphorus: 4.1 mg/dL (ref 2.5–4.6)

## 2019-11-24 LAB — C-REACTIVE PROTEIN: CRP: 7.8 mg/dL — ABNORMAL HIGH (ref <1.0)

## 2019-11-24 MED ORDER — IVERMECTIN 3 MG PO TABS
150.0000 ug/kg | ORAL_TABLET | Freq: Once | ORAL | Status: AC
  Administered 2019-11-24: 15000 ug via ORAL
  Filled 2019-11-24: qty 5

## 2019-11-24 MED ORDER — METHYLPREDNISOLONE SODIUM SUCC 125 MG IJ SOLR
80.0000 mg | Freq: Two times a day (BID) | INTRAMUSCULAR | Status: AC
  Administered 2019-11-24 – 2019-11-28 (×9): 80 mg via INTRAVENOUS
  Filled 2019-11-24 (×9): qty 2

## 2019-11-24 MED ORDER — DM-GUAIFENESIN ER 30-600 MG PO TB12
1.0000 | ORAL_TABLET | Freq: Two times a day (BID) | ORAL | Status: DC
  Administered 2019-11-24 – 2019-12-01 (×11): 1 via ORAL
  Filled 2019-11-24 (×8): qty 1
  Filled 2019-11-24: qty 2
  Filled 2019-11-24 (×3): qty 1

## 2019-11-24 NOTE — Progress Notes (Signed)
Physical Therapy Treatment Patient Details Name: Rick Lowery MRN: [id number] DOB: 1961/02/28 Today's Date: 11/24/2019    History of Present Illness Per MD note:Rick Lowery is a 59 y.o. male with medical history significant for chronic neck and back pain who presents to the emergency room for evaluation of abdominal pain mostly around the periumbilical area.  He rated his abdominal pain a 10 x 10 in intensity at its worst.  It was sudden in onset and associated with nausea but he denied having any vomiting or any changes in his bowel habits.  There was no radiation of the pain.  He denies having any urinary symptoms, no chest pain, no shortness of breath, no fever, no dizziness, no lightheadedness.    PT Comments    Pt was sitting EOB with 15 L HFNC donned + non rebreathing on. He is slightly SOB with sao2 92% at rest. Upon standing and taking steps to [facility], de-sats quickly to 84% and RR elevates to 30s. Needs several minutes sitting on BSC to recover to > 88%. Unsuccessful BM. Stood pivot back to bed. Pt again de-sats to low 80s with labored breathing. Unwilling to attempt more activity 2/2 to fatigue. Tried to convince pt to sit up in recliner however he was unwilling. He removed non re-breather to take sips of water and de-sats to 70s. PT will continue efforts to progress pt as able per POC.     Follow Up Recommendations  SNF     Equipment Recommendations  Rolling walker with 5" wheels    Recommendations for Other Services       Precautions / Restrictions Precautions Precautions: Other (comment) (COVID/ isolation) Restrictions Weight Bearing Restrictions: No    Mobility  Bed Mobility Overal bed mobility: Independent             General bed mobility comments: pt was sitting EOB upon arriving  Transfers Overall transfer level: Needs assistance Equipment used: Rolling walker (2 wheeled) Transfers: Stand Pivot Transfers Sit to Stand: Min assist Stand pivot  transfers: Min assist       General transfer comment: Min assist to stand pivot with moderate vcs. pt has poor safety awareness and likes to dictate his care. very anxious about breathing deficits.  Ambulation/Gait Ambulation/Gait assistance: Min assist Gait Distance (Feet): 3 Feet Assistive device: None Gait Pattern/deviations: Step-to pattern;Trunk flexed Gait velocity: decreased   General Gait Details: pt was able to take a few steps from EOB<> BSC. unwilling to trial further distances.         Cognition Arousal/Alertness: Awake/alert Behavior During Therapy: WFL for tasks assessed/performed Overall Cognitive Status: Within Functional Limits for tasks assessed              General Comments: Pt was alert and oriented. slightly anxious but did agree to OOB activity requesting to have BM.             Pertinent Vitals/Pain Pain Assessment: No/denies pain           PT Goals (current goals can now be found in the care plan section) Acute Rehab PT Goals Patient Stated Goal: " I want to be able to breath better." Progress towards PT goals: Progressing toward goals    Frequency    Min 2X/week      PT Plan Current plan remains appropriate       AM-PAC PT "6 Clicks" Mobility   Outcome Measure  Help needed turning from your back to your side while in a flat  bed without using bedrails?: None Help needed moving from lying on your back to sitting on the side of a flat bed without using bedrails?: None Help needed moving to and from a bed to a chair (including a wheelchair)?: A Lot Help needed standing up from a chair using your arms (e.g., wheelchair or bedside chair)?: A Lot Help needed to walk in hospital room?: A Lot Help needed climbing 3-5 steps with a railing? : A Lot 6 Click Score: 16    End of Session Equipment Utilized During Treatment: Gait belt;Oxygen (15L HFNC + non rebreather) Activity Tolerance: Patient limited by fatigue;Treatment limited  secondary to medical complications (Comment) (O2 requirements/ response limiting ) Patient left: in bed;with call bell/phone within reach;with bed alarm set Nurse Communication: Mobility status PT Visit Diagnosis: Muscle weakness (generalized) (M62.81)     Time: 2902-1115 PT Time Calculation (min) (ACUTE ONLY): 25 min  Charges:  $Therapeutic Activity: 23-37 mins                     Jetta Lout PTA 11/24/19, 3:55 PM

## 2019-11-24 NOTE — Progress Notes (Signed)
CH visited pt. per RN suggestion; pt. lying down in bed wearing nasal canula when CH arrived; pt. shared that he feels OK when lying down but becomes very short of breath and feels like he can't get any oxygen when he tries to stand up or sit on the edge of the bed; this has been the case for several days and does not seem to pt. to be improving--> this worries him; he does not know if this is normal for COVID pts or if he might get worse.  Pt. has wife and children at home.  Per pt. request, CH prayed for pt. at end of visit; Pt. expressed appreciation for [facility] support.  CH will monitor pt. needs this week.

## 2019-11-24 NOTE — Progress Notes (Signed)
Patient ID: Rick Lowery, male   DOB: 1960-11-10, 59 y.o.   MRN: [id number] [facility] PROGRESS NOTE  Rick Lowery WCB:[id number] DOB: 01-Dec-1960 DOA: 12/08/2019 PCP: Patient, No Pcp Per  HPI/Subjective: Patient on 15 L high flow nasal cannula and nonrebreather.  I saw him this morning when he took off the nonrebreather in order to take pills for the nurse.  He desaturated into the 70s just at rest.  Once we were able to put back on the nonrebreather, his oxygen saturations did come up into the high 80s low 90s.  Patient still has shortness of breath and cough.  Patient feeling weak.  Patient has some pain on the xiphoid process.  Objective: Vitals:   11/24/19 0722 11/24/19 1148  BP: 134/68 133/76  Pulse: 67 100  Resp: 18 (!) 22  Temp: 97.9 F (36.6 C) 97.7 F (36.5 C)  SpO2: 92% (!) 88%    Intake/Output Summary (Last 24 hours) at 11/24/2019 1520 Last data filed at 11/24/2019 0256 Gross per 24 hour  Intake 5.26 ml  Output 675 ml  Net -669.74 ml   Filed Weights   11/29/2019 0747  Weight: 104.3 kg    ROS: Review of Systems  Respiratory: Positive for cough, shortness of breath and wheezing.   Cardiovascular: Negative for chest pain.  Gastrointestinal: Negative for abdominal pain, nausea and vomiting.   Exam: Physical Exam HENT:     Nose: No mucosal edema.     Mouth/Throat:     Pharynx: No oropharyngeal exudate.  Eyes:     General: Lids are normal.     Conjunctiva/sclera: Conjunctivae normal.  Cardiovascular:     Rate and Rhythm: Normal rate and regular rhythm.     Heart sounds: Normal heart sounds, S1 normal and S2 normal.  Pulmonary:     Breath sounds: Examination of the right-middle field reveals decreased breath sounds. Examination of the left-middle field reveals decreased breath sounds. Examination of the right-lower field reveals decreased breath sounds and rhonchi. Examination of the left-lower field reveals decreased breath sounds and rhonchi.  Decreased breath sounds and rhonchi present. No wheezing or rales.  Abdominal:     Palpations: Abdomen is soft.     Tenderness: There is no abdominal tenderness.  Musculoskeletal:     Right lower leg: No swelling.     Left lower leg: No swelling.  Skin:    General: Skin is warm.     Findings: No rash.  Neurological:     Mental Status: He is alert and oriented to person, place, and time.       Data Reviewed: Basic Metabolic Panel: Recent Labs  Lab 11/20/19 0653 11/21/19 0726 11/22/19 0458 11/23/19 0600 11/24/19 0545  NA 135 136 135 136 132*  K 3.7 3.7 4.0 4.2 4.8  CL 100 101 103 102 98  CO2 25 25 24 26 25   GLUCOSE 153* 145* 145* 133* 136*  BUN 15 16 16 17 19   CREATININE 0.73 0.89 0.63 0.81 0.90  CALCIUM 8.4* 8.3* 8.2* 8.0* 8.5*  MG 2.4 2.6* 2.2 2.3 2.4  PHOS 3.6 4.3 3.7 4.2 4.1   Liver Function Tests: Recent Labs  Lab 11/20/19 0653 11/21/19 0726 11/22/19 0458 11/23/19 0600 11/24/19 0545  AST 120* 55* 38 31 29  ALT 132* 99* 77* 59* 52*  ALKPHOS 181* 158* 136* 118 114  BILITOT 1.1 1.1 1.1 1.1 1.2  PROT 6.6 6.8 6.3* 6.2* 6.6  ALBUMIN 2.7* 2.9* 2.7* 2.6* 2.8*   CBC: Recent  Labs  Lab 11/20/19 0653 11/21/19 0726 11/22/19 0458 11/23/19 0600 11/24/19 0545  WBC 5.9 8.1 7.9 7.7 9.7  NEUTROABS 5.1 6.9 7.0 6.9 8.5*  HGB 15.4 15.6 14.8 15.4 16.3  HCT 43.8 45.5 42.4 43.2 46.3  MCV 83.0 84.6 84.5 83.4 83.9  PLT 268 351 299 300 306     Recent Results (from the past 240 hour(s))  SARS Coronavirus 2 by RT PCR (hospital order, performed in [facility] hospital lab) Nasopharyngeal Nasopharyngeal Swab     Status: Abnormal   Collection Time: 12-11-2019  8:21 AM   Specimen: Nasopharyngeal Swab  Result Value Ref Range Status   SARS Coronavirus 2 POSITIVE (A) NEGATIVE Final    Comment: RESULT CALLED TO, READ BACK BY AND VERIFIED WITH: MEGAN JONES AT 0931 ON 12/11/2019 [facility]. (NOTE) SARS-CoV-2 target nucleic acids are DETECTED  SARS-CoV-2 RNA is generally detectable in  upper respiratory specimens  during the acute phase of infection.  Positive results are indicative  of the presence of the identified virus, but do not rule out bacterial infection or co-infection with other pathogens not detected by the test.  Clinical correlation with patient history and  other diagnostic information is necessary to determine patient infection status.  The expected result is negative.  Fact Sheet for Patients:   BoilerBrush.com.cy   Fact Sheet for Healthcare Providers:   https://pope.com/    This test is not yet approved or cleared by the [place] FDA and  has been authorized for detection and/or diagnosis of SARS-CoV-2 by FDA under an Emergency Use Authorization (EUA).  This EUA will remain in effect (meaning this  test can be used) for the duration of  the COVID-19 declaration under Section 564(b)(1) of the Act, 21 U.S.C. section 360-bbb-3(b)(1), unless the authorization is terminated or revoked sooner.  Performed at [facility], [address] [place], [place] [address]      Scheduled Meds: . albuterol  2 puff Inhalation Q6H  . apixaban  5 mg Oral BID  . vitamin C  500 mg Oral Daily  . baricitinib  4 mg Oral Daily  . cholecalciferol  1,000 Units Oral Daily  . furosemide  20 mg Oral Daily  . ivermectin  150 mcg/kg Oral Once  . methylPREDNISolone (SOLU-MEDROL) injection  80 mg Intravenous Q12H  . sodium chloride flush  3 mL Intravenous Q12H  . traZODone  100 mg Oral QHS  . zinc sulfate  220 mg Oral Daily   Continuous Infusions: . sodium chloride Stopped (11/23/19 1030)    Assessment/Plan:  1. Acute hypoxic respiratory failure secondary to COVID-19 pneumonia.  Patient on high flow nasal cannula 15 L and 100% nonrebreather.  Patient desaturates once he takes off his nonrebreather into the 70s.  It takes a while for him to recover.  Continue baricitinib day 5.  Encouraged sitting in the  chair and prone position. 2. COVID-19 pneumonia.  Completed 5 days of remdesivir.  Increase Solu-Medrol dosing 80 mg twice daily.  Continue albuterol inhaler.  Patient with poor air entry.  Advised to prone and sit in a chair.  As per the wife, wants to try a another dose of ivermectin (day 4). 3. Elevated fibrin derivatives.  CT scan of the chest negative for pulmonary embolism.  Ultrasound preliminary report was negative but not officially read in the computer.  I spoke with radiology to get this officially read into the computer shortly.  Patient was initially given high-dose Lovenox and switched over to Eliquis for  empiric anticoagulation.  Fibrin derivatives have decreased once oral anticoagulation started. 4. Elevated liver function test secondary to COVID-19.  AST now in the normal range ALT only slightly elevated. 5. Incarcerated ventral hernia which was reduced in the emergency room.  Follow-up with general surgery as outpatient.  No current abdominal pain. 6. Insomnia improved with trazodone. 7. Weakness.  Physical therapy recommends rehab.  This likely will not be an option will have to go home once improved 8. Hyponatremia resolved. 9. Impaired fasting glucose       Code Status:     Code Status Orders  (From admission, onward)         Start     Ordered   12/01/2019 1015  Full code  Continuous        12/08/2019 1024        Code Status History    Date Active Date Inactive Code Status Order ID Comments User Context   10/02/2015 2226 10/03/2015 1950 Full Code [id number]  Oralia Manis, MD Inpatient   Advance Care Planning Activity     Family Communication: Spoke with wife on the phone Disposition Plan: Status is: Inpatient  Dispo: The patient is from: Home              Anticipated d/c is to: Home              Anticipated d/c date is: Unable to be determined at this point time because he is requiring so much oxygen              Patient currently being treated for COVID-19  pneumonia and acute hypoxic respiratory failure currently on 100% nonrebreather and 15 L high flow nasal cannula  Time spent: 28 minutes  Rick Lowery [employer]

## 2019-11-25 ENCOUNTER — Inpatient Hospital Stay: Payer: HRSA Program

## 2019-11-25 LAB — PROCALCITONIN: Procalcitonin: <0.1 ng/mL

## 2019-11-25 NOTE — Progress Notes (Signed)
Wife given daily update 

## 2019-11-25 NOTE — Progress Notes (Signed)
PROGRESS NOTE    Rick Lowery  VZD:[id number] DOB: Oct 23, 1960 DOA: 11/23/19 PCP: Patient, No Pcp Per   Brief Narrative:   Subjective: Still SOB with minor exertion. Desaturating with eating as have to take non breather off.Difficult living situation.  Apparently patient was evicted from his home and has no place to go along with his family.  Assessment & Plan:   Principal Problem:   Pneumonia due to COVID-19 virus Active Problems:   Acute respiratory failure due to COVID-19 [facility])   Incarcerated ventral hernia   Transaminitis   Hyponatremia   Elevated LFTs   Insomnia   Impaired fasting glucose   Weakness  Acute hypoxic respiratory failure secondary to COVID-19 infection.  Patient continued to required high level of oxygen.  Currently saturating in mid 90s on 15 L plus non rebreather.  Completed a course of remdesivir.  Also received 1 dose of ivermectin.  Repeated chest x-ray today with stable atypical pneumonia. - repeated procalcitonin today which was negative. -Continue baricitinib-day 6 -Continue Solu-Medrol. -Continue supplemental oxygen with nonrebreather to maintain saturation above 88 to 90%. -Aggressive pulmonary toileting. -Chest PT. -Continue supportive care and supplements. -Encourage proning. -Patient was started on Eliquis empirically, we can continue while in the hospital.  CTA negative for PE.  Transaminitis.  Most likely secondary to COVID-19 infection.  Liver enzymes improving. -Continue to monitor.  Physical deconditioning.  PT is recommending SNF placement.  Patient will be a difficult for disposition as he is uninsured.  Currently homeless. -Continue with PT while in the hospital. -TOC to look for disposition once some improvement in his oxygen requirement.  Currently requiring a lot which is not safe for discharge.  Objective: Vitals:   11/24/19 1954 11/24/19 2336 11/25/19 0428 11/25/19 0858  BP: 133/82 140/76 118/79 123/69  Pulse: 80 70 74  87  Resp: 18 17 19 18   Temp: 97.8 F (36.6 C) 98.6 F (37 C) 97.7 F (36.5 C) 98.1 F (36.7 C)  TempSrc: Oral Oral Oral   SpO2: (!) 89% 92% 91% 92%  Weight:      Height:        Intake/Output Summary (Last 24 hours) at 11/25/2019 0925 Last data filed at 11/24/2019 1125 Gross per 24 hour  Intake --  Output 175 ml  Net -175 ml   Filed Weights   2019-11-23 0747  Weight: 104.3 kg    Examination:  General exam: Appears calm and comfortable  Respiratory system: Clear to auscultation. Respiratory effort normal. Cardiovascular system: S1 & S2 heard, RRR. No JVD, murmurs, rubs, Gastrointestinal system: Soft, nontender, nondistended, bowel sounds positive. Central nervous system: Alert and oriented. No focal neurological deficits. Extremities: No edema, no cyanosis, pulses intact and symmetrical. Psychiatry: Judgement and insight appear normal. Mood & affect appropriate.    DVT prophylaxis: Eliquis Code Status: Full Family Communication: Wife was updated on phone.  She want 01/19/20 to start him on some unknown supplements stating that they are good for lung inflammation.  Explained to her that is not the protocol.  Disposition Plan:  Status is: Inpatient  Remains inpatient appropriate because:Inpatient level of care appropriate due to severity of illness   Dispo: The patient is from: Home              Anticipated d/c is to: Home              Anticipated d/c date is: 3 days              Patient  currently is not medically stable to d/c.   Consultants:   None  Procedures:  Antimicrobials:   Data Reviewed: I have personally reviewed following labs and imaging studies  CBC: Recent Labs  Lab 11/20/19 0653 11/21/19 0726 11/22/19 0458 11/23/19 0600 11/24/19 0545  WBC 5.9 8.1 7.9 7.7 9.7  NEUTROABS 5.1 6.9 7.0 6.9 8.5*  HGB 15.4 15.6 14.8 15.4 16.3  HCT 43.8 45.5 42.4 43.2 46.3  MCV 83.0 84.6 84.5 83.4 83.9  PLT 268 351 299 300 306   Basic Metabolic Panel: Recent Labs   Lab 11/20/19 0653 11/21/19 0726 11/22/19 0458 11/23/19 0600 11/24/19 0545  NA 135 136 135 136 132*  K 3.7 3.7 4.0 4.2 4.8  CL 100 101 103 102 98  CO2 25 25 24 26 25   GLUCOSE 153* 145* 145* 133* 136*  BUN 15 16 16 17 19   CREATININE 0.73 0.89 0.63 0.81 0.90  CALCIUM 8.4* 8.3* 8.2* 8.0* 8.5*  MG 2.4 2.6* 2.2 2.3 2.4  PHOS 3.6 4.3 3.7 4.2 4.1   GFR: Estimated Creatinine Clearance: 100.9 mL/min (by C-G formula based on SCr of 0.9 mg/dL). Liver Function Tests: Recent Labs  Lab 11/20/19 0653 11/21/19 0726 11/22/19 0458 11/23/19 0600 11/24/19 0545  AST 120* 55* 38 31 29  ALT 132* 99* 77* 59* 52*  ALKPHOS 181* 158* 136* 118 114  BILITOT 1.1 1.1 1.1 1.1 1.2  PROT 6.6 6.8 6.3* 6.2* 6.6  ALBUMIN 2.7* 2.9* 2.7* 2.6* 2.8*   No results for input(s): LIPASE, AMYLASE in the last 168 hours. No results for input(s): AMMONIA in the last 168 hours. Coagulation Profile: No results for input(s): INR, PROTIME in the last 168 hours. Cardiac Enzymes: No results for input(s): CKTOTAL, CKMB, CKMBINDEX, TROPONINI in the last 168 hours. BNP (last 3 results) No results for input(s): PROBNP in the last 8760 hours. HbA1C: No results for input(s): HGBA1C in the last 72 hours. CBG: No results for input(s): GLUCAP in the last 168 hours. Lipid Profile: No results for input(s): CHOL, HDL, LDLCALC, TRIG, CHOLHDL, LDLDIRECT in the last 72 hours. Thyroid Function Tests: No results for input(s): TSH, T4TOTAL, FREET4, T3FREE, THYROIDAB in the last 72 hours. Anemia Panel: Recent Labs    11/23/19 0600 11/24/19 0545  FERRITIN 358* 359*   Sepsis Labs: Recent Labs  Lab 12/14/2019 0820  LATICACIDVEN 1.5    Recent Results (from the past 240 hour(s))  SARS Coronavirus 2 by RT PCR (hospital order, performed in [facility] hospital lab) Nasopharyngeal Nasopharyngeal Swab     Status: Abnormal   Collection Time: 12/02/2019  8:21 AM   Specimen: Nasopharyngeal Swab  Result Value Ref Range Status   SARS  Coronavirus 2 POSITIVE (A) NEGATIVE Final    Comment: RESULT CALLED TO, READ BACK BY AND VERIFIED WITH: MEGAN JONES AT 0931 ON 11/21/2019 [facility]. (NOTE) SARS-CoV-2 target nucleic acids are DETECTED  SARS-CoV-2 RNA is generally detectable in upper respiratory specimens  during the acute phase of infection.  Positive results are indicative  of the presence of the identified virus, but do not rule out bacterial infection or co-infection with other pathogens not detected by the test.  Clinical correlation with patient history and  other diagnostic information is necessary to determine patient infection status.  The expected result is negative.  Fact Sheet for Patients:   01/19/20   Fact Sheet for Healthcare Providers:   01/19/2020    This test is not yet approved or cleared by the [employer] FDA and  has been authorized for detection and/or diagnosis of SARS-CoV-2 by FDA under an Emergency Use Authorization (EUA).  This EUA will remain in effect (meaning this  test can be used) for the duration of  the COVID-19 declaration under Section 564(b)(1) of the Act, 21 U.S.C. section 360-bbb-3(b)(1), unless the authorization is terminated or revoked sooner.  Performed at [facility], [address] [place], [place] [address]      Radiology Studies: US Venous Img Lower Bilateral (DVT)  Result Date: 11/24/2019 CLINICAL DATA:  Shortness of breath. EXAM: BILATERAL LOWER EXTREMITY VENOUS DOPPLER ULTRASOUND TECHNIQUE: Gray-scale sonography with compression, as well as color and duplex ultrasound, were performed to evaluate the deep venous system(s) from the level of the common femoral vein through the popliteal and proximal calf veins. COMPARISON:  None. FINDINGS: VENOUS Normal compressibility of the common femoral, superficial femoral, and popliteal veins, as well as the visualized calf veins. Visualized portions of profunda  femoral vein and great saphenous vein unremarkable. No filling defects to suggest DVT on grayscale or color Doppler imaging. Doppler waveforms show normal direction of venous flow, normal respiratory plasticity and response to augmentation. OTHER None. Limitations: none IMPRESSION: Negative. Electronically Signed   By: Katherine Mantle M.D.   On: 11/24/2019 15:35   DG Chest Port 1 View  Result Date: 11/25/2019 CLINICAL DATA:  Shortness of breath EXAM: PORTABLE CHEST 1 VIEW COMPARISON:  CT from 3 days ago FINDINGS: Atypical pneumonia with asymmetric involvement on the left. Cardiomegaly. No visible effusion or pneumothorax. IMPRESSION: Atypical pneumonia without progression since study 6 days ago. Cardiomegaly Electronically Signed   By: Marnee Spring M.D.   On: 11/25/2019 06:55    Scheduled Meds: . albuterol  2 puff Inhalation Q6H  . apixaban  5 mg Oral BID  . vitamin C  500 mg Oral Daily  . baricitinib  4 mg Oral Daily  . cholecalciferol  1,000 Units Oral Daily  . dextromethorphan-guaiFENesin  1 tablet Oral BID  . furosemide  20 mg Oral Daily  . methylPREDNISolone (SOLU-MEDROL) injection  80 mg Intravenous Q12H  . sodium chloride flush  3 mL Intravenous Q12H  . traZODone  100 mg Oral QHS  . zinc sulfate  220 mg Oral Daily   Continuous Infusions: . sodium chloride Stopped (11/23/19 1030)     LOS: 6 days   Time spent: 40 minutes.  Arnetha Courser, MD [facility]  If 7PM-7AM, please contact night-coverage [email or web address]  11/25/2019, 9:25 AM   This record has been created using Dragon voice recognition software. Errors have been sought and corrected,but may not always be located. Such creation errors do not reflect on the standard of care.

## 2019-11-25 NOTE — Progress Notes (Signed)
Tried calling wife to give daily update but no answer

## 2019-11-26 LAB — COMPREHENSIVE METABOLIC PANEL
ALT: 42 U/L (ref 0–44)
AST: 29 U/L (ref 15–41)
Albumin: 2.7 g/dL — ABNORMAL LOW (ref 3.5–5.0)
Alkaline Phosphatase: 109 U/L (ref 38–126)
Anion gap: 10 (ref 5–15)
BUN: 22 mg/dL (ref 8–23)
CO2: 26 mmol/L (ref 22–32)
Calcium: 8.5 mg/dL — ABNORMAL LOW (ref 8.9–10.3)
Chloride: 94 mmol/L — ABNORMAL LOW (ref 98–111)
Creatinine, Ser: 0.86 mg/dL (ref 0.61–1.24)
GFR calc Af Amer: >60 mL/min (ref >60)
GFR calc non Af Amer: >60 mL/min (ref >60)
Glucose, Bld: 140 mg/dL — ABNORMAL HIGH (ref 70–99)
Potassium: 4.6 mmol/L (ref 3.5–5.1)
Sodium: 130 mmol/L — ABNORMAL LOW (ref 135–145)
Total Bilirubin: 1.3 mg/dL — ABNORMAL HIGH (ref 0.3–1.2)
Total Protein: 6.6 g/dL (ref 6.5–8.1)

## 2019-11-26 LAB — CBC
HCT: 47.4 % (ref 39.0–52.0)
Hemoglobin: 16.7 g/dL (ref 13.0–17.0)
MCH: 28.8 pg (ref 26.0–34.0)
MCHC: 35.2 g/dL (ref 30.0–36.0)
MCV: 81.9 fL (ref 80.0–100.0)
Platelets: 295 10*3/uL (ref 150–400)
RBC: 5.79 MIL/uL (ref 4.22–5.81)
RDW: 14.6 % (ref 11.5–15.5)
WBC: 12.4 10*3/uL — ABNORMAL HIGH (ref 4.0–10.5)
nRBC: 0 % (ref 0.0–0.2)

## 2019-11-26 MED ORDER — FLUTICASONE PROPIONATE 50 MCG/ACT NA SUSP
1.0000 | Freq: Every day | NASAL | Status: DC
  Administered 2019-11-26 – 2019-12-01 (×5): 1 via NASAL
  Filled 2019-11-26 (×2): qty 16

## 2019-11-26 MED ORDER — SODIUM CHLORIDE 0.9 % IV SOLN: INTRAVENOUS | Status: AC

## 2019-11-26 MED ORDER — PHENYLEPHRINE HCL 0.5 % NA SOLN
1.0000 [drp] | Freq: Four times a day (QID) | NASAL | Status: AC | PRN
  Filled 2019-11-26: qty 15

## 2019-11-26 MED ORDER — SALINE SPRAY 0.65 % NA SOLN
1.0000 | NASAL | Status: DC | PRN
  Filled 2019-11-26: qty 44

## 2019-11-26 NOTE — Progress Notes (Signed)
PROGRESS NOTE    Rick Lowery  KZS:[id number] DOB: 1960/06/11 DOA: 12/04/2019 PCP: Patient, No Pcp Per   Brief Narrative: Taken from H&P Rick Lowery is a 59 y.o. male with medical history significant for chronic neck and back pain who presents to the emergency room for evaluation of abdominal pain mostly around the periumbilical area. Found to be hypoxic and positive for COVID-19.  Patient is unvaccinated and denies any sick contacts.  Chest x-ray with bilateral infiltrates consistent with COVID-19 pneumonia.  Completed a course of remdesivir.  Continue to require higher level of oxygen.  Subjective: No new complaints. Continue to desaturate whenever taking off NB.  Assessment & Plan:   Principal Problem:   Pneumonia due to COVID-19 virus Active Problems:   Acute respiratory failure due to COVID-19 [facility])   Incarcerated ventral hernia   Transaminitis   Hyponatremia   Elevated LFTs   Insomnia   Impaired fasting glucose   Weakness  Acute hypoxic respiratory failure secondary to COVID-19 infection.  Patient continued to required high level of oxygen.  Currently saturating in mid 90s on 15 L plus non rebreather.  Completed a course of remdesivir.  Also received 1 dose of ivermectin.  Repeated chest x-ray today with stable atypical pneumonia. - repeated procalcitonin today which was negative. -Continue baricitinib-day 7 -Continue Solu-Medrol. -Transfer him to progressive unit for heated HFNC and see if that helps. -Aggressive pulmonary toileting. -Chest PT. -Continue supportive care and supplements. -Encourage proning. -Patient was started on Eliquis empirically, we can continue while in the hospital.  CTA negative for PE.  Transaminitis.  Most likely secondary to COVID-19 infection.  Liver enzymes improving. -Continue to monitor.  Physical deconditioning.  PT is recommending SNF placement.  Patient will be a difficult for disposition as he is uninsured.  Currently  homeless. -Continue with PT while in the hospital. -TOC to look for disposition once some improvement in his oxygen requirement.  Currently requiring a lot of oxygen which is not safe for discharge.  Objective: Vitals:   11/25/19 2346 11/26/19 0302 11/26/19 0401 11/26/19 0756  BP: 123/67 (!) 112/49 118/73 138/74  Pulse: 77 77 69 75  Resp: 18 20 19 18   Temp: 98 F (36.7 C) 97.8 F (36.6 C) 97.8 F (36.6 C) 97.7 F (36.5 C)  TempSrc:  Oral  Axillary  SpO2: 97% (!) 87% 97% 93%  Weight:      Height:        Intake/Output Summary (Last 24 hours) at 11/26/2019 0901 Last data filed at 11/26/2019 0858 Gross per 24 hour  Intake --  Output 825 ml  Net -825 ml   Filed Weights   11/20/2019 0747  Weight: 104.3 kg    Examination:  General.  Chronically ill-appearing gentleman, in no acute distress. Pulmonary.  Lungs clear bilaterally, normal respiratory effort. CV.  Regular rate and rhythm, no JVD, rub or murmur. Abdomen.  Soft, nontender, nondistended, BS positive. CNS.  Alert and oriented x3.  No focal neurologic deficit. Extremities.  No edema, no cyanosis, pulses intact and symmetrical. Psychiatry.  Judgment and insight appears normal.  DVT prophylaxis: Eliquis Code Status: Full Family Communication:  Wife was updated on phone.   Disposition Plan:  Status is: Inpatient  Remains inpatient appropriate because:Inpatient level of care appropriate due to severity of illness   Dispo: The patient is from: Home              Anticipated d/c is to: Home  Anticipated d/c date is: 3 days              Patient currently is not medically stable to d/c.  Patient is currently on heated HFNC, 50 L 100% FiO2.  He is high risk for deterioration, intubation or death.  Discussed with wife she does not want intubation at this time and will try calling her husband and discussing with him.   Consultants:   None  Procedures:  Antimicrobials:   Data Reviewed: I have personally  reviewed following labs and imaging studies  CBC: Recent Labs  Lab 11/20/19 0653 11/20/19 0653 11/21/19 0726 11/22/19 0458 11/23/19 0600 11/24/19 0545 11/26/19 0611  WBC 5.9   < > 8.1 7.9 7.7 9.7 12.4*  NEUTROABS 5.1  --  6.9 7.0 6.9 8.5*  --   HGB 15.4   < > 15.6 14.8 15.4 16.3 16.7  HCT 43.8   < > 45.5 42.4 43.2 46.3 47.4  MCV 83.0   < > 84.6 84.5 83.4 83.9 81.9  PLT 268   < > 351 299 300 306 295   < > = values in this interval not displayed.   Basic Metabolic Panel: Recent Labs  Lab 11/20/19 0653 11/20/19 0653 11/21/19 0726 11/22/19 0458 11/23/19 0600 11/24/19 0545 11/26/19 0611  NA 135   < > 136 135 136 132* 130*  K 3.7   < > 3.7 4.0 4.2 4.8 4.6  CL 100   < > 101 103 102 98 94*  CO2 25   < > 25 24 26 25 26   GLUCOSE 153*   < > 145* 145* 133* 136* 140*  BUN 15   < > 16 16 17 19 22   CREATININE 0.73   < > 0.89 0.63 0.81 0.90 0.86  CALCIUM 8.4*   < > 8.3* 8.2* 8.0* 8.5* 8.5*  MG 2.4  --  2.6* 2.2 2.3 2.4  --   PHOS 3.6  --  4.3 3.7 4.2 4.1  --    < > = values in this interval not displayed.   GFR: Estimated Creatinine Clearance: 105.6 mL/min (by C-G formula based on SCr of 0.86 mg/dL). Liver Function Tests: Recent Labs  Lab 11/21/19 0726 11/22/19 0458 11/23/19 0600 11/24/19 0545 11/26/19 0611  AST 55* 38 31 29 29   ALT 99* 77* 59* 52* 42  ALKPHOS 158* 136* 118 114 109  BILITOT 1.1 1.1 1.1 1.2 1.3*  PROT 6.8 6.3* 6.2* 6.6 6.6  ALBUMIN 2.9* 2.7* 2.6* 2.8* 2.7*   No results for input(s): LIPASE, AMYLASE in the last 168 hours. No results for input(s): AMMONIA in the last 168 hours. Coagulation Profile: No results for input(s): INR, PROTIME in the last 168 hours. Cardiac Enzymes: No results for input(s): CKTOTAL, CKMB, CKMBINDEX, TROPONINI in the last 168 hours. BNP (last 3 results) No results for input(s): PROBNP in the last 8760 hours. HbA1C: No results for input(s): HGBA1C in the last 72 hours. CBG: No results for input(s): GLUCAP in the last 168  hours. Lipid Profile: No results for input(s): CHOL, HDL, LDLCALC, TRIG, CHOLHDL, LDLDIRECT in the last 72 hours. Thyroid Function Tests: No results for input(s): TSH, T4TOTAL, FREET4, T3FREE, THYROIDAB in the last 72 hours. Anemia Panel: Recent Labs    11/24/19 0545  FERRITIN 359*   Sepsis Labs: Recent Labs  Lab 11/25/19 0945  PROCALCITON <0.10    Recent Results (from the past 240 hour(s))  SARS Coronavirus 2 by RT PCR (hospital order, performed in [facility]  hospital lab) Nasopharyngeal Nasopharyngeal Swab     Status: Abnormal   Collection Time: 12/09/2019  8:21 AM   Specimen: Nasopharyngeal Swab  Result Value Ref Range Status   SARS Coronavirus 2 POSITIVE (A) NEGATIVE Final    Comment: RESULT CALLED TO, READ BACK BY AND VERIFIED WITH: MEGAN JONES AT 0931 ON 11/21/2019 [facility]. (NOTE) SARS-CoV-2 target nucleic acids are DETECTED  SARS-CoV-2 RNA is generally detectable in upper respiratory specimens  during the acute phase of infection.  Positive results are indicative  of the presence of the identified virus, but do not rule out bacterial infection or co-infection with other pathogens not detected by the test.  Clinical correlation with patient history and  other diagnostic information is necessary to determine patient infection status.  The expected result is negative.  Fact Sheet for Patients:   [employer]https://www.fda.gov/media/136312/download   Fact Sheet for Healthcare Providers:   https://pope.com/https://www.fda.gov/media/136313/download    This test is not yet approved or cleared by the [place] FDA and  has been authorized for detection and/or diagnosis of SARS-CoV-2 by FDA under an Emergency Use Authorization (EUA).  This EUA will remain in effect (meaning this  test can be used) for the duration of  the COVID-19 declaration under Section 564(b)(1) of the Act, 21 U.S.C. section 360-bbb-3(b)(1), unless the authorization is terminated or revoked sooner.  Performed at [facility], [address] [place], [place] [address]      Radiology Studies: US Venous Img Lower Bilateral (DVT)  Result Date: 11/24/2019 CLINICAL DATA:  Shortness of breath. EXAM: BILATERAL LOWER EXTREMITY VENOUS DOPPLER ULTRASOUND TECHNIQUE: Gray-scale sonography with compression, as well as color and duplex ultrasound, were performed to evaluate the deep venous system(s) from the level of the common femoral vein through the popliteal and proximal calf veins. COMPARISON:  None. FINDINGS: VENOUS Normal compressibility of the common femoral, superficial femoral, and popliteal veins, as well as the visualized calf veins. Visualized portions of profunda femoral vein and great saphenous vein unremarkable. No filling defects to suggest DVT on grayscale or color Doppler imaging. Doppler waveforms show normal direction of venous flow, normal respiratory plasticity and response to augmentation. OTHER None. Limitations: none IMPRESSION: Negative. Electronically Signed   By: Katherine Mantlehristopher  Green M.D.   On: 11/24/2019 15:35   DG Chest Port 1 View  Result Date: 11/25/2019 CLINICAL DATA:  Shortness of breath EXAM: PORTABLE CHEST 1 VIEW COMPARISON:  CT from 3 days ago FINDINGS: Atypical pneumonia with asymmetric involvement on the left. Cardiomegaly. No visible effusion or pneumothorax. IMPRESSION: Atypical pneumonia without progression since study 6 days ago. Cardiomegaly Electronically Signed   By: Marnee SpringJonathon  Watts M.D.   On: 11/25/2019 06:55    Scheduled Meds: . albuterol  2 puff Inhalation Q6H  . apixaban  5 mg Oral BID  . vitamin C  500 mg Oral Daily  . baricitinib  4 mg Oral Daily  . cholecalciferol  1,000 Units Oral Daily  . dextromethorphan-guaiFENesin  1 tablet Oral BID  . fluticasone  1 spray Each Nare Daily  . furosemide  20 mg Oral Daily  . methylPREDNISolone (SOLU-MEDROL) injection  80 mg Intravenous Q12H  . sodium chloride flush  3 mL Intravenous Q12H  . traZODone  100 mg Oral QHS  . zinc  sulfate  220 mg Oral Daily   Continuous Infusions: . sodium chloride Stopped (11/23/19 1030)     LOS: 7 days   Time spent: 40 minutes.  Arnetha CourserSumayya Gerren Hoffmeier, MD [facility]  If 7PM-7AM, please  contact night-coverage [email or web address]  11/26/2019, 9:01 AM   This record has been created using Conservation officer, historic buildings. Errors have been sought and corrected,but may not always be located. Such creation errors do not reflect on the standard of care.

## 2019-11-26 NOTE — Progress Notes (Signed)
Pt being transferred to PCU report given to [facility], pt with no complaints, o2 in place

## 2019-11-26 NOTE — Progress Notes (Signed)
Wife made aware of transfer to PCU when bed is available

## 2019-11-26 NOTE — Progress Notes (Signed)
Wife given daily update

## 2019-11-27 ENCOUNTER — Inpatient Hospital Stay: Payer: HRSA Program

## 2019-11-27 DIAGNOSIS — U071 COVID-19: Principal | ICD-10-CM

## 2019-11-27 DIAGNOSIS — J1282 Pneumonia due to coronavirus disease 2019: Secondary | ICD-10-CM

## 2019-11-27 LAB — COMPREHENSIVE METABOLIC PANEL
ALT: 36 U/L (ref 0–44)
AST: 27 U/L (ref 15–41)
Albumin: 2.4 g/dL — ABNORMAL LOW (ref 3.5–5.0)
Alkaline Phosphatase: 85 U/L (ref 38–126)
Anion gap: 10 (ref 5–15)
BUN: 23 mg/dL (ref 8–23)
CO2: 24 mmol/L (ref 22–32)
Calcium: 8.1 mg/dL — ABNORMAL LOW (ref 8.9–10.3)
Chloride: 97 mmol/L — ABNORMAL LOW (ref 98–111)
Creatinine, Ser: 0.8 mg/dL (ref 0.61–1.24)
GFR calc Af Amer: >60 mL/min (ref >60)
GFR calc non Af Amer: >60 mL/min (ref >60)
Glucose, Bld: 132 mg/dL — ABNORMAL HIGH (ref 70–99)
Potassium: 4.6 mmol/L (ref 3.5–5.1)
Sodium: 131 mmol/L — ABNORMAL LOW (ref 135–145)
Total Bilirubin: 1.3 mg/dL — ABNORMAL HIGH (ref 0.3–1.2)
Total Protein: 6 g/dL — ABNORMAL LOW (ref 6.5–8.1)

## 2019-11-27 LAB — CBC
HCT: 43.8 % (ref 39.0–52.0)
Hemoglobin: 15.9 g/dL (ref 13.0–17.0)
MCH: 30.2 pg (ref 26.0–34.0)
MCHC: 36.3 g/dL — ABNORMAL HIGH (ref 30.0–36.0)
MCV: 83.1 fL (ref 80.0–100.0)
Platelets: 257 10*3/uL (ref 150–400)
RBC: 5.27 MIL/uL (ref 4.22–5.81)
RDW: 14.6 % (ref 11.5–15.5)
WBC: 12.3 10*3/uL — ABNORMAL HIGH (ref 4.0–10.5)
nRBC: 0 % (ref 0.0–0.2)

## 2019-11-27 LAB — BLOOD GAS, ARTERIAL
Acid-Base Excess: 2.5 mmol/L — ABNORMAL HIGH (ref 0.0–2.0)
Bicarbonate: 25 mmol/L (ref 20.0–28.0)
FIO2: 100
O2 Saturation: 90.6 %
Patient temperature: 37
pCO2 arterial: 32 mmHg (ref 32.0–48.0)
pH, Arterial: 7.5 — ABNORMAL HIGH (ref 7.350–7.450)
pO2, Arterial: 54 mmHg — ABNORMAL LOW (ref 83.0–108.0)

## 2019-11-27 LAB — MRSA PCR SCREENING: MRSA by PCR: NEGATIVE

## 2019-11-27 LAB — GLUCOSE, CAPILLARY: Glucose-Capillary: 150 mg/dL — ABNORMAL HIGH (ref 70–99)

## 2019-11-27 LAB — T4, FREE: Free T4: 1.32 ng/dL — ABNORMAL HIGH (ref 0.61–1.12)

## 2019-11-27 LAB — TSH: TSH: 0.541 u[IU]/mL (ref 0.350–4.500)

## 2019-11-27 MED ORDER — CHLORHEXIDINE GLUCONATE CLOTH 2 % EX PADS
6.0000 | MEDICATED_PAD | Freq: Every day | CUTANEOUS | Status: DC
  Administered 2019-11-27 – 2019-12-20 (×19): 6 via TOPICAL

## 2019-11-27 MED ORDER — FUROSEMIDE 10 MG/ML IJ SOLN
40.0000 mg | Freq: Once | INTRAMUSCULAR | Status: AC
  Administered 2019-11-27: 40 mg via INTRAVENOUS
  Filled 2019-11-27: qty 4

## 2019-11-27 NOTE — Consult Note (Signed)
Reason for Consult: Increasing FiO2 requirements in the setting of COVID-19 pneumonia. Referring Physician: Arnetha Courser, MD  Rick Lowery is an 59 y.o. male.  HPI: Patient is a 59 year old lifelong never smoker admitted on 19 November 2019 for evaluation of an incarcerated ventral hernia.  In the process of evaluating the patient it was noted that he had a low-grade temperature.  SARS coronavirus 2 PCR test was positive at that time.  Chest x-ray revealed bilateral opacities consistent with COVID-19 pneumonia.  During evaluation of the emergency room the patient was also noted to be hypoxic with O2 saturations of 82% and was placed on nonrebreather mask.  Patient was admitted for management of his incarcerated ventral hernia which was reduced by the emergency room physician.  The patient was subsequently admitted for management of his COVID-19 symptoms.  Since admission he has had increased requirement of oxygen and today required high flow O2 as well as 100% nonrebreather mask.  Currently the patient states that he actually feels better.  He has not tachypneic.  He is in no distress.  He does not endorse cough or sputum production.  Upon transfer to the stepdown unit we were asked to assist with the patient's management.  The patient actually states that he does not understand "what is the fuss about".  Past Medical History:  Diagnosis Date  . Chronic neck and back pain     Past Surgical History:  Procedure Laterality Date  . CHOLECYSTECTOMY      Family History  Problem Relation Age of Onset  . Stroke Father   . Diabetes Mother   . Heart disease Mother    Social History   Tobacco Use  . Smoking status: Never Smoker  . Smokeless tobacco: Never Used  Substance Use Topics  . Alcohol use: No     Allergies: No Known Allergies  Scheduled Meds: . albuterol  2 puff Inhalation Q6H  . apixaban  5 mg Oral BID  . vitamin C  500 mg Oral Daily  . baricitinib  4 mg Oral Daily  .  Chlorhexidine Gluconate Cloth  6 each Topical Daily  . cholecalciferol  1,000 Units Oral Daily  . dextromethorphan-guaiFENesin  1 tablet Oral BID  . fluticasone  1 spray Each Nare Daily  . methylPREDNISolone (SOLU-MEDROL) injection  80 mg Intravenous Q12H  . sodium chloride flush  3 mL Intravenous Q12H  . traZODone  100 mg Oral QHS  . zinc sulfate  220 mg Oral Daily   Continuous Infusions: . sodium chloride Stopped (11/23/19 1030)   PRN Meds:.sodium chloride, acetaminophen, guaiFENesin-dextromethorphan, ondansetron **OR** ondansetron (ZOFRAN) IV, phenylephrine, sodium chloride, sodium chloride flush    Results for orders placed or performed during the hospital encounter of 12/15/2019 (from the past 48 hour(s))  Comprehensive metabolic panel     Status: Abnormal   Collection Time: 11/26/19  6:11 AM  Result Value Ref Range   Sodium 130 (L) 135 - 145 mmol/L   Potassium 4.6 3.5 - 5.1 mmol/L   Chloride 94 (L) 98 - 111 mmol/L   CO2 26 22 - 32 mmol/L   Glucose, Bld 140 (H) 70 - 99 mg/dL    Comment: Glucose reference range applies only to samples taken after fasting for at least 8 hours.   BUN 22 8 - 23 mg/dL   Creatinine, Ser 2.77 0.61 - 1.24 mg/dL   Calcium 8.5 (L) 8.9 - 10.3 mg/dL   Total Protein 6.6 6.5 - 8.1 g/dL   Albumin 2.7 (  L) 3.5 - 5.0 g/dL   AST 29 15 - 41 U/L   ALT 42 0 - 44 U/L   Alkaline Phosphatase 109 38 - 126 U/L   Total Bilirubin 1.3 (H) 0.3 - 1.2 mg/dL   GFR calc non Af Amer >60 >60 mL/min   GFR calc Af Amer >60 >60 mL/min   Anion gap 10 5 - 15    Comment: Performed at [facility], [address] [place], [place] [address]  CBC     Status: Abnormal   Collection Time: 11/26/19  6:11 AM  Result Value Ref Range   WBC 12.4 (H) 4.0 - 10.5 K/uL   RBC 5.79 4.22 - 5.81 MIL/uL   Hemoglobin 16.7 13.0 - 17.0 g/dL   HCT 35.4 39 - 52 %   MCV 81.9 80.0 - 100.0 fL   MCH 28.8 26.0 - 34.0 pg   MCHC 35.2 30.0 - 36.0 g/dL   RDW 56.2 56.3 - 89.3 %   Platelets 295  150 - 400 K/uL   nRBC 0.0 0.0 - 0.2 %    Comment: Performed at [facility], [address] [place], [place] [address]  CBC     Status: Abnormal   Collection Time: 11/27/19  4:47 AM  Result Value Ref Range   WBC 12.3 (H) 4.0 - 10.5 K/uL   RBC 5.27 4.22 - 5.81 MIL/uL   Hemoglobin 15.9 13.0 - 17.0 g/dL   HCT 76.8 39 - 52 %   MCV 83.1 80.0 - 100.0 fL   MCH 30.2 26.0 - 34.0 pg   MCHC 36.3 (H) 30.0 - 36.0 g/dL   RDW 11.5 72.6 - 20.3 %   Platelets 257 150 - 400 K/uL   nRBC 0.0 0.0 - 0.2 %    Comment: Performed at [facility], [address] [place], [place] [address]  Comprehensive metabolic panel     Status: Abnormal   Collection Time: 11/27/19  4:47 AM  Result Value Ref Range   Sodium 131 (L) 135 - 145 mmol/L   Potassium 4.6 3.5 - 5.1 mmol/L   Chloride 97 (L) 98 - 111 mmol/L   CO2 24 22 - 32 mmol/L   Glucose, Bld 132 (H) 70 - 99 mg/dL    Comment: Glucose reference range applies only to samples taken after fasting for at least 8 hours.   BUN 23 8 - 23 mg/dL   Creatinine, Ser 1.63 0.61 - 1.24 mg/dL   Calcium 8.1 (L) 8.9 - 10.3 mg/dL   Total Protein 6.0 (L) 6.5 - 8.1 g/dL   Albumin 2.4 (L) 3.5 - 5.0 g/dL   AST 27 15 - 41 U/L   ALT 36 0 - 44 U/L   Alkaline Phosphatase 85 38 - 126 U/L   Total Bilirubin 1.3 (H) 0.3 - 1.2 mg/dL   GFR calc non Af Amer >60 >60 mL/min   GFR calc Af Amer >60 >60 mL/min   Anion gap 10 5 - 15    Comment: Performed at [facility], [address] [place], [place] [address]  Blood gas, arterial     Status: Abnormal   Collection Time: 11/27/19 10:00 AM  Result Value Ref Range   FIO2 100.00    Delivery systems HI FLOW NASAL CANNULA    pH, Arterial 7.50 (H) 7.35 - 7.45   pCO2 arterial 32 32 - 48 mmHg   pO2, Arterial 54 (L) 83 - 108 mmHg   Bicarbonate 25.0 20.0 -  28.0 mmol/L   Acid-Base Excess 2.5 (H) 0.0 - 2.0 mmol/L   O2 Saturation 90.6 %   Patient temperature 37.0    Collection site RIGHT RADIAL    Sample type  ARTERIAL DRAW    Allens test (pass/fail) PASS PASS    Comment: Performed at [facility], [address] [place], [place] [address]  Glucose, capillary     Status: Abnormal   Collection Time: 11/27/19 12:14 PM  Result Value Ref Range   Glucose-Capillary 150 (H) 70 - 99 mg/dL    Comment: Glucose reference range applies only to samples taken after fasting for at least 8 hours.  MRSA PCR Screening     Status: None   Collection Time: 11/27/19 12:31 PM   Specimen: Nasal Mucosa; Nasopharyngeal  Result Value Ref Range   MRSA by PCR NEGATIVE NEGATIVE    Comment:        The GeneXpert MRSA Assay (FDA approved for NASAL specimens only), is one component of a comprehensive MRSA colonization surveillance program. It is not intended to diagnose MRSA infection nor to guide or monitor treatment for MRSA infections. Performed at [facility], [address] [place], [place] [address]     DG Chest Port 1 View  Result Date: 11/27/2019 CLINICAL DATA:  Hypoxia.  COVID-19 positive EXAM: PORTABLE CHEST 1 VIEW COMPARISON:  November 25, 2019 FINDINGS: Foci of airspace opacity scattered throughout the left lung as well as in the medial right base persist. New slight increased opacity in the right mid lung. Heart size and pulmonary vascular normal. No adenopathy. There is aortic atherosclerosis. No bone lesions. IMPRESSION: Multifocal airspace opacity consistent with atypical organism pneumonia. Similar changes on the left and right base regions compared to 2 days prior. Subtle new opacity right mid lung noted. Stable cardiac silhouette. Aortic Atherosclerosis (ICD10-I70.0). Electronically Signed   By: Bretta BangWilliam  Woodruff III M.D.   On: 11/27/2019 09:50    Review of Systems  A 10 point review of systems was performed and it is as noted above otherwise negative.  Blood pressure 126/69, pulse 79, temperature (!) 97.5 F (36.4 C), temperature source Axillary, resp. rate 20, height 5\' 11"   (1.803 m), weight 88.4 kg, SpO2 92 %.  SpO2: 92 % O2 Flow Rate (L/min): 50 L/min FiO2 (%): 100 %  Physical Exam physical examination is limited due to need for PPE/CAPR GENERAL: Well-developed well-nourished gentleman in no respiratory distress comfortable on high flow O2 and nonrebreather with mask. HEAD: Normocephalic, atraumatic.  EYES: Pupils equal, round, reactive to light.  No scleral icterus.  MOUTH: Oral mucosa moist NECK: Supple. No thyromegaly. Trachea midline. No JVD.  No adenopathy. PULMONARY: Good air entry bilaterally.  Coarse breath sounds, difficult to appreciate any other adventitious sounds due to PPE CARDIOVASCULAR:Regular rate and rhythm noted on monitor.  ABDOMEN: Soft, nondistended, normoactive bowel sounds.  No hepatosplenomegaly.  Ventral hernia, currently reducible. MUSCULOSKELETAL: No joint deformity, no clubbing, no edema.  NEUROLOGIC: No focal deficits.  Speech is fluent.   SKIN: Intact,warm,dry.  No rashes. PSYCH: Mood and behavior appear normal.   Assessment/Plan:  Acute respiratory failure with hypoxia due to COVID-19 Continue supplemental oxygen to keep saturations between 86 to 90% Titrate FiO2 down as tolerated Prone positioning as tolerates Use of incentive spirometry while awake Pulmonary toilet Patient does not desire intubation  COVID-19 pneumonia On remdesivir, baricitinib and methylprednisolone Supportive care  Transaminitis Likely due to COVID-19 Improving  Elevated fibrin derivatives Empirically placed on Eliquis Continue for now  as these will help to prevent microthrombi  Physical deconditioning Weakness Check thyroid function Per PT the patient will need SNF   Thank you for allowing [place] to participate in this patient's care.   Gailen Shelter, MD [facility] 11/27/2019, 6:21 PM    *This note was dictated using voice recognition software/Dragon.  Despite best efforts to proofread, errors can occur which can change the  meaning.  Any change was purely unintentional.

## 2019-11-27 NOTE — Progress Notes (Signed)
Spoke to wife to give her update.   Let her know patient was moved to ICU15.   Patient and wife do not want Intubation.  Messaged MD and let ICU nurse know patient's wishes.

## 2019-11-27 NOTE — Progress Notes (Signed)
PROGRESS NOTE    Rick Lowery  [name] DOB: August 21, 1960 DOA: Dec 10, 2019 PCP: Patient, No Pcp Per   Brief Narrative: Taken from H&P Rick Lowery is a 59 y.o. male with medical history significant for chronic neck and back pain who presents to the emergency room for evaluation of abdominal pain mostly around the periumbilical area. Found to be hypoxic and positive for COVID-19.  Patient is unvaccinated and denies any sick contacts.  Chest x-ray with bilateral infiltrates consistent with COVID-19 pneumonia.  Completed a course of remdesivir.  Continue to require higher level of oxygen.  Subjective: Patient is feeling little better when seen today in ICU.  He was transferred to stepdown unit as he was desaturating with heated HFNC 100% FiO2.  He was able to speak in full sentences with HFNC and nonrebreather. Discussed with him regarding getting intubation if needed, patient does not want to be intubated but wants full scope of medical care.  Assessment & Plan:   Principal Problem:   Pneumonia due to COVID-19 virus Active Problems:   Acute respiratory failure due to COVID-19 [facility])   Incarcerated ventral hernia   Transaminitis   Hyponatremia   Elevated LFTs   Insomnia   Impaired fasting glucose   Weakness  Acute hypoxic respiratory failure secondary to COVID-19 infection.  Patient continued to required high level of oxygen.  Currently saturating in mid 90s on 15 L plus non rebreather.  Completed a course of remdesivir.  Also received  ivermectin.  Repeated chest x-ray today with stable atypical pneumonia and a subtle new opacity in right midlung noted.  Repeat ABG with persistent hypoxia, CO2 of 32 and pH of 7.5. -Continue baricitinib-day 7 -Continue Solu-Medrol. -Aggressive pulmonary toileting. -Chest PT. -Continue supportive care and supplements. -Encourage proning. -Patient was started on Eliquis empirically, we can continue while in the hospital.  CTA negative for  PE. -Consult pulmonary due to worsening hypoxia. -Consult palliative as patient does not want to be intubated and currently high risk for deterioration and death.  Transaminitis.  Most likely secondary to COVID-19 infection.  Liver enzymes improving. -Continue to monitor.  Physical deconditioning.  PT is recommending SNF placement.  Patient will be a difficult for disposition as he is uninsured.  Currently homeless. -Continue with PT while in the hospital. -TOC to look for disposition once some improvement in his oxygen requirement.  Currently requiring a lot of oxygen which is not safe for discharge.  Objective: Vitals:   11/27/19 0850 11/27/19 1000 11/27/19 1237 11/27/19 1244  BP: 109/68  138/80   Pulse: 75  87   Resp: (!) 28  (!) 24   Temp: 98.3 F (36.8 C)  (!) 97 F (36.1 C)   TempSrc: Oral  Axillary   SpO2: (!) 89% 90% (!) 88% 92%  Weight:   88.4 kg   Height:   5\' 11"  (1.803 m)     Intake/Output Summary (Last 24 hours) at 11/27/2019 1259 Last data filed at 11/27/2019 1237 Gross per 24 hour  Intake 648 ml  Output 1850 ml  Net -1202 ml   Filed Weights   12-10-2019 0747 11/27/19 0443 11/27/19 1237  Weight: 104.3 kg 88.4 kg 88.4 kg    Examination:  General.  Well-developed gentleman, in no acute distress. Pulmonary.  Lungs clear bilaterally, normal respiratory effort. CV.  Regular rate and rhythm, no JVD, rub or murmur. Abdomen.  Soft, nontender, nondistended, BS positive. CNS.  Alert and oriented x3.  No focal neurologic deficit. Extremities.  No edema, no cyanosis, pulses intact and symmetrical. Psychiatry.  Judgment and insight appears normal.  DVT prophylaxis: Eliquis Code Status: Full Family Communication: Dr. Sarina SerVipul will be updating his wife.  I called her yesterday and told her that her husband is deteriorating and have to move him to a higher level of care.  Our communication ended up nicely with a request from his wife to keep her updated.  On my day 1 she told  me that they did not get vaccine as their son died of some congenital heart condition and they think that they might be carrier and should not be getting a vaccine.  I told her that I am not aware of any such contraindication but she needs to discuss with their primary care provider.  She also wanted me to start him on some unknown supplement which I refused as there was no evidence and they are not included in the protocol.  She wrote a big email accusing that I am discriminating due to his vaccination status and responsible for his husband's deterioration.  Disposition Plan:  Status is: Inpatient  Remains inpatient appropriate because:Inpatient level of care appropriate due to severity of illness   Dispo: The patient is from: Home              Anticipated d/c is to: Home              Anticipated d/c date is: 3 days              Patient currently is not medically stable to d/c.  Patient is currently on heated HFNC, 50 L 100% FiO2.  He is high risk for deterioration and death.  CODE STATUS changed to partial with no intubation according to his wishes.  Consultants:   Pulmonary  Palliative care  Procedures:  Antimicrobials:   Data Reviewed: I have personally reviewed following labs and imaging studies  CBC: Recent Labs  Lab 11/21/19 0726 11/21/19 0726 11/22/19 0458 11/23/19 0600 11/24/19 0545 11/26/19 0611 11/27/19 0447  WBC 8.1   < > 7.9 7.7 9.7 12.4* 12.3*  NEUTROABS 6.9  --  7.0 6.9 8.5*  --   --   HGB 15.6   < > 14.8 15.4 16.3 16.7 15.9  HCT 45.5   < > 42.4 43.2 46.3 47.4 43.8  MCV 84.6   < > 84.5 83.4 83.9 81.9 83.1  PLT 351   < > 299 300 306 295 257   < > = values in this interval not displayed.   Basic Metabolic Panel: Recent Labs  Lab 11/21/19 0726 11/21/19 0726 11/22/19 0458 11/23/19 0600 11/24/19 0545 11/26/19 0611 11/27/19 0447  NA 136   < > 135 136 132* 130* 131*  K 3.7   < > 4.0 4.2 4.8 4.6 4.6  CL 101   < > 103 102 98 94* 97*  CO2 25   < > 24 26 25  26 24   GLUCOSE 145*   < > 145* 133* 136* 140* 132*  BUN 16   < > 16 17 19 22 23   CREATININE 0.89   < > 0.63 0.81 0.90 0.86 0.80  CALCIUM 8.3*   < > 8.2* 8.0* 8.5* 8.5* 8.1*  MG 2.6*  --  2.2 2.3 2.4  --   --   PHOS 4.3  --  3.7 4.2 4.1  --   --    < > = values in this interval not displayed.   GFR: Estimated Creatinine  Clearance: 103.3 mL/min (by C-G formula based on SCr of 0.8 mg/dL). Liver Function Tests: Recent Labs  Lab 11/22/19 0458 11/23/19 0600 11/24/19 0545 11/26/19 0611 11/27/19 0447  AST 38 31 29 29 27   ALT 77* 59* 52* 42 36  ALKPHOS 136* 118 114 109 85  BILITOT 1.1 1.1 1.2 1.3* 1.3*  PROT 6.3* 6.2* 6.6 6.6 6.0*  ALBUMIN 2.7* 2.6* 2.8* 2.7* 2.4*   No results for input(s): LIPASE, AMYLASE in the last 168 hours. No results for input(s): AMMONIA in the last 168 hours. Coagulation Profile: No results for input(s): INR, PROTIME in the last 168 hours. Cardiac Enzymes: No results for input(s): CKTOTAL, CKMB, CKMBINDEX, TROPONINI in the last 168 hours. BNP (last 3 results) No results for input(s): PROBNP in the last 8760 hours. HbA1C: No results for input(s): HGBA1C in the last 72 hours. CBG: Recent Labs  Lab 11/27/19 1214  GLUCAP 150*   Lipid Profile: No results for input(s): CHOL, HDL, LDLCALC, TRIG, CHOLHDL, LDLDIRECT in the last 72 hours. Thyroid Function Tests: No results for input(s): TSH, T4TOTAL, FREET4, T3FREE, THYROIDAB in the last 72 hours. Anemia Panel: No results for input(s): VITAMINB12, FOLATE, FERRITIN, TIBC, IRON, RETICCTPCT in the last 72 hours. Sepsis Labs: Recent Labs  Lab 11/25/19 0945  PROCALCITON <0.10    Recent Results (from the past 240 hour(s))  SARS Coronavirus 2 by RT PCR (hospital order, performed in [facility] hospital lab) Nasopharyngeal Nasopharyngeal Swab     Status: Abnormal   Collection Time: 11-25-2019  8:21 AM   Specimen: Nasopharyngeal Swab  Result Value Ref Range Status   SARS Coronavirus 2 POSITIVE (A) NEGATIVE Final     Comment: RESULT CALLED TO, READ BACK BY AND VERIFIED WITH: MEGAN JONES AT 0931 ON 2019-11-25 [facility]. (NOTE) SARS-CoV-2 target nucleic acids are DETECTED  SARS-CoV-2 RNA is generally detectable in upper respiratory specimens  during the acute phase of infection.  Positive results are indicative  of the presence of the identified virus, but do not rule out bacterial infection or co-infection with other pathogens not detected by the test.  Clinical correlation with patient history and  other diagnostic information is necessary to determine patient infection status.  The expected result is negative.  Fact Sheet for Patients:   01/19/2020   Fact Sheet for Healthcare Providers:   BoilerBrush.com.cy    This test is not yet approved or cleared by the https://pope.com/ FDA and  has been authorized for detection and/or diagnosis of SARS-CoV-2 by FDA under an Emergency Use Authorization (EUA).  This EUA will remain in effect (meaning this  test can be used) for the duration of  the COVID-19 declaration under Section 564(b)(1) of the Act, 21 U.S.C. section 360-bbb-3(b)(1), unless the authorization is terminated or revoked sooner.  Performed at [facility], [address], [place], [place]      Radiology Studies: [facility] Chest [place] 1 View  Result Date: 11/27/2019 CLINICAL DATA:  Hypoxia.  COVID-19 positive EXAM: PORTABLE CHEST 1 VIEW COMPARISON:  November 25, 2019 FINDINGS: Foci of airspace opacity scattered throughout the left lung as well as in the medial right base persist. New slight increased opacity in the right mid lung. Heart size and pulmonary vascular normal. No adenopathy. There is aortic atherosclerosis. No bone lesions. IMPRESSION: Multifocal airspace opacity consistent with atypical organism pneumonia. Similar changes on the left and right base regions compared to 2 days prior. Subtle new opacity right mid lung  noted. Stable cardiac silhouette. Aortic Atherosclerosis (  ICD10-I70.0). Electronically Signed   By: Bretta Bang III M.D.   On: 11/27/2019 09:50    Scheduled Meds: . albuterol  2 puff Inhalation Q6H  . apixaban  5 mg Oral BID  . vitamin C  500 mg Oral Daily  . baricitinib  4 mg Oral Daily  . Chlorhexidine Gluconate Cloth  6 each Topical Daily  . cholecalciferol  1,000 Units Oral Daily  . dextromethorphan-guaiFENesin  1 tablet Oral BID  . fluticasone  1 spray Each Nare Daily  . methylPREDNISolone (SOLU-MEDROL) injection  80 mg Intravenous Q12H  . sodium chloride flush  3 mL Intravenous Q12H  . traZODone  100 mg Oral QHS  . zinc sulfate  220 mg Oral Daily   Continuous Infusions: . sodium chloride Stopped (11/23/19 1030)     LOS: 8 days   Time spent: 40 minutes.  Arnetha Courser, MD [facility]  If 7PM-7AM, please contact night-coverage [email or web address]  11/27/2019, 12:59 PM   This record has been created using Conservation officer, historic buildings. Errors have been sought and corrected,but may not always be located. Such creation errors do not reflect on the standard of care.

## 2019-11-27 NOTE — Progress Notes (Addendum)
Physical Therapy Re-Evaluation Patient Details Name: Rick Lowery MRN: [id number] DOB: 1960-08-20 Today's Date: 11/27/2019    History of Present Illness Per MD note:Zubayr Jessey Stehlin is a 59 y.o. male with medical history significant for chronic neck and back pain who presents to the emergency room for evaluation of abdominal pain mostly around the periumbilical area.  He rated his abdominal pain a 10 x 10 in intensity at its worst.  It was sudden in onset and associated with nausea but he denied having any vomiting or any changes in his bowel habits.  There was no radiation of the pain.  He denies having any urinary symptoms, no chest pain, no shortness of breath, no fever, no dizziness, no lightheadedness. Pt transferred to PCU for heated HFNC on 50L.    PT Comments    Patient alert, agreeable to PT, eager to see what he can do today. Dynamap acquired to check oxygen saturations, 90% on 50L at 100% fiO2 at start of session. Session focused on therapeutic exercises due to pt desaturation, further mobility deferred. Pt instructed and performed heel slides, hip abduction, SLR, ankle pumps, 10-15 reps ea side. Pt reported SOB with some exercises, desaturated to low 80s and needed 2-33minutes to recover to high 80s after ea exercise. Pt in bed all needs in reach. The patient would benefit from further skilled PT intervention to continue to progress towards goals. Recommendation remains appropriate.    Follow Up Recommendations  SNF     Equipment Recommendations  Rolling walker with 5" wheels    Recommendations for Other Services       Precautions / Restrictions Precautions Precautions: Fall Restrictions Weight Bearing Restrictions: No    Mobility  Bed Mobility               General bed mobility comments: deferred due desaturations  Transfers                    Ambulation/Gait                 Stairs             Wheelchair Mobility    Modified  Rankin (Stroke Patients Only)       Balance                                            Cognition Arousal/Alertness: Awake/alert Behavior During Therapy: WFL for tasks assessed/performed Overall Cognitive Status: Within Functional Limits for tasks assessed                                        Exercises Other Exercises Other Exercises: heel slides, hip abduction, SLR, ankle pumps, 10-15 reps ea side. Pt reported SOB with some exercises, desaturated to low 80s and needed 2-73minutes to recover to high 80s after ea exercise.    General Comments        Pertinent Vitals/Pain Pain Assessment: No/denies pain    Home Living                      Prior Function            PT Goals (current goals can now be found in the care plan section) Progress towards PT goals: Progressing toward goals  Frequency    Min 2X/week      PT Plan Current plan remains appropriate    Co-evaluation              AM-PAC PT "6 Clicks" Mobility   Outcome Measure  Help needed turning from your back to your side while in a flat bed without using bedrails?: None Help needed moving from lying on your back to sitting on the side of a flat bed without using bedrails?: None Help needed moving to and from a bed to a chair (including a wheelchair)?: A Lot Help needed standing up from a chair using your arms (e.g., wheelchair or bedside chair)?: A Lot Help needed to walk in hospital room?: A Lot Help needed climbing 3-5 steps with a railing? : A Lot 6 Click Score: 16    End of Session Equipment Utilized During Treatment: Gait belt;Oxygen (50L) Activity Tolerance: Treatment limited secondary to medical complications (Comment) Patient left: in bed;with call bell/phone within reach;with bed alarm set Nurse Communication: Mobility status PT Visit Diagnosis: Muscle weakness (generalized) (M62.81)     Time: 3005-1102 PT Time Calculation (min) (ACUTE  ONLY): 36 min  Charges:  $Therapeutic Exercise: 23-37 mins                     Olga Coaster PT, DPT 12:16 PM,11/27/19

## 2019-11-27 NOTE — Plan of Care (Signed)
  Problem: Clinical Measurements: Goal: Respiratory complications will improve Outcome: Progressing   

## 2019-11-28 LAB — COMPREHENSIVE METABOLIC PANEL
ALT: 36 U/L (ref 0–44)
AST: 23 U/L (ref 15–41)
Albumin: 2.7 g/dL — ABNORMAL LOW (ref 3.5–5.0)
Alkaline Phosphatase: 88 U/L (ref 38–126)
Anion gap: 12 (ref 5–15)
BUN: 36 mg/dL — ABNORMAL HIGH (ref 8–23)
CO2: 26 mmol/L (ref 22–32)
Calcium: 8.5 mg/dL — ABNORMAL LOW (ref 8.9–10.3)
Chloride: 94 mmol/L — ABNORMAL LOW (ref 98–111)
Creatinine, Ser: 0.96 mg/dL (ref 0.61–1.24)
GFR calc Af Amer: >60 mL/min (ref >60)
GFR calc non Af Amer: >60 mL/min (ref >60)
Glucose, Bld: 132 mg/dL — ABNORMAL HIGH (ref 70–99)
Potassium: 4.4 mmol/L (ref 3.5–5.1)
Sodium: 132 mmol/L — ABNORMAL LOW (ref 135–145)
Total Bilirubin: 1.4 mg/dL — ABNORMAL HIGH (ref 0.3–1.2)
Total Protein: 6.5 g/dL (ref 6.5–8.1)

## 2019-11-28 LAB — CBC
HCT: 48.3 % (ref 39.0–52.0)
Hemoglobin: 16.6 g/dL (ref 13.0–17.0)
MCH: 29.1 pg (ref 26.0–34.0)
MCHC: 34.4 g/dL (ref 30.0–36.0)
MCV: 84.7 fL (ref 80.0–100.0)
Platelets: 272 10*3/uL (ref 150–400)
RBC: 5.7 MIL/uL (ref 4.22–5.81)
RDW: 14.7 % (ref 11.5–15.5)
WBC: 12 10*3/uL — ABNORMAL HIGH (ref 4.0–10.5)
nRBC: 0 % (ref 0.0–0.2)

## 2019-11-28 LAB — PROCALCITONIN: Procalcitonin: <0.1 ng/mL

## 2019-11-28 LAB — PHOSPHORUS: Phosphorus: 4.8 mg/dL — ABNORMAL HIGH (ref 2.5–4.6)

## 2019-11-28 LAB — MAGNESIUM: Magnesium: 2.7 mg/dL — ABNORMAL HIGH (ref 1.7–2.4)

## 2019-11-28 MED ORDER — ENOXAPARIN SODIUM 40 MG/0.4ML ~~LOC~~ SOLN
40.0000 mg | SUBCUTANEOUS | Status: DC
  Administered 2019-11-28 – 2019-12-04 (×7): 40 mg via SUBCUTANEOUS
  Filled 2019-11-28 (×7): qty 0.4

## 2019-11-28 MED ORDER — FAMOTIDINE 20 MG PO TABS
20.0000 mg | ORAL_TABLET | Freq: Two times a day (BID) | ORAL | Status: DC
  Administered 2019-11-28 – 2019-12-02 (×8): 20 mg via ORAL
  Filled 2019-11-28 (×10): qty 1

## 2019-11-28 MED ORDER — INDOMETHACIN 50 MG PO CAPS
50.0000 mg | ORAL_CAPSULE | Freq: Three times a day (TID) | ORAL | Status: DC
  Administered 2019-11-29 – 2019-12-01 (×7): 50 mg via ORAL
  Filled 2019-11-28 (×12): qty 1

## 2019-11-28 NOTE — Progress Notes (Signed)
PROGRESS NOTE    Rick Lowery  UXL:[id number] DOB: 06-29-1960 DOA: 12/01/2019 PCP: Patient, No Pcp Per   Brief Narrative: Taken from H&P Rick Lowery is a 59 y.o. male with medical history significant for chronic neck and back pain who presents to the emergency room for evaluation of abdominal pain mostly around the periumbilical area. Found to be hypoxic and positive for COVID-19.  Patient is unvaccinated and denies any sick contacts.  Chest x-ray with bilateral infiltrates consistent with COVID-19 pneumonia.  Completed a course of remdesivir.  Continue to require higher level of oxygen.  Subjective: Patient has no new complaint.  He was lying on his side with a nonrebreather and heated HFNC and barely saturating in mid 80s.  Denies any shortness of breath and able to speak in full sentences but appears lethargic.  Assessment & Plan:   Principal Problem:   Pneumonia due to COVID-19 virus Active Problems:   Acute respiratory failure due to COVID-19 [facility])   Incarcerated ventral hernia   Transaminitis   Hyponatremia   Elevated LFTs   Insomnia   Impaired fasting glucose   Weakness  Acute hypoxic respiratory failure secondary to COVID-19 infection.  Patient continued to required high level of oxygen.  Patient was on maximum setting of heated HFNC along with nonrebreather and barely making in mid 80s.  Appears lethargic but able to speak full sentences.  Completed a course of remdesivir.  Also received  ivermectin.  Repeated chest x-ray  with stable atypical pneumonia and a subtle new opacity in right midlung noted.   -Continue baricitinib-day 8 -Continue Solu-Medrol. -Aggressive pulmonary toileting. -Chest PT. -Continue supportive care and supplements. -Encourage proning. -Patient was started on Eliquis empirically, we can continue while in the hospital.  CTA negative for PE. -Pulmonary was consulted-appreciate their help. -Consult palliative as patient does not want to  be intubated and currently high risk for deterioration and death.  Transaminitis.  Most likely secondary to COVID-19 infection.  Liver enzymes improving. -Continue to monitor.  Physical deconditioning.  PT is recommending SNF placement.  Patient will be a difficult for disposition as he is uninsured.  Currently homeless. -Continue with PT while in the hospital. -TOC to look for disposition once some improvement in his oxygen requirement.  Currently requiring a lot of oxygen which is not safe for discharge.  Objective: Vitals:   11/28/19 0300 11/28/19 0700 11/28/19 0800 11/28/19 0828  BP: 93/64 (!) 103/54 113/66   Pulse: 62 75 87   Resp: (!) 23 (!) 28 20   Temp:   97.6 F (36.4 C)   TempSrc:   Axillary   SpO2: 90% 95% (!) 82% (!) 88%  Weight:      Height:        Intake/Output Summary (Last 24 hours) at 11/28/2019 0909 Last data filed at 11/28/2019 0800 Gross per 24 hour  Intake 0 ml  Output 1700 ml  Net -1700 ml   Filed Weights   Dec 01, 2019 0747 11/27/19 0443 11/27/19 1237  Weight: 104.3 kg 88.4 kg 88.4 kg    Examination:  General.  Ill-appearing gentleman, in no acute distress. Pulmonary.  Lungs clear bilaterally, normal respiratory effort. CV.  Regular rate and rhythm, no JVD, rub or murmur. Abdomen.  Soft, nontender, nondistended, BS positive. CNS.  Alert and oriented x3.  No focal neurologic deficit. Extremities.  No edema, no cyanosis, pulses intact and symmetrical. Psychiatry.  Judgment and insight appears normal.  DVT prophylaxis: Eliquis Code Status: Full Family Communication:  Nursing staff will communicate with his wife. Disposition Plan:  Status is: Inpatient  Remains inpatient appropriate because:Inpatient level of care appropriate due to severity of illness   Dispo: The patient is from: Home              Anticipated d/c is to: Home              Anticipated d/c date is: 3 days              Patient currently is not medically stable to d/c.  Patient is  currently on heated HFNC, 50 L 100% FiO2 along with nonrebreather.  He is high risk for deterioration and death.  CODE STATUS changed to partial with no intubation according to his wishes.  Consultants:   Pulmonary  Palliative care  Procedures:  Antimicrobials:   Data Reviewed: I have personally reviewed following labs and imaging studies  CBC: Recent Labs  Lab 11/22/19 0458 11/22/19 0458 11/23/19 0600 11/24/19 0545 11/26/19 0611 11/27/19 0447 11/28/19 0634  WBC 7.9   < > 7.7 9.7 12.4* 12.3* 12.0*  NEUTROABS 7.0  --  6.9 8.5*  --   --   --   HGB 14.8   < > 15.4 16.3 16.7 15.9 16.6  HCT 42.4   < > 43.2 46.3 47.4 43.8 48.3  MCV 84.5   < > 83.4 83.9 81.9 83.1 84.7  PLT 299   < > 300 306 295 257 272   < > = values in this interval not displayed.   Basic Metabolic Panel: Recent Labs  Lab 11/22/19 0458 11/22/19 0458 11/23/19 0600 11/24/19 0545 11/26/19 0611 11/27/19 0447 11/28/19 0634  NA 135   < > 136 132* 130* 131* 132*  K 4.0   < > 4.2 4.8 4.6 4.6 4.4  CL 103   < > 102 98 94* 97* 94*  CO2 24   < > 26 25 26 24 26   GLUCOSE 145*   < > 133* 136* 140* 132* 132*  BUN 16   < > 17 19 22 23  36*  CREATININE 0.63   < > 0.81 0.90 0.86 0.80 0.96  CALCIUM 8.2*   < > 8.0* 8.5* 8.5* 8.1* 8.5*  MG 2.2  --  2.3 2.4  --   --  2.7*  PHOS 3.7  --  4.2 4.1  --   --  4.8*   < > = values in this interval not displayed.   GFR: Estimated Creatinine Clearance: 86.1 mL/min (by C-G formula based on SCr of 0.96 mg/dL). Liver Function Tests: Recent Labs  Lab 11/23/19 0600 11/24/19 0545 11/26/19 0611 11/27/19 0447 11/28/19 0634  AST 31 29 29 27 23   ALT 59* 52* 42 36 36  ALKPHOS 118 114 109 85 88  BILITOT 1.1 1.2 1.3* 1.3* 1.4*  PROT 6.2* 6.6 6.6 6.0* 6.5  ALBUMIN 2.6* 2.8* 2.7* 2.4* 2.7*   No results for input(s): LIPASE, AMYLASE in the last 168 hours. No results for input(s): AMMONIA in the last 168 hours. Coagulation Profile: No results for input(s): INR, PROTIME in the last  168 hours. Cardiac Enzymes: No results for input(s): CKTOTAL, CKMB, CKMBINDEX, TROPONINI in the last 168 hours. BNP (last 3 results) No results for input(s): PROBNP in the last 8760 hours. HbA1C: No results for input(s): HGBA1C in the last 72 hours. CBG: Recent Labs  Lab 11/27/19 1214  GLUCAP 150*   Lipid Profile: No results for input(s): CHOL, HDL, LDLCALC, TRIG, CHOLHDL, LDLDIRECT  in the last 72 hours. Thyroid Function Tests: Recent Labs    11/27/19 0447  TSH 0.541  FREET4 1.32*   Anemia Panel: No results for input(s): VITAMINB12, FOLATE, FERRITIN, TIBC, IRON, RETICCTPCT in the last 72 hours. Sepsis Labs: Recent Labs  Lab 11/25/19 0945  PROCALCITON <0.10    Recent Results (from the past 240 hour(s))  SARS Coronavirus 2 by RT PCR (hospital order, performed in [facility] hospital lab) Nasopharyngeal Nasopharyngeal Swab     Status: Abnormal   Collection Time: 12/05/2019  8:21 AM   Specimen: Nasopharyngeal Swab  Result Value Ref Range Status   SARS Coronavirus 2 POSITIVE (A) NEGATIVE Final    Comment: RESULT CALLED TO, READ BACK BY AND VERIFIED WITH: MEGAN JONES AT 0931 ON 11/28/2019 [facility]. (NOTE) SARS-CoV-2 target nucleic acids are DETECTED  SARS-CoV-2 RNA is generally detectable in upper respiratory specimens  during the acute phase of infection.  Positive results are indicative  of the presence of the identified virus, but do not rule out bacterial infection or co-infection with other pathogens not detected by the test.  Clinical correlation with patient history and  other diagnostic information is necessary to determine patient infection status.  The expected result is negative.  Fact Sheet for Patients:   BoilerBrush.com.cy   Fact Sheet for Healthcare Providers:   https://pope.com/    This test is not yet approved or cleared by the [place] FDA and  has been authorized for detection and/or diagnosis of  SARS-CoV-2 by FDA under an Emergency Use Authorization (EUA).  This EUA will remain in effect (meaning this  test can be used) for the duration of  the COVID-19 declaration under Section 564(b)(1) of the Act, 21 U.S.C. section 360-bbb-3(b)(1), unless the authorization is terminated or revoked sooner.  Performed at [facility], [address] [place], [place] [address]   MRSA PCR Screening     Status: None   Collection Time: 11/27/19 12:31 PM   Specimen: Nasal Mucosa; Nasopharyngeal  Result Value Ref Range Status   MRSA by PCR NEGATIVE NEGATIVE Final    Comment:        The GeneXpert MRSA Assay (FDA approved for NASAL specimens only), is one component of a comprehensive MRSA colonization surveillance program. It is not intended to diagnose MRSA infection nor to guide or monitor treatment for MRSA infections. Performed at [facility], [address] [place], [place] [address]      Radiology Studies: [facility] Chest [place] 1 View  Result Date: 11/27/2019 CLINICAL DATA:  Hypoxia.  COVID-19 positive EXAM: PORTABLE CHEST 1 VIEW COMPARISON:  November 25, 2019 FINDINGS: Foci of airspace opacity scattered throughout the left lung as well as in the medial right base persist. New slight increased opacity in the right mid lung. Heart size and pulmonary vascular normal. No adenopathy. There is aortic atherosclerosis. No bone lesions. IMPRESSION: Multifocal airspace opacity consistent with atypical organism pneumonia. Similar changes on the left and right base regions compared to 2 days prior. Subtle new opacity right mid lung noted. Stable cardiac silhouette. Aortic Atherosclerosis (ICD10-I70.0). Electronically Signed   By: Bretta Bang III M.D.   On: 11/27/2019 09:50    Scheduled Meds: . albuterol  2 puff Inhalation Q6H  . apixaban  5 mg Oral BID  . vitamin C  500 mg Oral Daily  . baricitinib  4 mg Oral Daily  . Chlorhexidine Gluconate Cloth  6 each Topical Daily  .  cholecalciferol  1,000 Units Oral Daily  .  dextromethorphan-guaiFENesin  1 tablet Oral BID  . fluticasone  1 spray Each Nare Daily  . methylPREDNISolone (SOLU-MEDROL) injection  80 mg Intravenous Q12H  . sodium chloride flush  3 mL Intravenous Q12H  . traZODone  100 mg Oral QHS  . zinc sulfate  220 mg Oral Daily   Continuous Infusions: . sodium chloride Stopped (11/23/19 1030)     LOS: 9 days   Time spent: 35 minutes.  Arnetha CourserSumayya Charity Tessier, MD [facility]  If 7PM-7AM, please contact night-coverage [email or web address]  11/28/2019, 9:09 AM   This record has been created using Conservation officer, historic buildingsDragon voice recognition software. Errors have been sought and corrected,but may not always be located. Such creation errors do not reflect on the standard of care.

## 2019-11-28 NOTE — Plan of Care (Signed)

## 2019-11-28 NOTE — Progress Notes (Signed)
ANTICOAGULATION CONSULT NOTE - Initial Consult  Pharmacy Consult for transition from Apixaban to Enoxaparin for VTE prophylaxis Indication: VTE prophylaxis  No Known Allergies  Patient Measurements: Height: 5\' 11"  (180.3 cm) Weight: 88.4 kg (194 lb 14.2 oz) IBW/kg (Calculated) : 75.3 Heparin Dosing Weight:    Vital Signs: Temp: 97.4 F (36.3 C) (09/11 1700) Temp Source: Axillary (09/11 1700) BP: 128/76 (09/11 1710) Pulse Rate: 121 (09/11 1710)  Labs: Recent Labs    11/26/19 0611 11/26/19 0611 11/27/19 0447 11/28/19 0634  HGB 16.7   < > 15.9 16.6  HCT 47.4  --  43.8 48.3  PLT 295  --  257 272  CREATININE 0.86  --  0.80 0.96   < > = values in this interval not displayed.    Estimated Creatinine Clearance: 86.1 mL/min (by C-G formula based on SCr of 0.96 mg/dL).   Medical History: Past Medical History:  Diagnosis Date  . Chronic neck and back pain    Assessment: Patient is a 59yo male admitted with Covid pneumonia. Initially started on apixaban but discontinued by MD due to no indication for therapeutic treatment. Pharmacy consulted to dose Enoxaparin for VTE prophylaxis.   Plan:  Patient received last dose of Apixaban at ~10:00 this morning. Will order Enoxaparin 40mg  SQ q24h to begin at 22:00.  59yo, PharmD, BCPS 11/28/2019 5:58 PM

## 2019-11-28 NOTE — Progress Notes (Signed)
Follow up - Critical Care Medicine Note  Patient Details:    Rick Lowery is an 60 y.o. male lifelong never smoker presented to [facility] 19 November 2019 for issues with an incarcerated ventral hernia.  In the process of being evaluated the patient was noted to have SARS-CoV-2 pneumonia.  Patient was admitted for management of acute respiratory failure with hypoxia due to COVID-19 pneumonia.  Patient transferred to the ICU/stepdown on 27 November 2019 due to increasing FiO2 requirements.  Lines, Airways, Drains: Peripheral IVs only  Anti-infectives:  Anti-infectives (From admission, onward)   Start     Dose/Rate Route Frequency Ordered Stop   11/24/19 1530  ivermectin (STROMECTOL) tablet 15,000 mcg        150 mcg/kg  104.3 kg Oral  Once 11/24/19 1519 11/24/19 1620   11/23/19 1545  ivermectin (STROMECTOL) tablet 15,000 mcg        150 mcg/kg  104.3 kg Oral  Once 11/23/19 1510 11/23/19 1642   11/22/19 1515  ivermectin (STROMECTOL) tablet 15,000 mcg        150 mcg/kg  104.3 kg Oral  Once 11/22/19 1506 11/22/19 1744   11/21/19 1530  ivermectin (STROMECTOL) tablet 15,000 mcg        150 mcg/kg  104.3 kg Oral  Once 11/21/19 1512 11/21/19 1613   11/20/19 1000  remdesivir 100 mg in sodium chloride 0.9 % 100 mL IVPB  Status:  Discontinued       "Followed by" Linked Group Details   100 mg 200 mL/hr over 30 Minutes Intravenous Daily 12/06/2019 1003 11/29/2019 1029   11/20/19 1000  remdesivir 100 mg in sodium chloride 0.9 % 100 mL IVPB       "Followed by" Linked Group Details   100 mg 200 mL/hr over 30 Minutes Intravenous Daily 12/15/2019 1024 11/23/19 0950   12/09/2019 1200  remdesivir 200 mg in sodium chloride 0.9% 250 mL IVPB  Status:  Discontinued       "Followed by" Linked Group Details   200 mg 580 mL/hr over 30 Minutes Intravenous Once 12/04/2019 1003 12/10/2019 1029   12/01/2019 1200  remdesivir 200 mg in sodium chloride 0.9% 250 mL IVPB       "Followed by" Linked Group Details   200 mg 580  mL/hr over 30 Minutes Intravenous Once 11/27/2019 1024 11/22/2019 1918     Scheduled Meds: . albuterol  2 puff Inhalation Q6H  . vitamin C  500 mg Oral Daily  . Chlorhexidine Gluconate Cloth  6 each Topical Daily  . dextromethorphan-guaiFENesin  1 tablet Oral BID  . famotidine  20 mg Oral BID  . fluticasone  1 spray Each Nare Daily  . [START ON 11/29/2019] indomethacin  50 mg Oral TID WC  . methylPREDNISolone (SOLU-MEDROL) injection  80 mg Intravenous Q12H  . sodium chloride flush  3 mL Intravenous Q12H  . traZODone  100 mg Oral QHS  . zinc sulfate  220 mg Oral Daily   Continuous Infusions: . sodium chloride Stopped (11/23/19 1030)   PRN Meds:.sodium chloride, acetaminophen, guaiFENesin-dextromethorphan, ondansetron **OR** ondansetron (ZOFRAN) IV, phenylephrine, sodium chloride, sodium chloride flush   Microbiology: Results for orders placed or performed during the hospital encounter of 11/26/2019  SARS Coronavirus 2 by RT PCR (hospital order, performed in [facility] hospital lab) Nasopharyngeal Nasopharyngeal Swab     Status: Abnormal   Collection Time: 12/12/2019  8:21 AM   Specimen: Nasopharyngeal Swab  Result Value Ref Range Status   SARS Coronavirus 2 POSITIVE (A) NEGATIVE  Final    Comment: RESULT CALLED TO, READ BACK BY AND VERIFIED WITH: MEGAN JONES AT 0931 ON 12/08/2019 [facility]. (NOTE) SARS-CoV-2 target nucleic acids are DETECTED  SARS-CoV-2 RNA is generally detectable in upper respiratory specimens  during the acute phase of infection.  Positive results are indicative  of the presence of the identified virus, but do not rule out bacterial infection or co-infection with other pathogens not detected by the test.  Clinical correlation with patient history and  other diagnostic information is necessary to determine patient infection status.  The expected result is negative.  Fact Sheet for Patients:   BoilerBrush.com.cy   Fact Sheet for Healthcare Providers:    https://pope.com/    This test is not yet approved or cleared by the [place] FDA and  has been authorized for detection and/or diagnosis of SARS-CoV-2 by FDA under an Emergency Use Authorization (EUA).  This EUA will remain in effect (meaning this  test can be used) for the duration of  the COVID-19 declaration under Section 564(b)(1) of the Act, 21 U.S.C. section 360-bbb-3(b)(1), unless the authorization is terminated or revoked sooner.  Performed at [facility], [address] [place], [place] [address]   MRSA PCR Screening     Status: None   Collection Time: 11/27/19 12:31 PM   Specimen: Nasal Mucosa; Nasopharyngeal  Result Value Ref Range Status   MRSA by PCR NEGATIVE NEGATIVE Final    Comment:        The GeneXpert MRSA Assay (FDA approved for NASAL specimens only), is one component of a comprehensive MRSA colonization surveillance program. It is not intended to diagnose MRSA infection nor to guide or monitor treatment for MRSA infections. Performed at [facility], [address] [place], [place] [address]    Results for orders placed or performed during the hospital encounter of 11/29/2019 (from the past 24 hour(s))  CBC     Status: Abnormal   Collection Time: 11/28/19  6:34 AM  Result Value Ref Range   WBC 12.0 (H) 4.0 - 10.5 K/uL   RBC 5.70 4.22 - 5.81 MIL/uL   Hemoglobin 16.6 13.0 - 17.0 g/dL   HCT 75.1 39 - 52 %   MCV 84.7 80.0 - 100.0 fL   MCH 29.1 26.0 - 34.0 pg   MCHC 34.4 30.0 - 36.0 g/dL   RDW 02.5 85.2 - 77.8 %   Platelets 272 150 - 400 K/uL   nRBC 0.0 0.0 - 0.2 %  Comprehensive metabolic panel     Status: Abnormal   Collection Time: 11/28/19  6:34 AM  Result Value Ref Range   Sodium 132 (L) 135 - 145 mmol/L   Potassium 4.4 3.5 - 5.1 mmol/L   Chloride 94 (L) 98 - 111 mmol/L   CO2 26 22 - 32 mmol/L   Glucose, Bld 132 (H) 70 - 99 mg/dL   BUN 36 (H) 8 - 23 mg/dL   Creatinine, Ser 2.42 0.61 - 1.24  mg/dL   Calcium 8.5 (L) 8.9 - 10.3 mg/dL   Total Protein 6.5 6.5 - 8.1 g/dL   Albumin 2.7 (L) 3.5 - 5.0 g/dL   AST 23 15 - 41 U/L   ALT 36 0 - 44 U/L   Alkaline Phosphatase 88 38 - 126 U/L   Total Bilirubin 1.4 (H) 0.3 - 1.2 mg/dL   GFR calc non Af Amer >60 >60 mL/min   GFR calc Af Amer >60 >60 mL/min   Anion gap 12 5 -  15  Magnesium     Status: Abnormal   Collection Time: 11/28/19  6:34 AM  Result Value Ref Range   Magnesium 2.7 (H) 1.7 - 2.4 mg/dL  Phosphorus     Status: Abnormal   Collection Time: 11/28/19  6:34 AM  Result Value Ref Range   Phosphorus 4.8 (H) 2.5 - 4.6 mg/dL  Procalcitonin - Baseline     Status: None   Collection Time: 11/28/19  6:35 AM  Result Value Ref Range   Procalcitonin <0.10 ng/mL     Best Practice/Protocols:  VTE Prophylaxis: Direct Thrombin Inhibitor GI Prophylaxis: Antihistamine   Events: 09/02: Admitted with COVID-19 pneumonia 09/10: Transferred to ICU/stepdown due to increasing FiO2 requirements high flow nasal cannula O2 +100% nonrebreather.  Patient self proning, using incentive spirometer. 09/11: Persistent issues with FiO2 requirements, on high flow nasal cannula 50 L, 100% FiO2.  Studies: CT ANGIO CHEST PE W OR WO CONTRAST  Result Date: 11/22/2019 CLINICAL DATA:  Increasing shortness of breath in a patient with COVID-19 infection. EXAM: CT ANGIOGRAPHY CHEST WITH CONTRAST TECHNIQUE: Multidetector CT imaging of the chest was performed using the standard protocol during bolus administration of intravenous contrast. Multiplanar CT image reconstructions and MIPs were obtained to evaluate the vascular anatomy. CONTRAST:  100mL OMNIPAQUE IOHEXOL 350 MG/ML SOLN COMPARISON:  Chest radiograph November 19, 2019 FINDINGS: Cardiovascular: Satisfactory opacification of the pulmonary arteries to the segmental level. No evidence of pulmonary embolism. Mildly enlarged heart. No pericardial effusion. Mediastinum/Nodes: No enlarged mediastinal, hilar, or  axillary lymph nodes. Thyroid gland, trachea, and esophagus demonstrate no significant findings. Lungs/Pleura: Widespread ground-glass and confluent airspace consolidation throughout both lungs with lower lobe and dependent distribution, consistent with moderate to severe COVID-19 pneumonia. 7 mm solid soft tissue nodule in the right middle lobe, image 177/323. Upper Abdomen: No acute abnormality. Musculoskeletal: No chest wall abnormality. No acute or significant osseous findings. Review of the MIP images confirms the above findings. IMPRESSION: 1. No evidence of pulmonary embolus. 2. Widespread ground-glass and confluent airspace consolidation throughout both lungs with lower lobe and dependent distribution, consistent with moderate to severe COVID-19 pneumonia. 3. 7 mm solid soft tissue nodule in the right middle lobe. Non-contrast chest CT at 6-12 months is recommended. If the nodule is stable at time of repeat CT, then future CT at 18-24 months (from today's scan) is considered optional for low-risk patients, but is recommended for high-risk patients. This recommendation follows the consensus statement: Guidelines for Management of Incidental Pulmonary Nodules Detected on CT Images: From the Fleischner Society 2017; Radiology 2017; 284:228-243. 4. Mildly enlarged heart. Electronically Signed   By: Ted Mcalpineobrinka  Dimitrova M.D.   On: 11/22/2019 12:41   US Venous Img Lower Bilateral (DVT)  Result Date: 11/24/2019 CLINICAL DATA:  Shortness of breath. EXAM: BILATERAL LOWER EXTREMITY VENOUS DOPPLER ULTRASOUND TECHNIQUE: Gray-scale sonography with compression, as well as color and duplex ultrasound, were performed to evaluate the deep venous system(s) from the level of the common femoral vein through the popliteal and proximal calf veins. COMPARISON:  None. FINDINGS: VENOUS Normal compressibility of the common femoral, superficial femoral, and popliteal veins, as well as the visualized calf veins. Visualized portions  of profunda femoral vein and great saphenous vein unremarkable. No filling defects to suggest DVT on grayscale or color Doppler imaging. Doppler waveforms show normal direction of venous flow, normal respiratory plasticity and response to augmentation. OTHER None. Limitations: none IMPRESSION: Negative. Electronically Signed   By: Katherine Mantlehristopher  Green M.D.   On: 11/24/2019 15:35  DG Chest Port 1 View  Result Date: 11/27/2019 CLINICAL DATA:  Hypoxia.  COVID-19 positive EXAM: PORTABLE CHEST 1 VIEW COMPARISON:  November 25, 2019 FINDINGS: Foci of airspace opacity scattered throughout the left lung as well as in the medial right base persist. New slight increased opacity in the right mid lung. Heart size and pulmonary vascular normal. No adenopathy. There is aortic atherosclerosis. No bone lesions. IMPRESSION: Multifocal airspace opacity consistent with atypical organism pneumonia. Similar changes on the left and right base regions compared to 2 days prior. Subtle new opacity right mid lung noted. Stable cardiac silhouette. Aortic Atherosclerosis (ICD10-I70.0). Electronically Signed   By: Bretta Bang III M.D.   On: 11/27/2019 09:50   DG Chest Port 1 View  Result Date: 11/25/2019 CLINICAL DATA:  Shortness of breath EXAM: PORTABLE CHEST 1 VIEW COMPARISON:  CT from 3 days ago FINDINGS: Atypical pneumonia with asymmetric involvement on the left. Cardiomegaly. No visible effusion or pneumothorax. IMPRESSION: Atypical pneumonia without progression since study 6 days ago. Cardiomegaly Electronically Signed   By: Marnee Spring M.D.   On: 11/25/2019 06:55   DG Chest Portable 1 View  Result Date: Dec 07, 2019 CLINICAL DATA:  Hypoxia and fever EXAM: PORTABLE CHEST 1 VIEW COMPARISON:  None. FINDINGS: Patchy bilateral airspace disease. Low lung volumes. Prominent heart size, accentuated by low volume chest. No visible effusion or pneumothorax. IMPRESSION: Atypical pneumonia pattern with low lung volumes.  Electronically Signed   By: Marnee Spring M.D.   On: December 07, 2019 08:48    Consults: Treatment Team:  Salena Saner, MD   Subjective:    Overnight Issues: Overnight no issues.  Patient has no overt complaints.  Objective:  Vital signs for last 24 hours: Temp:  [97.4 F (36.3 C)-97.9 F (36.6 C)] 97.4 F (36.3 C) (09/11 1700) Pulse Rate:  [61-121] 121 (09/11 1710) Resp:  [16-32] 17 (09/11 1710) BP: (92-140)/(54-91) 128/76 (09/11 1710) SpO2:  [73 %-96 %] 73 % (09/11 1710) FiO2 (%):  [80 %-100 %] 80 % (09/11 1530)  Hemodynamic parameters for last 24 hours:    Intake/Output from previous day: 09/10 0701 - 09/11 0700 In: 240 [P.O.:240] Out: 2075 [Urine:2075]  Intake/Output this shift: Total I/O In: 1205 [P.O.:1200; I.V.:5] Out: 580 [Urine:580]  Vent settings for last 24 hours: FiO2 (%):  [80 %-100 %] 80 %  Physical Exam:  Physical Exam physical examination is limited due to need for PPE/CAPR GENERAL: Well-developed well-nourished gentleman in no respiratory distress comfortable on high flow O2 and nonrebreather with mask. HEAD: Normocephalic, atraumatic.  EYES: Pupils equal, round, reactive to light.  No scleral icterus.  MOUTH: Oral mucosa moist NECK: Supple. No thyromegaly. Trachea midline. No JVD.  No adenopathy. PULMONARY: Good air entry bilaterally.  Coarse breath sounds, difficult to appreciate any other adventitious sounds due to PPE CARDIOVASCULAR:Regular rate and rhythm noted on monitor.  ABDOMEN: Soft, nondistended, normoactive bowel sounds.  No hepatosplenomegaly.  Ventral hernia, currently reducible. MUSCULOSKELETAL: No joint deformity, no clubbing, no edema.  NEUROLOGIC: No focal deficits.  Speech is fluent.   SKIN: Intact,warm,dry.  No rashes. PSYCH: Mood and behavior appear normal  Assessment/Plan:  Acute respiratory failure with hypoxia due to COVID-19 Continue supplemental oxygen to keep saturations between 86 to 90% Titrate FiO2 down as  tolerated Continue prone positioning as tolerates Continue use of incentive spirometry while awake Continue pulmonary toilet Patient continues to decline intubation  COVID-19 pneumonia On remdesivir, baricitinib and methylprednisolone Added indomethacin to help with cough/intrapulmonary shunting Supportive care  Transaminitis  Likely due to COVID-19 Resolving  Elevated fibrin derivatives Empirically placed on Eliquis, no evidence of PE Switch to Lovenox for DVT prophylaxis  Physical deconditioning Weakness Thyroid function normal Per PT the patient will need SNF    LOS: 9 days   Additional comments: Updated patient's wife, Rick Lowery, via phone.  Care coordination performed with patient's nurse.    Gailen Shelter, MD [facility] 11/28/2019  *This note was dictated using voice recognition software/Dragon.  Despite best efforts to proofread, errors can occur which can change the meaning.  Any change was purely unintentional..

## 2019-11-28 NOTE — Progress Notes (Signed)
Shift summary:  - Patient still requiring full support from HFNC in addition to NRB. - No appreciable improvement in respiratory status.

## 2019-11-29 LAB — CBC
HCT: 45.6 % (ref 39.0–52.0)
Hemoglobin: 16 g/dL (ref 13.0–17.0)
MCH: 29 pg (ref 26.0–34.0)
MCHC: 35.1 g/dL (ref 30.0–36.0)
MCV: 82.8 fL (ref 80.0–100.0)
Platelets: 280 10*3/uL (ref 150–400)
RBC: 5.51 MIL/uL (ref 4.22–5.81)
RDW: 14.4 % (ref 11.5–15.5)
WBC: 11.6 10*3/uL — ABNORMAL HIGH (ref 4.0–10.5)
nRBC: 0 % (ref 0.0–0.2)

## 2019-11-29 LAB — COMPREHENSIVE METABOLIC PANEL
ALT: 32 U/L (ref 0–44)
AST: 20 U/L (ref 15–41)
Albumin: 2.4 g/dL — ABNORMAL LOW (ref 3.5–5.0)
Alkaline Phosphatase: 80 U/L (ref 38–126)
Anion gap: 9 (ref 5–15)
BUN: 36 mg/dL — ABNORMAL HIGH (ref 8–23)
CO2: 26 mmol/L (ref 22–32)
Calcium: 8.3 mg/dL — ABNORMAL LOW (ref 8.9–10.3)
Chloride: 97 mmol/L — ABNORMAL LOW (ref 98–111)
Creatinine, Ser: 0.8 mg/dL (ref 0.61–1.24)
GFR calc Af Amer: >60 mL/min (ref >60)
GFR calc non Af Amer: >60 mL/min (ref >60)
Glucose, Bld: 141 mg/dL — ABNORMAL HIGH (ref 70–99)
Potassium: 4.5 mmol/L (ref 3.5–5.1)
Sodium: 132 mmol/L — ABNORMAL LOW (ref 135–145)
Total Bilirubin: 1.2 mg/dL (ref 0.3–1.2)
Total Protein: 5.9 g/dL — ABNORMAL LOW (ref 6.5–8.1)

## 2019-11-29 LAB — PROCALCITONIN: Procalcitonin: <0.1 ng/mL

## 2019-11-29 NOTE — Progress Notes (Signed)
Shift summary:  - Patient still requiring NRB and HFNC. - OOB to chair for ~ 2 hours this AM; desat to 69% SPO2.

## 2019-11-29 NOTE — Progress Notes (Signed)
PT Cancellation Note  Patient Details Name: Rick Lowery MRN: [id number] DOB: [birth year]   Cancelled Treatment:    Reason Eval/Treat Not Completed: Medical issues which prohibited therapy (Per chart review, patient noted with transfer to CCU due to change in medical status.  Per guidelines, will require new orders to resume care.  Please re-consult as medically appropriate.)   Frederick Klinger H. Manson Passey, PT, DPT, NCS 11/29/19, 9:27 PM [phone]

## 2019-11-29 NOTE — Plan of Care (Signed)

## 2019-11-29 NOTE — Progress Notes (Signed)
PROGRESS NOTE    Rick Lowery  NLG:[id number] DOB: [birth year] DOA: 2019/12/17 PCP: Patient, No Pcp Per   Brief Narrative: Taken from H&P Rick Lowery is a 59 y.o. male with medical history significant for chronic neck and back pain who presents to the emergency room for evaluation of abdominal pain mostly around the periumbilical area. Found to be hypoxic and positive for COVID-19.  Patient is unvaccinated and denies any sick contacts.  Chest x-ray with bilateral infiltrates consistent with COVID-19 pneumonia.  Completed a course of remdesivir.  Continue to require higher level of oxygen.  Subjective: Patient was feeling little better when seen today.  He was saturating above 90% on 55 L and 90% FiO2.  No new complaints.  Assessment & Plan:   Principal Problem:   Pneumonia due to COVID-19 virus Active Problems:   Acute respiratory failure due to COVID-19 [facility])   Incarcerated ventral hernia   Transaminitis   Hyponatremia   Elevated LFTs   Insomnia   Impaired fasting glucose   Weakness  Acute hypoxic respiratory failure secondary to COVID-19 infection.  Patient continued to required high level of oxygen.  Completed a course of remdesivir.  Also received  ivermectin.  Repeated chest x-ray  with stable atypical pneumonia and a subtle new opacity in right midlung noted.   -Continue baricitinib-day 9 -Continue Solu-Medrol. -Aggressive pulmonary toileting. -Chest PT. -Continue supportive care and supplements. -Encourage proning. -Pulmonary was consulted-appreciate their help. -Consult palliative as patient does not want to be intubated and currently high risk for deterioration and death.  Transaminitis.  Most likely secondary to COVID-19 infection.  Liver enzymes improved. -Continue to monitor.  Physical deconditioning.  PT is recommending SNF placement.  Patient will be a difficult for disposition as he is uninsured.  Currently homeless. -Continue with PT while in the  hospital. -TOC to look for disposition once some improvement in his oxygen requirement.  Currently requiring a lot of oxygen which is not safe for discharge.  Objective: Vitals:   11/29/19 0200 11/29/19 0300 11/29/19 0400 11/29/19 0700  BP: 103/67 102/68 106/68 122/76  Pulse: 64 61 60 63  Resp: (!) 22 (!) 29 (!) 23 (!) 23  Temp:      TempSrc:      SpO2: 97% (!) 88% (!) 87% 91%  Weight:      Height:        Intake/Output Summary (Last 24 hours) at 11/29/2019 0744 Last data filed at 11/29/2019 0700 Gross per 24 hour  Intake 1205 ml  Output 880 ml  Net 325 ml   Filed Weights   12/17/2019 0747 11/27/19 0443 11/27/19 1237  Weight: 104.3 kg 88.4 kg 88.4 kg    Examination:  General.  Well-developed gentleman, in no acute distress. Pulmonary.  Lungs clear bilaterally, normal respiratory effort. CV.  Regular rate and rhythm, no JVD, rub or murmur. Abdomen.  Soft, nontender, nondistended, BS positive. CNS.  Alert and oriented x3.  No focal neurologic deficit. Extremities.  No edema, no cyanosis, pulses intact and symmetrical. Psychiatry.  Judgment and insight appears normal.  DVT prophylaxis: Lovenox Code Status: Full Family Communication: Nursing staff will communicate with his wife. Disposition Plan:  Status is: Inpatient  Remains inpatient appropriate because:Inpatient level of care appropriate due to severity of illness   Dispo: The patient is from: Home              Anticipated d/c is to: Home  Anticipated d/c date is: 3 days              Patient currently is not medically stable to d/c.  Patient is currently on heated HFNC, 55 L 90% FiO2 along with nonrebreather.  He is high risk for deterioration and death.  CODE STATUS changed to partial with no intubation according to his wishes.  Consultants:   Pulmonary  Palliative care  Procedures:  Antimicrobials:   Data Reviewed: I have personally reviewed following labs and imaging studies  CBC: Recent Labs   Lab 11/23/19 0600 11/23/19 0600 11/24/19 0545 11/26/19 0611 11/27/19 0447 11/28/19 0634 11/29/19 0413  WBC 7.7   < > 9.7 12.4* 12.3* 12.0* 11.6*  NEUTROABS 6.9  --  8.5*  --   --   --   --   HGB 15.4   < > 16.3 16.7 15.9 16.6 16.0  HCT 43.2   < > 46.3 47.4 43.8 48.3 45.6  MCV 83.4   < > 83.9 81.9 83.1 84.7 82.8  PLT 300   < > 306 295 257 272 280   < > = values in this interval not displayed.   Basic Metabolic Panel: Recent Labs  Lab 11/23/19 0600 11/23/19 0600 11/24/19 0545 11/26/19 0611 11/27/19 0447 11/28/19 0634 11/29/19 0413  NA 136   < > 132* 130* 131* 132* 132*  K 4.2   < > 4.8 4.6 4.6 4.4 4.5  CL 102   < > 98 94* 97* 94* 97*  CO2 26   < > 25 26 24 26 26   GLUCOSE 133*   < > 136* 140* 132* 132* 141*  BUN 17   < > 19 22 23  36* 36*  CREATININE 0.81   < > 0.90 0.86 0.80 0.96 0.80  CALCIUM 8.0*   < > 8.5* 8.5* 8.1* 8.5* 8.3*  MG 2.3  --  2.4  --   --  2.7*  --   PHOS 4.2  --  4.1  --   --  4.8*  --    < > = values in this interval not displayed.   GFR: Estimated Creatinine Clearance: 103.3 mL/min (by C-G formula based on SCr of 0.8 mg/dL). Liver Function Tests: Recent Labs  Lab 11/24/19 0545 11/26/19 0611 11/27/19 0447 11/28/19 0634 11/29/19 0413  AST 29 29 27 23 20   ALT 52* 42 36 36 32  ALKPHOS 114 109 85 88 80  BILITOT 1.2 1.3* 1.3* 1.4* 1.2  PROT 6.6 6.6 6.0* 6.5 5.9*  ALBUMIN 2.8* 2.7* 2.4* 2.7* 2.4*   No results for input(s): LIPASE, AMYLASE in the last 168 hours. No results for input(s): AMMONIA in the last 168 hours. Coagulation Profile: No results for input(s): INR, PROTIME in the last 168 hours. Cardiac Enzymes: No results for input(s): CKTOTAL, CKMB, CKMBINDEX, TROPONINI in the last 168 hours. BNP (last 3 results) No results for input(s): PROBNP in the last 8760 hours. HbA1C: No results for input(s): HGBA1C in the last 72 hours. CBG: Recent Labs  Lab 11/27/19 1214  GLUCAP 150*   Lipid Profile: No results for input(s): CHOL, HDL,  LDLCALC, TRIG, CHOLHDL, LDLDIRECT in the last 72 hours. Thyroid Function Tests: Recent Labs    11/27/19 0447  TSH 0.541  FREET4 1.32*   Anemia Panel: No results for input(s): VITAMINB12, FOLATE, FERRITIN, TIBC, IRON, RETICCTPCT in the last 72 hours. Sepsis Labs: Recent Labs  Lab 11/25/19 0945 11/28/19 0635  PROCALCITON <0.10 <0.10    Recent Results (from  the past 240 hour(s))  SARS Coronavirus 2 by RT PCR (hospital order, performed in [facility] hospital lab) Nasopharyngeal Nasopharyngeal Swab     Status: Abnormal   Collection Time: 2019-12-17  8:21 AM   Specimen: Nasopharyngeal Swab  Result Value Ref Range Status   SARS Coronavirus 2 POSITIVE (A) NEGATIVE Final    Comment: RESULT CALLED TO, READ BACK BY AND VERIFIED WITH: MEGAN JONES AT 0931 ON Dec 17, 2019 [facility]. (NOTE) SARS-CoV-2 target nucleic acids are DETECTED  SARS-CoV-2 RNA is generally detectable in upper respiratory specimens  during the acute phase of infection.  Positive results are indicative  of the presence of the identified virus, but do not rule out bacterial infection or co-infection with other pathogens not detected by the test.  Clinical correlation with patient history and  other diagnostic information is necessary to determine patient infection status.  The expected result is negative.  Fact Sheet for Patients:   BoilerBrush.com.cy   Fact Sheet for Healthcare Providers:   https://pope.com/    This test is not yet approved or cleared by the [place] FDA and  has been authorized for detection and/or diagnosis of SARS-CoV-2 by FDA under an Emergency Use Authorization (EUA).  This EUA will remain in effect (meaning this  test can be used) for the duration of  the COVID-19 declaration under Section 564(b)(1) of the Act, 21 U.S.C. section 360-bbb-3(b)(1), unless the authorization is terminated or revoked sooner.  Performed at [facility], [address] [place], [place] [address]   MRSA PCR Screening     Status: None   Collection Time: 11/27/19 12:31 PM   Specimen: Nasal Mucosa; Nasopharyngeal  Result Value Ref Range Status   MRSA by PCR NEGATIVE NEGATIVE Final    Comment:        The GeneXpert MRSA Assay (FDA approved for NASAL specimens only), is one component of a comprehensive MRSA colonization surveillance program. It is not intended to diagnose MRSA infection nor to guide or monitor treatment for MRSA infections. Performed at [facility], [address], [place], [place] [address]      Radiology Studies: [facility] Chest [place] 1 View  Result Date: 11/27/2019 CLINICAL DATA:  Hypoxia.  COVID-19 positive EXAM: PORTABLE CHEST 1 VIEW COMPARISON:  November 25, 2019 FINDINGS: Foci of airspace opacity scattered throughout the left lung as well as in the medial right base persist. New slight increased opacity in the right mid lung. Heart size and pulmonary vascular normal. No adenopathy. There is aortic atherosclerosis. No bone lesions. IMPRESSION: Multifocal airspace opacity consistent with atypical organism pneumonia. Similar changes on the left and right base regions compared to 2 days prior. Subtle new opacity right mid lung noted. Stable cardiac silhouette. Aortic Atherosclerosis (ICD10-I70.0). Electronically Signed   By: Bretta Bang III M.D.   On: 11/27/2019 09:50    Scheduled Meds: . albuterol  2 puff Inhalation Q6H  . vitamin C  500 mg Oral Daily  . Chlorhexidine Gluconate Cloth  6 each Topical Daily  . dextromethorphan-guaiFENesin  1 tablet Oral BID  . enoxaparin (LOVENOX) injection  40 mg Subcutaneous Q24H  . famotidine  20 mg Oral BID  . fluticasone  1 spray Each Nare Daily  . indomethacin  50 mg Oral TID WC  . sodium chloride flush  3 mL Intravenous Q12H  . traZODone  100 mg Oral QHS  . zinc sulfate  220 mg Oral Daily   Continuous Infusions: . sodium chloride Stopped (11/23/19 1030)  LOS:  10 days   Time spent: 30 minutes.  Arnetha CourserSumayya Jahad Old, MD [facility]  If 7PM-7AM, please contact night-coverage [email or web address]  11/29/2019, 7:44 AM   This record has been created using Conservation officer, historic buildingsDragon voice recognition software. Errors have been sought and corrected,but may not always be located. Such creation errors do not reflect on the standard of care.

## 2019-11-29 NOTE — Progress Notes (Signed)
Follow up - Critical Care Medicine Note  Patient Details:    Rick Lowery is an 59 y.o. male lifelong never smoker presented to [facility] 19 November 2019 for issues with an incarcerated ventral hernia.  In the process of being evaluated the patient was noted to have SARS-CoV-2 pneumonia.  Patient was admitted for management of acute respiratory failure with hypoxia due to COVID-19 pneumonia.  Patient transferred to the ICU/stepdown on 27 November 2019 due to increasing FiO2 requirements.  Lines, Airways, Drains: Peripheral IVs only  Anti-infectives:  Anti-infectives (From admission, onward)   Start     Dose/Rate Route Frequency Ordered Stop   11/24/19 1530  ivermectin (STROMECTOL) tablet 15,000 mcg        150 mcg/kg  104.3 kg Oral  Once 11/24/19 1519 11/24/19 1620   11/23/19 1545  ivermectin (STROMECTOL) tablet 15,000 mcg        150 mcg/kg  104.3 kg Oral  Once 11/23/19 1510 11/23/19 1642   11/22/19 1515  ivermectin (STROMECTOL) tablet 15,000 mcg        150 mcg/kg  104.3 kg Oral  Once 11/22/19 1506 11/22/19 1744   11/21/19 1530  ivermectin (STROMECTOL) tablet 15,000 mcg        150 mcg/kg  104.3 kg Oral  Once 11/21/19 1512 11/21/19 1613   11/20/19 1000  remdesivir 100 mg in sodium chloride 0.9 % 100 mL IVPB  Status:  Discontinued       "Followed by" Linked Group Details   100 mg 200 mL/hr over 30 Minutes Intravenous Daily 12/05/2019 1003 11/29/2019 1029   11/20/19 1000  remdesivir 100 mg in sodium chloride 0.9 % 100 mL IVPB       "Followed by" Linked Group Details   100 mg 200 mL/hr over 30 Minutes Intravenous Daily 11/30/2019 1024 11/23/19 0950   11/23/2019 1200  remdesivir 200 mg in sodium chloride 0.9% 250 mL IVPB  Status:  Discontinued       "Followed by" Linked Group Details   200 mg 580 mL/hr over 30 Minutes Intravenous Once 12/02/2019 1003 12/02/2019 1029   11/27/2019 1200  remdesivir 200 mg in sodium chloride 0.9% 250 mL IVPB       "Followed by" Linked Group Details   200 mg 580  mL/hr over 30 Minutes Intravenous Once 12/07/2019 1024 12/15/2019 1918     Scheduled Meds: . albuterol  2 puff Inhalation Q6H  . vitamin C  500 mg Oral Daily  . Chlorhexidine Gluconate Cloth  6 each Topical Daily  . dextromethorphan-guaiFENesin  1 tablet Oral BID  . enoxaparin (LOVENOX) injection  40 mg Subcutaneous Q24H  . famotidine  20 mg Oral BID  . fluticasone  1 spray Each Nare Daily  . indomethacin  50 mg Oral TID WC  . sodium chloride flush  3 mL Intravenous Q12H  . traZODone  100 mg Oral QHS  . zinc sulfate  220 mg Oral Daily   Continuous Infusions: . sodium chloride Stopped (11/23/19 1030)   PRN Meds:.sodium chloride, acetaminophen, guaiFENesin-dextromethorphan, ondansetron **OR** ondansetron (ZOFRAN) IV, sodium chloride, sodium chloride flush   Microbiology: Results for orders placed or performed during the hospital encounter of 12/02/2019  SARS Coronavirus 2 by RT PCR (hospital order, performed in [facility] hospital lab) Nasopharyngeal Nasopharyngeal Swab     Status: Abnormal   Collection Time: 12/16/2019  8:21 AM   Specimen: Nasopharyngeal Swab  Result Value Ref Range Status   SARS Coronavirus 2 POSITIVE (A) NEGATIVE Final  Comment: RESULT CALLED TO, READ BACK BY AND VERIFIED WITH: MEGAN JONES AT 0931 ON 12-09-2019 [facility]. (NOTE) SARS-CoV-2 target nucleic acids are DETECTED  SARS-CoV-2 RNA is generally detectable in upper respiratory specimens  during the acute phase of infection.  Positive results are indicative  of the presence of the identified virus, but do not rule out bacterial infection or co-infection with other pathogens not detected by the test.  Clinical correlation with patient history and  other diagnostic information is necessary to determine patient infection status.  The expected result is negative.  Fact Sheet for Patients:   BoilerBrush.com.cy   Fact Sheet for Healthcare Providers:   https://pope.com/     This test is not yet approved or cleared by the [place] FDA and  has been authorized for detection and/or diagnosis of SARS-CoV-2 by FDA under an Emergency Use Authorization (EUA).  This EUA will remain in effect (meaning this  test can be used) for the duration of  the COVID-19 declaration under Section 564(b)(1) of the Act, 21 U.S.C. section 360-bbb-3(b)(1), unless the authorization is terminated or revoked sooner.  Performed at [facility], [address] [place], [place] [address]   MRSA PCR Screening     Status: None   Collection Time: 11/27/19 12:31 PM   Specimen: Nasal Mucosa; Nasopharyngeal  Result Value Ref Range Status   MRSA by PCR NEGATIVE NEGATIVE Final    Comment:        The GeneXpert MRSA Assay (FDA approved for NASAL specimens only), is one component of a comprehensive MRSA colonization surveillance program. It is not intended to diagnose MRSA infection nor to guide or monitor treatment for MRSA infections. Performed at [facility], [address] [place], [place] [address]    Results for orders placed or performed during the hospital encounter of December 09, 2019 (from the past 24 hour(s))  Procalcitonin     Status: None   Collection Time: 11/29/19  4:13 AM  Result Value Ref Range   Procalcitonin <0.10 ng/mL  Comprehensive metabolic panel     Status: Abnormal   Collection Time: 11/29/19  4:13 AM  Result Value Ref Range   Sodium 132 (L) 135 - 145 mmol/L   Potassium 4.5 3.5 - 5.1 mmol/L   Chloride 97 (L) 98 - 111 mmol/L   CO2 26 22 - 32 mmol/L   Glucose, Bld 141 (H) 70 - 99 mg/dL   BUN 36 (H) 8 - 23 mg/dL   Creatinine, Ser 2.44 0.61 - 1.24 mg/dL   Calcium 8.3 (L) 8.9 - 10.3 mg/dL   Total Protein 5.9 (L) 6.5 - 8.1 g/dL   Albumin 2.4 (L) 3.5 - 5.0 g/dL   AST 20 15 - 41 U/L   ALT 32 0 - 44 U/L   Alkaline Phosphatase 80 38 - 126 U/L   Total Bilirubin 1.2 0.3 - 1.2 mg/dL   GFR calc non Af Amer >60 >60 mL/min   GFR calc Af Amer  >60 >60 mL/min   Anion gap 9 5 - 15  CBC     Status: Abnormal   Collection Time: 11/29/19  4:13 AM  Result Value Ref Range   WBC 11.6 (H) 4.0 - 10.5 K/uL   RBC 5.51 4.22 - 5.81 MIL/uL   Hemoglobin 16.0 13.0 - 17.0 g/dL   HCT 01.0 39 - 52 %   MCV 82.8 80.0 - 100.0 fL   MCH 29.0 26.0 - 34.0 pg   MCHC 35.1 30.0 - 36.0 g/dL  RDW 14.4 11.5 - 15.5 %   Platelets 280 150 - 400 K/uL   nRBC 0.0 0.0 - 0.2 %     Best Practice/Protocols:  VTE Prophylaxis: Direct Thrombin Inhibitor GI Prophylaxis: Antihistamine   Events: 09/02: Admitted with COVID-19 pneumonia 09/10: Transferred to ICU/stepdown due to increasing FiO2 requirements high flow nasal cannula O2 +100% nonrebreather.  Patient self proning, using incentive spirometer. 09/11: Persistent issues with FiO2 requirements, on high flow nasal cannula 50 L, 100% FiO2.  Studies: CT ANGIO CHEST PE W OR WO CONTRAST  Result Date: 11/22/2019 CLINICAL DATA:  Increasing shortness of breath in a patient with COVID-19 infection. EXAM: CT ANGIOGRAPHY CHEST WITH CONTRAST TECHNIQUE: Multidetector CT imaging of the chest was performed using the standard protocol during bolus administration of intravenous contrast. Multiplanar CT image reconstructions and MIPs were obtained to evaluate the vascular anatomy. CONTRAST:  OMNIPAQUE IOHEXOL 350 MG/ML SOLN COMPARISON:  Chest radiograph November 19, 2019 FINDINGS: Cardiovascular: Satisfactory opacification of the pulmonary arteries to the segmental level. No evidence of pulmonary embolism. Mildly enlarged heart. No pericardial effusion. Mediastinum/Nodes: No enlarged mediastinal, hilar, or axillary lymph nodes. Thyroid gland, trachea, and esophagus demonstrate no significant findings. Lungs/Pleura: Widespread ground-glass and confluent airspace consolidation throughout both lungs with lower lobe and dependent distribution, consistent with moderate to severe COVID-19 pneumonia. 7 mm solid soft tissue nodule in the  right middle lobe, image 177/323. Upper Abdomen: No acute abnormality. Musculoskeletal: No chest wall abnormality. No acute or significant osseous findings. Review of the MIP images confirms the above findings. IMPRESSION: 1. No evidence of pulmonary embolus. 2. Widespread ground-glass and confluent airspace consolidation throughout both lungs with lower lobe and dependent distribution, consistent with moderate to severe COVID-19 pneumonia. 3. 7 mm solid soft tissue nodule in the right middle lobe. Non-contrast chest CT at 6-12 months is recommended. If the nodule is stable at time of repeat CT, then future CT at 18-24 months (from today's scan) is considered optional for low-risk patients, but is recommended for high-risk patients. This recommendation follows the consensus statement: Guidelines for Management of Incidental Pulmonary Nodules Detected on CT Images: From the Fleischner Society 2017; Radiology 2017; 284:228-243. 4. Mildly enlarged heart. Electronically Signed   By: Ted Mcalpine M.D.   On: 11/22/2019 12:41   US Venous Img Lower Bilateral (DVT)  Result Date: 11/24/2019 CLINICAL DATA:  Shortness of breath. EXAM: BILATERAL LOWER EXTREMITY VENOUS DOPPLER ULTRASOUND TECHNIQUE: Gray-scale sonography with compression, as well as color and duplex ultrasound, were performed to evaluate the deep venous system(s) from the level of the common femoral vein through the popliteal and proximal calf veins. COMPARISON:  None. FINDINGS: VENOUS Normal compressibility of the common femoral, superficial femoral, and popliteal veins, as well as the visualized calf veins. Visualized portions of profunda femoral vein and great saphenous vein unremarkable. No filling defects to suggest DVT on grayscale or color Doppler imaging. Doppler waveforms show normal direction of venous flow, normal respiratory plasticity and response to augmentation. OTHER None. Limitations: none IMPRESSION: Negative. Electronically Signed    By: Katherine Mantle M.D.   On: 11/24/2019 15:35   DG Chest Port 1 View  Result Date: 11/27/2019 CLINICAL DATA:  Hypoxia.  COVID-19 positive EXAM: PORTABLE CHEST 1 VIEW COMPARISON:  November 25, 2019 FINDINGS: Foci of airspace opacity scattered throughout the left lung as well as in the medial right base persist. New slight increased opacity in the right mid lung. Heart size and pulmonary vascular normal. No adenopathy. There is aortic atherosclerosis.  No bone lesions. IMPRESSION: Multifocal airspace opacity consistent with atypical organism pneumonia. Similar changes on the left and right base regions compared to 2 days prior. Subtle new opacity right mid lung noted. Stable cardiac silhouette. Aortic Atherosclerosis (ICD10-I70.0). Electronically Signed   By: Bretta Bang III M.D.   On: 11/27/2019 09:50   DG Chest Port 1 View  Result Date: 11/25/2019 CLINICAL DATA:  Shortness of breath EXAM: PORTABLE CHEST 1 VIEW COMPARISON:  CT from 3 days ago FINDINGS: Atypical pneumonia with asymmetric involvement on the left. Cardiomegaly. No visible effusion or pneumothorax. IMPRESSION: Atypical pneumonia without progression since study 6 days ago. Cardiomegaly Electronically Signed   By: Marnee Spring M.D.   On: 11/25/2019 06:55   DG Chest Portable 1 View  Result Date: 08-Dec-2019 CLINICAL DATA:  Hypoxia and fever EXAM: PORTABLE CHEST 1 VIEW COMPARISON:  None. FINDINGS: Patchy bilateral airspace disease. Low lung volumes. Prominent heart size, accentuated by low volume chest. No visible effusion or pneumothorax. IMPRESSION: Atypical pneumonia pattern with low lung volumes. Electronically Signed   By: Marnee Spring M.D.   On: 12-08-2019 08:48    Consults: Treatment Team:  Salena Saner, MD   Subjective:    Overnight Issues: Overnight no issues.  Patient has no overt complaints.  Objective:  Vital signs for last 24 hours: Temp:  [97.5 F (36.4 C)-98.8 F (37.1 C)] 98.2 F (36.8 C) (09/12  2000) Pulse Rate:  [60-98] 71 (09/12 2200) Resp:  [20-34] 24 (09/12 2200) BP: (92-125)/(57-84) 94/60 (09/12 2200) SpO2:  [74 %-97 %] 89 % (09/12 2200) FiO2 (%):  [100 %] 100 % (09/12 1600)  Hemodynamic parameters for last 24 hours:    Intake/Output from previous day: 09/11 0701 - 09/12 0700 In: 1205 [P.O.:1200; I.V.:5] Out: 880 [Urine:880]  Intake/Output this shift: Total I/O In: -  Out: 250 [Urine:250]  Vent settings for last 24 hours: FiO2 (%):  [100 %] 100 %  Physical Exam:  Physical Exam physical examination is limited due to need for PPE/CAPR GENERAL: Well-developed well-nourished gentleman in no respiratory distress comfortable on high flow O2 and nonrebreather with mask. HEAD: Normocephalic, atraumatic.  EYES: Pupils equal, round, reactive to light.  No scleral icterus.  MOUTH: Oral mucosa moist NECK: Supple. No thyromegaly. Trachea midline. No JVD.  No adenopathy. PULMONARY: Good air entry bilaterally.  Coarse breath sounds, difficult to appreciate any other adventitious sounds due to PPE CARDIOVASCULAR:Regular rate and rhythm noted on monitor.  ABDOMEN: Soft, nondistended, normoactive bowel sounds.  No hepatosplenomegaly.  Ventral hernia, currently reducible. MUSCULOSKELETAL: No joint deformity, no clubbing, no edema.  NEUROLOGIC: No focal deficits.  Speech is fluent.   SKIN: Intact,warm,dry.  No rashes. PSYCH: Mood and behavior appear normal  Assessment/Plan:  Acute respiratory failure with hypoxia due to COVID-19 Continue supplemental oxygen to keep saturations between 86 to 90% Titrate FiO2 down as tolerated Continue prone positioning as tolerates Continue use of incentive spirometry while awake Continue pulmonary toilet Patient continues to decline intubation  COVID-19 pneumonia On remdesivir, baricitinib and methylprednisolone Added indomethacin to help with cough/intrapulmonary shunting Supportive care  Transaminitis Likely due to  COVID-19 Resolving  Elevated fibrin derivatives Empirically placed on Eliquis, no evidence of PE Switched to Lovenox for DVT prophylaxis Eliquis DC'd  Physical deconditioning Weakness Thyroid function normal Per PT the patient will need SNF    LOS: 10 days   Additional comments: Continue to update patient's wife, Boneta Lucks, via phone.  Care coordination performed with patient's nurse.    C.  Danice GoltzLaura Treshawn Allen, MD [facility] 11/29/2019  *This note was dictated using voice recognition software/Dragon.  Despite best efforts to proofread, errors can occur which can change the meaning.  Any change was purely unintentional..

## 2019-11-30 ENCOUNTER — Inpatient Hospital Stay: Payer: HRSA Program

## 2019-11-30 LAB — COMPREHENSIVE METABOLIC PANEL
ALT: 29 U/L (ref 0–44)
AST: 25 U/L (ref 15–41)
Albumin: 2.5 g/dL — ABNORMAL LOW (ref 3.5–5.0)
Alkaline Phosphatase: 81 U/L (ref 38–126)
Anion gap: 10 (ref 5–15)
BUN: 45 mg/dL — ABNORMAL HIGH (ref 8–23)
CO2: 27 mmol/L (ref 22–32)
Calcium: 8.4 mg/dL — ABNORMAL LOW (ref 8.9–10.3)
Chloride: 95 mmol/L — ABNORMAL LOW (ref 98–111)
Creatinine, Ser: 1.18 mg/dL (ref 0.61–1.24)
GFR calc Af Amer: >60 mL/min (ref >60)
GFR calc non Af Amer: >60 mL/min (ref >60)
Glucose, Bld: 89 mg/dL (ref 70–99)
Potassium: 4.3 mmol/L (ref 3.5–5.1)
Sodium: 132 mmol/L — ABNORMAL LOW (ref 135–145)
Total Bilirubin: 1.3 mg/dL — ABNORMAL HIGH (ref 0.3–1.2)
Total Protein: 6 g/dL — ABNORMAL LOW (ref 6.5–8.1)

## 2019-11-30 LAB — CBC
HCT: 45.5 % (ref 39.0–52.0)
Hemoglobin: 16.4 g/dL (ref 13.0–17.0)
MCH: 29.8 pg (ref 26.0–34.0)
MCHC: 36 g/dL (ref 30.0–36.0)
MCV: 82.6 fL (ref 80.0–100.0)
Platelets: 263 10*3/uL (ref 150–400)
RBC: 5.51 MIL/uL (ref 4.22–5.81)
RDW: 14.6 % (ref 11.5–15.5)
WBC: 13.7 10*3/uL — ABNORMAL HIGH (ref 4.0–10.5)
nRBC: 0 % (ref 0.0–0.2)

## 2019-11-30 LAB — PROCALCITONIN: Procalcitonin: <0.1 ng/mL

## 2019-11-30 MED ORDER — SODIUM CHLORIDE 0.9 % IV SOLN: INTRAVENOUS | Status: DC

## 2019-11-30 MED ORDER — FUROSEMIDE 10 MG/ML IJ SOLN: 40.0000 mg | Freq: Once | INTRAMUSCULAR | Status: AC

## 2019-11-30 MED ORDER — METHYLPREDNISOLONE SODIUM SUCC 40 MG IJ SOLR
20.0000 mg | Freq: Two times a day (BID) | INTRAMUSCULAR | Status: DC
  Administered 2019-11-30 – 2019-12-01 (×4): 20 mg via INTRAVENOUS
  Filled 2019-11-30 (×4): qty 1

## 2019-11-30 MED ORDER — FUROSEMIDE 10 MG/ML IJ SOLN
INTRAMUSCULAR | Status: AC
  Administered 2019-11-30: 40 mg via INTRAVENOUS
  Filled 2019-11-30: qty 4

## 2019-11-30 NOTE — Progress Notes (Signed)
PROGRESS NOTE    Rick Lowery  WVP:[id number] DOB: [birth year] DOA: 11/29/2019 PCP: Patient, No Pcp Per   Brief Narrative: Taken from H&P Rick Lowery is a 59 y.o. male with medical history significant for chronic neck and back pain who presents to the emergency room for evaluation of abdominal pain mostly around the periumbilical area. Found to be hypoxic and positive for COVID-19.  Patient is unvaccinated and denies any sick contacts.  Chest x-ray with bilateral infiltrates consistent with COVID-19 pneumonia.  Completed a course of remdesivir.  Continue to require higher level of oxygen.  Subjective: Patient continued to require maximum setting at heated high flow along with nonrebreather, saturating in high 80s to 90%.  No other complaint.  Assessment & Plan:   Principal Problem:   Pneumonia due to COVID-19 virus Active Problems:   Acute respiratory failure due to COVID-19 [facility])   Incarcerated ventral hernia   Transaminitis   Hyponatremia   Elevated LFTs   Insomnia   Impaired fasting glucose   Weakness  Acute hypoxic respiratory failure secondary to COVID-19 infection.  Patient continued to required high level of oxygen.  Completed a course of remdesivir.  Also received  ivermectin.  Repeated chest x-ray  with stable atypical pneumonia and a subtle new opacity in right midlung noted.   Baricitinib was discontinued due to worsening leukocytosis.  Procalcitonin remained negative. -Continue Solu-Medrol. -Aggressive pulmonary toileting. -Chest PT. -Continue supportive care and supplements. -Encourage proning. -Pulmonary was consulted-appreciate their help. -Consult palliative as patient does not want to be intubated and currently high risk for deterioration and death.  Transaminitis.  Most likely secondary to COVID-19 infection.  Liver enzymes improved. -Continue to monitor.  Physical deconditioning.  PT is recommending SNF placement.  Patient will be a difficult for  disposition as he is uninsured.  Currently homeless. -Continue with PT while in the hospital. -TOC to look for disposition once some improvement in his oxygen requirement.  Currently requiring a lot of oxygen which is not safe for discharge.  Objective: Vitals:   11/29/19 2300 11/30/19 0000 11/30/19 0300 11/30/19 0400  BP: 107/64 105/64 90/68 100/67  Pulse: 91 86 69 77  Resp: (!) 29 (!) 28 (!) 26 (!) 26  Temp:  98.9 F (37.2 C)  98.2 F (36.8 C)  TempSrc:  Axillary  Axillary  SpO2: (!) 89% (!) 82% 90% 91%  Weight:      Height:        Intake/Output Summary (Last 24 hours) at 11/30/2019 0740 Last data filed at 11/30/2019 0600 Gross per 24 hour  Intake 520 ml  Output 730 ml  Net -210 ml   Filed Weights   12/16/2019 0747 11/27/19 0443 11/27/19 1237  Weight: 104.3 kg 88.4 kg 88.4 kg    Examination:  General.  Chronically ill-appearing gentleman, in no acute distress. Pulmonary.  Lungs clear bilaterally, normal respiratory effort. CV.  Regular rate and rhythm, no JVD, rub or murmur. Abdomen.  Soft, nontender, nondistended, BS positive. CNS.  Alert and oriented x3.  No focal neurologic deficit. Extremities.  No edema, no cyanosis, pulses intact and symmetrical. Psychiatry.  Judgment and insight appears normal.  DVT prophylaxis: Lovenox Code Status: Full Family Communication: Nursing staff will communicate with his wife. Disposition Plan:  Status is: Inpatient  Remains inpatient appropriate because:Inpatient level of care appropriate due to severity of illness   Dispo: The patient is from: Home              Anticipated  d/c is to: Home              Anticipated d/c date is: 3 days              Patient currently is not medically stable to d/c.  Patient is currently on heated HFNC, 55 L 100% FiO2 along with nonrebreather.  He is high risk for deterioration and death.  CODE STATUS changed to partial with no intubation according to his wishes.  Consultants:    Pulmonary  Palliative care  Procedures:  Antimicrobials:   Data Reviewed: I have personally reviewed following labs and imaging studies  CBC: Recent Labs  Lab 11/24/19 0545 11/24/19 0545 11/26/19 0611 11/27/19 0447 11/28/19 0634 11/29/19 0413 11/30/19 0502  WBC 9.7   < > 12.4* 12.3* 12.0* 11.6* 13.7*  NEUTROABS 8.5*  --   --   --   --   --   --   HGB 16.3   < > 16.7 15.9 16.6 16.0 16.4  HCT 46.3   < > 47.4 43.8 48.3 45.6 45.5  MCV 83.9   < > 81.9 83.1 84.7 82.8 82.6  PLT 306   < > 295 257 272 280 263   < > = values in this interval not displayed.   Basic Metabolic Panel: Recent Labs  Lab 11/24/19 0545 11/24/19 0545 11/26/19 5643 11/27/19 0447 11/28/19 0634 11/29/19 0413 11/30/19 0502  NA 132*   < > 130* 131* 132* 132* 132*  K 4.8   < > 4.6 4.6 4.4 4.5 4.3  CL 98   < > 94* 97* 94* 97* 95*  CO2 25   < > 26 24 26 26 27   GLUCOSE 136*   < > 140* 132* 132* 141* 89  BUN 19   < > 22 23 36* 36* 45*  CREATININE 0.90   < > 0.86 0.80 0.96 0.80 1.18  CALCIUM 8.5*   < > 8.5* 8.1* 8.5* 8.3* 8.4*  MG 2.4  --   --   --  2.7*  --   --   PHOS 4.1  --   --   --  4.8*  --   --    < > = values in this interval not displayed.   GFR: Estimated Creatinine Clearance: 70 mL/min (by C-G formula based on SCr of 1.18 mg/dL). Liver Function Tests: Recent Labs  Lab 11/26/19 0611 11/27/19 0447 11/28/19 0634 11/29/19 0413 11/30/19 0502  AST 29 27 23 20 25   ALT 42 36 36 32 29  ALKPHOS 109 85 88 80 81  BILITOT 1.3* 1.3* 1.4* 1.2 1.3*  PROT 6.6 6.0* 6.5 5.9* 6.0*  ALBUMIN 2.7* 2.4* 2.7* 2.4* 2.5*   No results for input(s): LIPASE, AMYLASE in the last 168 hours. No results for input(s): AMMONIA in the last 168 hours. Coagulation Profile: No results for input(s): INR, PROTIME in the last 168 hours. Cardiac Enzymes: No results for input(s): CKTOTAL, CKMB, CKMBINDEX, TROPONINI in the last 168 hours. BNP (last 3 results) No results for input(s): PROBNP in the last 8760  hours. HbA1C: No results for input(s): HGBA1C in the last 72 hours. CBG: Recent Labs  Lab 11/27/19 1214  GLUCAP 150*   Lipid Profile: No results for input(s): CHOL, HDL, LDLCALC, TRIG, CHOLHDL, LDLDIRECT in the last 72 hours. Thyroid Function Tests: No results for input(s): TSH, T4TOTAL, FREET4, T3FREE, THYROIDAB in the last 72 hours. Anemia Panel: No results for input(s): VITAMINB12, FOLATE, FERRITIN, TIBC, IRON, RETICCTPCT in the last  72 hours. Sepsis Labs: Recent Labs  Lab 11/25/19 0945 11/28/19 0635 11/29/19 0413  PROCALCITON <0.10 <0.10 <0.10    Recent Results (from the past 240 hour(s))  MRSA PCR Screening     Status: None   Collection Time: 11/27/19 12:31 PM   Specimen: Nasal Mucosa; Nasopharyngeal  Result Value Ref Range Status   MRSA by PCR NEGATIVE NEGATIVE Final    Comment:        The GeneXpert MRSA Assay (FDA approved for NASAL specimens only), is one component of a comprehensive MRSA colonization surveillance program. It is not intended to diagnose MRSA infection nor to guide or monitor treatment for MRSA infections. Performed at [facility], [address], [place], [place] [address]      Radiology Studies: No results found.  Scheduled Meds: . albuterol  2 puff Inhalation Q6H  . vitamin C  500 mg Oral Daily  . Chlorhexidine Gluconate Cloth  6 each Topical Daily  . dextromethorphan-guaiFENesin  1 tablet Oral BID  . enoxaparin (LOVENOX) injection  40 mg Subcutaneous Q24H  . famotidine  20 mg Oral BID  . fluticasone  1 spray Each Nare Daily  . indomethacin  50 mg Oral TID WC  . sodium chloride flush  3 mL Intravenous Q12H  . traZODone  100 mg Oral QHS  . zinc sulfate  220 mg Oral Daily   Continuous Infusions: . sodium chloride Stopped (11/23/19 1030)  . sodium chloride       LOS: 11 days   Time spent: 30 minutes.  Arnetha Courser, MD [facility]  If 7PM-7AM, please contact night-coverage [email or web address]  11/30/2019,  7:40 AM   This record has been created using Conservation officer, historic buildings. Errors have been sought and corrected,but may not always be located. Such creation errors do not reflect on the standard of care.

## 2019-11-30 NOTE — Progress Notes (Signed)
Patient remains alert and oriented of heated high flow at 55L fi02 100% wearing 100% non-rebreather.  Patient does desat easily when taking pills and eating but recovers within 2-4 minutes.  Patient tolerated getting out of bed to chair with minimal assistance.  02 SATs dropped but patient recovered within 2-4 minutes.

## 2019-11-30 NOTE — Progress Notes (Signed)
Follow up - Critical Care Medicine Note  Patient Details:    Rick Lowery is an 59 y.o. male w/chronic neck and back pain who presented to [facility]14/2019 for issues with an incarcerated ventral hernia. In the process of being evaluated the patient was noted to by hypoxic and tested postive for COVID-19. Patient was admitted for management of acute respiratory failure with hypoxia due to COVID-19 pneumonia. Patient transferred to the ICU/stepdown on 27 November 2019 due to increasing FiO2 requirements.  Patient w/worsening oxygenation status this evening and critical care called into room.   Lines, Airways, Drains: Peripheral IVs only  Anti-infectives:  Anti-infectives (From admission, onward)   Start     Dose/Rate Route Frequency Ordered Stop   11/24/19 1530  ivermectin (STROMECTOL) tablet 15,000 mcg        150 mcg/kg  104.3 kg Oral  Once 11/24/19 1519 11/24/19 1620   11/23/19 1545  ivermectin (STROMECTOL) tablet 15,000 mcg        150 mcg/kg  104.3 kg Oral  Once 11/23/19 1510 11/23/19 1642   11/22/19 1515  ivermectin (STROMECTOL) tablet 15,000 mcg        150 mcg/kg  104.3 kg Oral  Once 11/22/19 1506 11/22/19 1744   11/21/19 1530  ivermectin (STROMECTOL) tablet 15,000 mcg        150 mcg/kg  104.3 kg Oral  Once 11/21/19 1512 11/21/19 1613   11/20/19 1000  remdesivir 100 mg in sodium chloride 0.9 % 100 mL IVPB  Status:  Discontinued       "Followed by" Linked Group Details   100 mg 200 mL/hr over 30 Minutes Intravenous Daily 04/27/19 1003 04/27/19 1029   11/20/19 1000  remdesivir 100 mg in sodium chloride 0.9 % 100 mL IVPB       "Followed by" Linked Group Details   100 mg 200 mL/hr over 30 Minutes Intravenous Daily 04/27/19 1024 11/23/19 0950   04/27/19 1200  remdesivir 200 mg in sodium chloride 0.9% 250 mL IVPB  Status:  Discontinued       "Followed by" Linked Group Details   200 mg 580 mL/hr over 30 Minutes Intravenous Once 04/27/19 1003 04/27/19 1029   04/27/19 1200   remdesivir 200 mg in sodium chloride 0.9% 250 mL IVPB       "Followed by" Linked Group Details   200 mg 580 mL/hr over 30 Minutes Intravenous Once 04/27/19 1024 04/27/19 1918     Scheduled Meds: . albuterol  2 puff Inhalation Q6H  . vitamin C  500 mg Oral Daily  . Chlorhexidine Gluconate Cloth  6 each Topical Daily  . dextromethorphan-guaiFENesin  1 tablet Oral BID  . enoxaparin (LOVENOX) injection  40 mg Subcutaneous Q24H  . famotidine  20 mg Oral BID  . fluticasone  1 spray Each Nare Daily  . indomethacin  50 mg Oral TID WC  . methylPREDNISolone (SOLU-MEDROL) injection  20 mg Intravenous Q12H  . sodium chloride flush  3 mL Intravenous Q12H  . traZODone  100 mg Oral QHS  . zinc sulfate  220 mg Oral Daily   Continuous Infusions: . sodium chloride Stopped (11/23/19 1030)  . sodium chloride 75 mL/hr at 11/30/19 2000   PRN Meds:.sodium chloride, acetaminophen, guaiFENesin-dextromethorphan, ondansetron **OR** ondansetron (ZOFRAN) IV, sodium chloride, sodium chloride flush   Microbiology: Results for orders placed or performed during the hospital encounter of 04/27/19  SARS Coronavirus 2 by RT PCR (hospital order, performed in [facility] lab) Nasopharyngeal Nasopharyngeal Swab  Status: Abnormal   Collection Time: 12/05/2019  8:21 AM   Specimen: Nasopharyngeal Swab  Result Value Ref Range Status   SARS Coronavirus 2 POSITIVE (A) NEGATIVE Final    Comment: RESULT CALLED TO, READ BACK BY AND VERIFIED WITH: MEGAN JONES AT 0931 ON 12/16/2019 [facility]. (NOTE) SARS-CoV-2 target nucleic acids are DETECTED  SARS-CoV-2 RNA is generally detectable in upper respiratory specimens  during the acute phase of infection.  Positive results are indicative  of the presence of the identified virus, but do not rule out bacterial infection or co-infection with other pathogens not detected by the test.  Clinical correlation with patient history and  other diagnostic information is necessary to  determine patient infection status.  The expected result is negative.  Fact Sheet for Patients:   BoilerBrush.com.cy   Fact Sheet for Healthcare Providers:   https://pope.com/    This test is not yet approved or cleared by the [place] FDA and  has been authorized for detection and/or diagnosis of SARS-CoV-2 by FDA under an Emergency Use Authorization (EUA).  This EUA will remain in effect (meaning this  test can be used) for the duration of  the COVID-19 declaration under Section 564(b)(1) of the Act, 21 U.S.C. section 360-bbb-3(b)(1), unless the authorization is terminated or revoked sooner.  Performed at [facility], [address] [place], [place] [address]   MRSA PCR Screening     Status: None   Collection Time: 11/27/19 12:31 PM   Specimen: Nasal Mucosa; Nasopharyngeal  Result Value Ref Range Status   MRSA by PCR NEGATIVE NEGATIVE Final    Comment:        The GeneXpert MRSA Assay (FDA approved for NASAL specimens only), is one component of a comprehensive MRSA colonization surveillance program. It is not intended to diagnose MRSA infection nor to guide or monitor treatment for MRSA infections. Performed at [facility], [address] [place], [place] [address]    Results for orders placed or performed during the hospital encounter of 12/13/2019 (from the past 24 hour(s))  Procalcitonin     Status: None   Collection Time: 11/30/19  5:02 AM  Result Value Ref Range   Procalcitonin <0.10 ng/mL  Comprehensive metabolic panel     Status: Abnormal   Collection Time: 11/30/19  5:02 AM  Result Value Ref Range   Sodium 132 (L) 135 - 145 mmol/L   Potassium 4.3 3.5 - 5.1 mmol/L   Chloride 95 (L) 98 - 111 mmol/L   CO2 27 22 - 32 mmol/L   Glucose, Bld 89 70 - 99 mg/dL   BUN 45 (H) 8 - 23 mg/dL   Creatinine, Ser 4.70 0.61 - 1.24 mg/dL   Calcium 8.4 (L) 8.9 - 10.3 mg/dL   Total Protein 6.0 (L) 6.5 -  8.1 g/dL   Albumin 2.5 (L) 3.5 - 5.0 g/dL   AST 25 15 - 41 U/L   ALT 29 0 - 44 U/L   Alkaline Phosphatase 81 38 - 126 U/L   Total Bilirubin 1.3 (H) 0.3 - 1.2 mg/dL   GFR calc non Af Amer >60 >60 mL/min   GFR calc Af Amer >60 >60 mL/min   Anion gap 10 5 - 15  CBC     Status: Abnormal   Collection Time: 11/30/19  5:02 AM  Result Value Ref Range   WBC 13.7 (H) 4.0 - 10.5 K/uL   RBC 5.51 4.22 - 5.81 MIL/uL   Hemoglobin 16.4 13.0 - 17.0 g/dL  HCT 45.5 39 - 52 %   MCV 82.6 80.0 - 100.0 fL   MCH 29.8 26.0 - 34.0 pg   MCHC 36.0 30.0 - 36.0 g/dL   RDW 18.8 41.6 - 60.6 %   Platelets 263 150 - 400 K/uL   nRBC 0.0 0.0 - 0.2 %     Best Practice/Protocols:  DVT Prophylaxis: Lovenox GI Prophylaxis: Pepcid   Events: 09/02: Admitted with COVID-19 pneumonia 09/10: Transferred to ICU/stepdown due to increasing FiO2 requirements high flow nasal cannula O2 +100% nonrebreather.  Patient self proning, using incentive spirometer. 09/11: Persistent issues with FiO2 requirements, on high flow nasal cannula 50 L, 100% FiO2. 09/13: Called into room due to sats in the 70s on Optiflow 100% and non-rebreather over cannula. Refusing Bipap and proning.   Studies: CT ANGIO CHEST PE W OR WO CONTRAST  Result Date: 11/22/2019 CLINICAL DATA:  Increasing shortness of breath in a patient with COVID-19 infection. EXAM: CT ANGIOGRAPHY CHEST WITH CONTRAST TECHNIQUE: Multidetector CT imaging of the chest was performed using the standard protocol during bolus administration of intravenous contrast. Multiplanar CT image reconstructions and MIPs were obtained to evaluate the vascular anatomy. CONTRAST:  OMNIPAQUE IOHEXOL 350 MG/ML SOLN COMPARISON:  Chest radiograph 2019/12/04 FINDINGS: Cardiovascular: Satisfactory opacification of the pulmonary arteries to the segmental level. No evidence of pulmonary embolism. Mildly enlarged heart. No pericardial effusion. Mediastinum/Nodes: No enlarged mediastinal, hilar, or  axillary lymph nodes. Thyroid gland, trachea, and esophagus demonstrate no significant findings. Lungs/Pleura: Widespread ground-glass and confluent airspace consolidation throughout both lungs with lower lobe and dependent distribution, consistent with moderate to severe COVID-19 pneumonia. 7 mm solid soft tissue nodule in the right middle lobe, image 177/323. Upper Abdomen: No acute abnormality. Musculoskeletal: No chest wall abnormality. No acute or significant osseous findings. Review of the MIP images confirms the above findings. IMPRESSION: 1. No evidence of pulmonary embolus. 2. Widespread ground-glass and confluent airspace consolidation throughout both lungs with lower lobe and dependent distribution, consistent with moderate to severe COVID-19 pneumonia. 3. 7 mm solid soft tissue nodule in the right middle lobe. Non-contrast chest CT at 6-12 months is recommended. If the nodule is stable at time of repeat CT, then future CT at 18-24 months (from today's scan) is considered optional for low-risk patients, but is recommended for high-risk patients. This recommendation follows the consensus statement: Guidelines for Management of Incidental Pulmonary Nodules Detected on CT Images: From the Fleischner Society 2017; Radiology 2017; 284:228-243. 4. Mildly enlarged heart. Electronically Signed   By: Ted Mcalpine M.D.   On: 11/22/2019 12:41   US Venous Img Lower Bilateral (DVT)  Result Date: 11/24/2019 CLINICAL DATA:  Shortness of breath. EXAM: BILATERAL LOWER EXTREMITY VENOUS DOPPLER ULTRASOUND TECHNIQUE: Gray-scale sonography with compression, as well as color and duplex ultrasound, were performed to evaluate the deep venous system(s) from the level of the common femoral vein through the popliteal and proximal calf veins. COMPARISON:  None. FINDINGS: VENOUS Normal compressibility of the common femoral, superficial femoral, and popliteal veins, as well as the visualized calf veins. Visualized portions  of profunda femoral vein and great saphenous vein unremarkable. No filling defects to suggest DVT on grayscale or color Doppler imaging. Doppler waveforms show normal direction of venous flow, normal respiratory plasticity and response to augmentation. OTHER None. Limitations: none IMPRESSION: Negative. Electronically Signed   By: Katherine Mantle M.D.   On: 11/24/2019 15:35   DG Chest Port 1 View  Result Date: 11/30/2019 CLINICAL DATA:  Acute respiratory  failure, COVID EXAM: PORTABLE CHEST 1 VIEW COMPARISON:  11/27/2019 FINDINGS: Multifocal patchy opacities throughout the left lung. Additional mild patchy opacity inferiorly in the right upper lobe and at the right lung base. This appearance is mildly progressive. No pleural effusion or pneumothorax. The heart is normal in size. IMPRESSION: Multifocal pneumonia in this patient with known COVID, mildly progressive. Electronically Signed   By: Charline Bills M.D.   On: 11/30/2019 08:25   DG Chest Port 1 View  Result Date: 11/27/2019 CLINICAL DATA:  Hypoxia.  COVID-19 positive EXAM: PORTABLE CHEST 1 VIEW COMPARISON:  November 25, 2019 FINDINGS: Foci of airspace opacity scattered throughout the left lung as well as in the medial right base persist. New slight increased opacity in the right mid lung. Heart size and pulmonary vascular normal. No adenopathy. There is aortic atherosclerosis. No bone lesions. IMPRESSION: Multifocal airspace opacity consistent with atypical organism pneumonia. Similar changes on the left and right base regions compared to 2 days prior. Subtle new opacity right mid lung noted. Stable cardiac silhouette. Aortic Atherosclerosis (ICD10-I70.0). Electronically Signed   By: Bretta Bang III M.D.   On: 11/27/2019 09:50   DG Chest Port 1 View  Result Date: 11/25/2019 CLINICAL DATA:  Shortness of breath EXAM: PORTABLE CHEST 1 VIEW COMPARISON:  CT from 3 days ago FINDINGS: Atypical pneumonia with asymmetric involvement on the left.  Cardiomegaly. No visible effusion or pneumothorax. IMPRESSION: Atypical pneumonia without progression since study 6 days ago. Cardiomegaly Electronically Signed   By: Marnee Spring M.D.   On: 11/25/2019 06:55   DG Chest Portable 1 View  Result Date: 11-25-2019 CLINICAL DATA:  Hypoxia and fever EXAM: PORTABLE CHEST 1 VIEW COMPARISON:  None. FINDINGS: Patchy bilateral airspace disease. Low lung volumes. Prominent heart size, accentuated by low volume chest. No visible effusion or pneumothorax. IMPRESSION: Atypical pneumonia pattern with low lung volumes. Electronically Signed   By: Marnee Spring M.D.   On: November 25, 2019 08:48    Consults: Treatment Team:  Salena Saner, MD   Subjective:    Overnight Issues: Patient was doing well most of the day. Out of bed and although desating would recover after 2-4 minutes  Objective:  Vital signs for last 24 hours: Temp:  [97.7 F (36.5 C)-98.9 F (37.2 C)] 97.7 F (36.5 C) (09/13 2000) Pulse Rate:  [69-100] 98 (09/13 2135) Resp:  [22-35] 31 (09/13 2135) BP: (81-120)/(59-82) 120/67 (09/13 1600) SpO2:  [82 %-96 %] 85 % (09/13 2135) FiO2 (%):  [100 %] 100 % (09/13 1700)  Hemodynamic parameters for last 24 hours:    Intake/Output from previous day: 09/12 0701 - 09/13 0700 In: 520 [P.O.:520] Out: 730 [Urine:730]  Intake/Output this shift: Total I/O In: 150.1 [I.V.:150.1] Out: 425 [Urine:425]  Vent settings for last 24 hours: FiO2 (%):  [100 %] 100 %  Physical Exam:  Physical Exam physical examination is limited due to need for PPE/CAPR GENERAL: Well-developed well-nourished gentleman in no respiratory distress comfortable on high flow O2 and nonrebreather with mask. HEAD: Normocephalic, atraumatic.  EYES: Pupils equal, round, reactive to light.  No scleral icterus.  MOUTH: Oral mucosa moist NECK: Supple. No thyromegaly. Trachea midline. No JVD.  No adenopathy. PULMONARY: Good air entry bilaterally.  Coarse breath sounds, difficult  to appreciate any other adventitious sounds due to PPE CARDIOVASCULAR:Regular rate and rhythm noted on monitor.  ABDOMEN: Soft, nondistended, normoactive bowel sounds.  No hepatosplenomegaly.  Ventral hernia, currently reducible. MUSCULOSKELETAL: No joint deformity, no clubbing, no edema.  NEUROLOGIC: No  focal deficits.  Speech is fluent.   SKIN: Intact,warm,dry.  No rashes. PSYCH: Mood and behavior appear normal  Assessment/Plan:  Acute respiratory failure with hypoxia due to COVID-19 --Worsening hypoxia this evening despite being on 100% Optiflow and Non-rebreather. Patient denies worsening shortness of breath and does not appear to be in more distress. Attempted BiPap but patient refused. He states he is unable to prone due to his back and neck pain.  Continue supplemental oxygen to keep saturations between 86 to 90% --Stop IVFs that are running at 75cc/hr. Per RN patient is having good PO intake. Concern for risk of volume overload and his worsening oxygenation status. --Overall negative for hospitalization but positive 340cc in the past 24 hours. Cr mildly elevated at 1.18 --Lasix  IV x1 Titrate FiO2 down as tolerated Continue prone positioning as tolerates Continue use of incentive spirometry while awake Continue pulmonary toilet Patient continues to decline intubation. I called and spoke w/patients wife who confirmed that the patient does not want intubation even if it means he could die. I explained to her I was concerned because his oxygenation status is worsening and he is refusing BIPAP and proning. I explained we dont have a lot of other options but will do our best. She was upset about his hospital stay and why he is not receiving more Vitamin C. She did agree to text him and encouarge him to comply with rotating on and off BIPAP and attempt proning.   COVID-19 pneumonia Completed remdesivir Remains on methylprednisone Continue indomethacin to help with cough/intrapulmonary  shunting Care as mentioned above  Acute Kidney Injury --Cr 1.18 this AM  --Patient was on IVFs all day --Worsening oxygenation status and concern for volume overload therefore discontinued --  Lasix IV was given in effort to see if this would help w/respiratory status --BMP in AM --Avoid further nephrotoxins --Monitor I&Os  Transaminitis Likely due to COVID-19 Continues to improve  Physical deconditioning Weakness Thyroid function normal Per PT the patient will need SNF but per hospitalist progress note will be difficult to place due to no insurance and homeless?    LOS: 11 days    Amanda Cockayne ACNP-BC

## 2019-12-01 ENCOUNTER — Encounter: Payer: Self-pay | Admitting: Internal Medicine

## 2019-12-01 ENCOUNTER — Inpatient Hospital Stay: Payer: HRSA Program

## 2019-12-01 DIAGNOSIS — R0602 Shortness of breath: Secondary | ICD-10-CM

## 2019-12-01 DIAGNOSIS — R0902 Hypoxemia: Secondary | ICD-10-CM

## 2019-12-01 DIAGNOSIS — Z515 Encounter for palliative care: Secondary | ICD-10-CM

## 2019-12-01 DIAGNOSIS — R1084 Generalized abdominal pain: Secondary | ICD-10-CM

## 2019-12-01 DIAGNOSIS — Z7189 Other specified counseling: Secondary | ICD-10-CM

## 2019-12-01 LAB — CBC
HCT: 43.2 % (ref 39.0–52.0)
Hemoglobin: 15.4 g/dL (ref 13.0–17.0)
MCH: 29.4 pg (ref 26.0–34.0)
MCHC: 35.6 g/dL (ref 30.0–36.0)
MCV: 82.6 fL (ref 80.0–100.0)
Platelets: 237 10*3/uL (ref 150–400)
RBC: 5.23 MIL/uL (ref 4.22–5.81)
RDW: 14.6 % (ref 11.5–15.5)
WBC: 15.7 10*3/uL — ABNORMAL HIGH (ref 4.0–10.5)
nRBC: 0 % (ref 0.0–0.2)

## 2019-12-01 LAB — COMPREHENSIVE METABOLIC PANEL
ALT: 32 U/L (ref 0–44)
AST: 34 U/L (ref 15–41)
Albumin: 2.2 g/dL — ABNORMAL LOW (ref 3.5–5.0)
Alkaline Phosphatase: 82 U/L (ref 38–126)
Anion gap: 9 (ref 5–15)
BUN: 29 mg/dL — ABNORMAL HIGH (ref 8–23)
CO2: 25 mmol/L (ref 22–32)
Calcium: 8.4 mg/dL — ABNORMAL LOW (ref 8.9–10.3)
Chloride: 98 mmol/L (ref 98–111)
Creatinine, Ser: 0.95 mg/dL (ref 0.61–1.24)
GFR calc Af Amer: >60 mL/min (ref >60)
GFR calc non Af Amer: >60 mL/min (ref >60)
Glucose, Bld: 150 mg/dL — ABNORMAL HIGH (ref 70–99)
Potassium: 4.8 mmol/L (ref 3.5–5.1)
Sodium: 132 mmol/L — ABNORMAL LOW (ref 135–145)
Total Bilirubin: 1 mg/dL (ref 0.3–1.2)
Total Protein: 5.7 g/dL — ABNORMAL LOW (ref 6.5–8.1)

## 2019-12-01 LAB — PROCALCITONIN: Procalcitonin: 0.11 ng/mL

## 2019-12-01 MED ORDER — FENTANYL CITRATE (PF) 100 MCG/2ML IJ SOLN: 100.0000 ug | Freq: Once | INTRAMUSCULAR | Status: DC

## 2019-12-01 MED ORDER — DEXMEDETOMIDINE HCL IN NACL 400 MCG/100ML IV SOLN
0.4000 ug/kg/h | INTRAVENOUS | Status: DC
  Administered 2019-12-01: 0.6 ug/kg/h via INTRAVENOUS

## 2019-12-01 MED ORDER — ASCORBIC ACID 500 MG PO TABS
1000.0000 mg | ORAL_TABLET | Freq: Every day | ORAL | Status: DC
  Administered 2019-12-02: 1000 mg via ORAL
  Filled 2019-12-01: qty 2

## 2019-12-01 MED ORDER — ROCURONIUM BROMIDE 50 MG/5ML IV SOLN
INTRAVENOUS | Status: AC
  Filled 2019-12-01: qty 1

## 2019-12-01 MED ORDER — FENTANYL CITRATE (PF) 100 MCG/2ML IJ SOLN
INTRAMUSCULAR | Status: AC
  Filled 2019-12-01: qty 4

## 2019-12-01 MED ORDER — MORPHINE SULFATE (PF) 2 MG/ML IV SOLN
INTRAVENOUS | Status: AC
  Administered 2019-12-01: 2 mg via INTRAVENOUS
  Filled 2019-12-01: qty 1

## 2019-12-01 MED ORDER — ETOMIDATE 2 MG/ML IV SOLN: 20.0000 mg | Freq: Once | INTRAVENOUS | Status: DC

## 2019-12-01 MED ORDER — FENTANYL CITRATE (PF) 100 MCG/2ML IJ SOLN
INTRAMUSCULAR | Status: AC
  Administered 2019-12-01: 100 ug via INTRAVENOUS
  Filled 2019-12-01: qty 2

## 2019-12-01 MED ORDER — NOREPINEPHRINE 4 MG/250ML-% IV SOLN
INTRAVENOUS | Status: AC
  Filled 2019-12-01: qty 250

## 2019-12-01 MED ORDER — ETOMIDATE 2 MG/ML IV SOLN
INTRAVENOUS | Status: AC
  Filled 2019-12-01: qty 10

## 2019-12-01 MED ORDER — SODIUM CHLORIDE 0.9 % IV SOLN
1.0000 g | INTRAVENOUS | Status: DC
  Administered 2019-12-01 – 2019-12-06 (×6): 1 g via INTRAVENOUS
  Filled 2019-12-01: qty 1
  Filled 2019-12-01: qty 10
  Filled 2019-12-01 (×4): qty 1
  Filled 2019-12-01: qty 10

## 2019-12-01 MED ORDER — SODIUM CHLORIDE 0.9 % IV SOLN
500.0000 mg | INTRAVENOUS | Status: DC
  Administered 2019-12-01 – 2019-12-06 (×6): 500 mg via INTRAVENOUS
  Filled 2019-12-01 (×8): qty 500

## 2019-12-01 MED ORDER — ROCURONIUM BROMIDE 50 MG/5ML IV SOLN: 50.0000 mg | Freq: Once | INTRAVENOUS | Status: DC

## 2019-12-01 NOTE — Progress Notes (Signed)
Patient refused to prone or move around much. Patient checked every hour and prn but stated he was not short of breath or upset. Permission given to speak with niece about his care. Truthfully spoke about how serious his condition is. Family wants him intubated or given a "magic" antibiotic to cure him. Explained there is only supportive therapy and patient refuses to prone and request to be a DNI. Patient is alert and oriented so his DNI order stands. At 1800 patient tried to prone and was unsuccessful with oxygen saturations in the 50s. He quickly turned back to supine. Oxygen saturations high 60s to80s only.

## 2019-12-01 NOTE — Progress Notes (Signed)
Patient sitting in chair at the start of the shift; transferred to bed and patient desating to 70's with HHF at 100% fi02 and 100% non-breather. After 30 minutes, patient continued to be in the low 70's. NP notified; IV Lasix given and patient put on bipap. Patient tolerated bipap for minimum of 15 min and then requested to come off. Patient and wife were advised that if patient comes off bipap he would desat; per wife she would rather him be comfortable on previous oxygenation method, patient was agreeable to this and bipap was removed. Patient placed back on on HHF and NRB for the rest of the night with no improvement in oxygenation and 02 SATs in low 80's. Wife updated by NP during the shift regarding plan of care.

## 2019-12-01 NOTE — Progress Notes (Signed)
PROGRESS NOTE    Saw Mendenhall  JQB:[id number] DOB: 07/01/1960 DOA: 12/01/2019 PCP: Patient, No Pcp Per   Brief Narrative: Taken from H&P Garnet Overfield is a 59 y.o. male with medical history significant for chronic neck and back pain who presents to the emergency room for evaluation of abdominal pain mostly around the periumbilical area. Found to be hypoxic and positive for COVID-19.  Patient is unvaccinated and denies any sick contacts.  Chest x-ray with bilateral infiltrates consistent with COVID-19 pneumonia.  Completed a course of remdesivir.  Continue to require higher level of oxygen.  Subjective: Per patient he has no new complaints and he is feeling okay. Continue to text on his phone. He was saturating in low 80s on maximum setting of heated HFNC along with non rebreather. Per nursing staff he was desaturating in low 70s and was placed on BiPAP overnight which he was unable to tolerate. Patient is refusing to prone. They talked with his wife and she was upset with the hospital staff that we are not helping him. At this time patient is refusing proning, BiPAP and intubation.  Assessment & Plan:   Principal Problem:   Pneumonia due to COVID-19 virus Active Problems:   Acute respiratory failure due to COVID-19 [facility])   Incarcerated ventral hernia   Transaminitis   Hyponatremia   Elevated LFTs   Insomnia   Impaired fasting glucose   Weakness  Acute hypoxic respiratory failure secondary to COVID-19 infection. Patient remained hypoxic despite being on maximum setting of heated HFNC along with nonrebreather. Refusing proning, BiPAP and intubation. Completed a course of remdesivir.  Also received  ivermectin.  Repeated chest x-ray  with stable atypical pneumonia and a subtle new opacity in right midlung noted.  Baricitinib was discontinued due to worsening leukocytosis.  Procalcitonin remained negative. -Continue Solu-Medrol. -Aggressive pulmonary toileting. -Chest  PT. -Continue supportive care and supplements. -Encourage proning-patient is refusing at this time. -Pulmonary was consulted-appreciate their help. -Palliative care was consulted over the weekend-has not seen him yet. As patient is refusing higher level of care there is nothing much to offer. He is high risk for deterioration and death. We will ask palliative to expedite their consult. Difficult situation as wife continue to blame hospital staff that we are not helping him at the same time also refusing intubation.  Leukocytosis. Worsening leukocytosis. Patient remained afebrile. Can be secondary to steroid. MRSA PCR negative. -Recheck procalcitonin. -Start him on ceftriaxone and Zithromax.  Transaminitis.  Most likely secondary to COVID-19 infection.  Liver enzymes improved. -Continue to monitor.  Physical deconditioning.  PT is recommending SNF placement.  Patient will be a difficult for disposition as he is uninsured.  Currently homeless. -Continue with PT while in the hospital. -TOC to look for disposition once some improvement in his oxygen requirement.  Currently requiring a lot of oxygen which is not safe for discharge.  Objective: Vitals:   12/01/19 0400 12/01/19 0500 12/01/19 0800 12/01/19 1000  BP: 115/75 101/74 104/66 124/68  Pulse: 77 89 85   Resp:   (!) 23   Temp:   98.7 F (37.1 C)   TempSrc:   Axillary   SpO2: (!) 79% (!) 84% (!) 83%   Weight:      Height:        Intake/Output Summary (Last 24 hours) at 12/01/2019 1221 Last data filed at 12/01/2019 0800 Gross per 24 hour  Intake 815.46 ml  Output 2725 ml  Net -1909.54 ml   [employer]  11/27/2019 0747 11/27/19 0443 11/27/19 1237  Weight: 104.3 kg 88.4 kg 88.4 kg    Examination:  General. Chronically ill-appearing gentleman, continuously texting on phone, in no acute distress. Pulmonary.  Lungs clear bilaterally, normal respiratory effort. CV.  Regular rate and rhythm, no JVD, rub or murmur. Abdomen.   Soft, nontender, nondistended, BS positive. CNS.  Alert and oriented x3.  No focal neurologic deficit. Extremities.  No edema, no cyanosis, pulses intact and symmetrical. Psychiatry.  Judgment and insight appears normal.  DVT prophylaxis: Lovenox Code Status: Full Family Communication: Nursing staff will communicate with his wife. Disposition Plan:  Status is: Inpatient  Remains inpatient appropriate because:Inpatient level of care appropriate due to severity of illness   Dispo: The patient is from: Home              Anticipated d/c is to: Home              Anticipated d/c date is: 3 days              Patient currently is not medically stable to d/c.  Patient is currently on heated HFNC, 55 L 100% FiO2 along with nonrebreather.  He is high risk for deterioration and death.  CODE STATUS changed to partial with no intubation according to his wishes.  Consultants:   Pulmonary  Palliative care  Procedures:  Antimicrobials:   Data Reviewed: I have personally reviewed following labs and imaging studies  CBC: Recent Labs  Lab 11/27/19 0447 11/28/19 0634 11/29/19 0413 11/30/19 0502 12/01/19 0437  WBC 12.3* 12.0* 11.6* 13.7* 15.7*  HGB 15.9 16.6 16.0 16.4 15.4  HCT 43.8 48.3 45.6 45.5 43.2  MCV 83.1 84.7 82.8 82.6 82.6  PLT 257 272 280 263 237   Basic Metabolic Panel: Recent Labs  Lab 11/27/19 0447 11/28/19 0634 11/29/19 0413 11/30/19 0502 12/01/19 0437  NA 131* 132* 132* 132* 132*  K 4.6 4.4 4.5 4.3 4.8  CL 97* 94* 97* 95* 98  CO2 24 26 26 27 25   GLUCOSE 132* 132* 141* 89 150*  BUN 23 36* 36* 45* 29*  CREATININE 0.80 0.96 0.80 1.18 0.95  CALCIUM 8.1* 8.5* 8.3* 8.4* 8.4*  MG  --  2.7*  --   --   --   PHOS  --  4.8*  --   --   --    GFR: Estimated Creatinine Clearance: 87 mL/min (by C-G formula based on SCr of 0.95 mg/dL). Liver Function Tests: Recent Labs  Lab 11/27/19 0447 11/28/19 0634 11/29/19 0413 11/30/19 0502 12/01/19 0437  AST 27 23 20 25  34   ALT 36 36 32 29 32  ALKPHOS 85 88 80 81 82  BILITOT 1.3* 1.4* 1.2 1.3* 1.0  PROT 6.0* 6.5 5.9* 6.0* 5.7*  ALBUMIN 2.4* 2.7* 2.4* 2.5* 2.2*   No results for input(s): LIPASE, AMYLASE in the last 168 hours. No results for input(s): AMMONIA in the last 168 hours. Coagulation Profile: No results for input(s): INR, PROTIME in the last 168 hours. Cardiac Enzymes: No results for input(s): CKTOTAL, CKMB, CKMBINDEX, TROPONINI in the last 168 hours. BNP (last 3 results) No results for input(s): PROBNP in the last 8760 hours. HbA1C: No results for input(s): HGBA1C in the last 72 hours. CBG: Recent Labs  Lab 11/27/19 1214  GLUCAP 150*   Lipid Profile: No results for input(s): CHOL, HDL, LDLCALC, TRIG, CHOLHDL, LDLDIRECT in the last 72 hours. Thyroid Function Tests: No results for input(s): TSH, T4TOTAL, FREET4, T3FREE, THYROIDAB in  the last 72 hours. Anemia Panel: No results for input(s): VITAMINB12, FOLATE, FERRITIN, TIBC, IRON, RETICCTPCT in the last 72 hours. Sepsis Labs: Recent Labs  Lab 11/25/19 0945 11/28/19 0635 11/29/19 0413 11/30/19 0502  PROCALCITON <0.10 <0.10 <0.10 <0.10    Recent Results (from the past 240 hour(s))  MRSA PCR Screening     Status: None   Collection Time: 11/27/19 12:31 PM   Specimen: Nasal Mucosa; Nasopharyngeal  Result Value Ref Range Status   MRSA by PCR NEGATIVE NEGATIVE Final    Comment:        The GeneXpert MRSA Assay (FDA approved for NASAL specimens only), is one component of a comprehensive MRSA colonization surveillance program. It is not intended to diagnose MRSA infection nor to guide or monitor treatment for MRSA infections. Performed at [facility], [address], [place], [place] [address]      Radiology Studies: [facility] Chest [facility] 1 View  Result Date: 12/01/2019 CLINICAL DATA:  Acute respiratory failure. EXAM: PORTABLE CHEST 1 VIEW COMPARISON:  Chest x-ray 11/30/2019 FINDINGS: Stable cardiac enlargement and  prominent mediastinal and hilar contours. Persistent diffuse pulmonary infiltrates with dense airspace consolidation in the left lower lobe. Probable small left effusion. IMPRESSION: Persistent diffuse pulmonary infiltrates left greater than right. Electronically Signed   By: Rudie Meyer M.D.   On: 12/01/2019 06:11   DG Chest Port 1 View  Result Date: 11/30/2019 CLINICAL DATA:  Acute respiratory failure, COVID EXAM: PORTABLE CHEST 1 VIEW COMPARISON:  11/27/2019 FINDINGS: Multifocal patchy opacities throughout the left lung. Additional mild patchy opacity inferiorly in the right upper lobe and at the right lung base. This appearance is mildly progressive. No pleural effusion or pneumothorax. The heart is normal in size. IMPRESSION: Multifocal pneumonia in this patient with known COVID, mildly progressive. Electronically Signed   By: Charline Bills M.D.   On: 11/30/2019 08:25    Scheduled Meds: . albuterol  2 puff Inhalation Q6H  . vitamin C  500 mg Oral Daily  . Chlorhexidine Gluconate Cloth  6 each Topical Daily  . dextromethorphan-guaiFENesin  1 tablet Oral BID  . enoxaparin (LOVENOX) injection  40 mg Subcutaneous Q24H  . famotidine  20 mg Oral BID  . fluticasone  1 spray Each Nare Daily  . indomethacin  50 mg Oral TID WC  . methylPREDNISolone (SOLU-MEDROL) injection  20 mg Intravenous Q12H  . sodium chloride flush  3 mL Intravenous Q12H  . traZODone  100 mg Oral QHS  . zinc sulfate  220 mg Oral Daily   Continuous Infusions: . sodium chloride Stopped (11/23/19 1030)  . sodium chloride Stopped (11/30/19 2221)     LOS: 12 days   Time spent: 30 minutes.  Arnetha Courser, MD [facility]  If 7PM-7AM, please contact night-coverage [email or web address]  12/01/2019, 12:21 PM   This record has been created using Conservation officer, historic buildings. Errors have been sought and corrected,but may not always be located. Such creation errors do not reflect on the standard of care.

## 2019-12-01 NOTE — Progress Notes (Signed)
1000 Per patient request I talked with niece about prognosis if patient not intubated. After discussing medications etc. I did explain what his oxygen saturations (low) and how patient refused to prone or lay on his side. Even after detailed instructions patient understood what he needed to do. Family wants to convince patient to be intubated. Repeatedly patient states he is not afraid nor does he question his treatment but family insist he constantly tells then this. Patient was ask if he is afraid and he states no although all  family states he is. Patient states he understands what he is ask. Patient is constantly texting and states he is not short of breath.

## 2019-12-01 NOTE — Progress Notes (Signed)
CH made visit as follow-up from encounter when pt. was on 1C last week; CH has been aware of pt.'s transition from 1C to 2A to ICU but had not been able to visit until tonight; when [facility] arrived, pt. awake, using smartphone, wearing BiPap.  CH entered rm. and began talking w/pt.; pt. found to be agitated and panicked, pointing to his mask and indicating that he wants to take it off; [facility] got paper and pen to help pt. express his concerns and he reiterated desire to be rid of mask. ICU NP also visited at this time; [facility] provide prayer for divine presence, peace and calm while waiting for medical team to determine best course of action; decision made to place pt. back on high-flow and non-rebreather mask; pt. immediately at ease --> very grateful for [facility] visit and to be off BiPap.  Pt. says he is worried about his perceived lack of progress; family also reportedly worried.  CH encouraged pt. to try to rest and avoid obsessive worrying if possible to promote healing.  Ch remains available as needed.

## 2019-12-01 NOTE — Consult Note (Signed)
Consultation Note Date: 12/01/2019   Patient Name: Rick Lowery  DOB: [birth year]  MRN: [id number]  Age / Sex: 59 y.o., male   PCP: Patient, No Pcp Per Referring Physician: Lorella Nimrod, MD   REASON FOR CONSULTATION:Establishing goals of care  Palliative Care consult requested for goals of care discussion in this 59 y.o. male with  medical problems including chronic neck and back pain.  He presented to the ED with complaints of abdominal pain, sudden onset of nausea and vomiting.  During his ED visit patient had noticeable hypoxia with oxygen saturations 82%.  Covid PCR test resulted positive.  Chest x-ray showed bilateral opacities in both lungs.  Patient also underwent reduction of incarcerated ventral hernia during ED work-up.  Since admission patient continues to require high volumes of high flow nasal cannula and nonrebreather for respiratory support.  He has completed remdesivir.  Clinical Assessment and Goals of Care: I have reviewed medical records including lab results, imaging, Epic notes, and MAR, received report from the bedside RN, and assessed the patient. I met at the bedside with Mr. Rick Lowery to discuss diagnosis prognosis, GOC, EOL wishes, disposition and options.  I introduced Palliative Medicine as specialized medical care for people living with serious illness. It focuses on providing relief from the symptoms and stress of a serious illness. The goal is to improve quality of life for both the patient and the family.  Patient verbalized understanding and appreciation.  Patient remains on 55 L heated HFNC in addition to nonrebreather.  He denies discomfort or pain.  Oxygenation continues to be poor despite aggressive interventions with monitor readings at 73% at rest.  We discussed a brief life review of the patient, along with his functional and nutritional status. Mr. Rick Lowery reports he lives in the home with his wife and 5 children (ages 26-30).  He reports generally  they do not all live together however during Covid family chose to move in and support 1 another.  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  He is originally from [place] and moved to the [place] over 30+ years ago.  Patient reports prior to admission he was in "excellent health".  He reports he is uncertain how he contracted Covid as his entire family remains negative and they are all unvaccinated.  Patient reports ability to perform all ADLs independently including several mile walks a day several days a week.  We discussed His current illness and what it means in the larger context of His on-going co-morbidities. With specific discussions regarding poor oxygenation, pneumonia, and overall functional state in the setting of COVID-19.  Natural disease trajectory and expectations at EOL were discussed.  Mr. Rick Lowery his understanding of his current illness.  He continues to state he is unsure how he contracted the virus.  Continues to express his excellent perception of health prior to admission.    I attempted to elicit values and goals of care important to the patient.    The difference between aggressive medical intervention and comfort care was considered in light of the patient's goals of care.  Mr. Rick Lowery is clear in his expressed wishes to continue treating the treatable aggressively despite lengthy discussion regarding poor prognosis and inability to maintain appropriate oxygenation despite use of high oxygen volumes.  Patient reports he is feeling fine and is short of breath at certain times with exertion but not while resting.  He is clear in his wishes he would not want to be  placed on any forms of life support (intubation/ventilation).  Space and opportunity created for patient to further discuss wishes.  He is tearful and expressing he had a close friend who was placed on life support and his family puts him through the traumatic experience of aggressive care such as  tracheostomy and continued ventilation only for his friend who passed away which seems to be in a miserable state.  Patient reports he would not want this for himself at no given point.   Mr. Rick Lowery aware his condition remains critical with a high risk of sudden decompensation and death.  He remains hopeful for improvement/stability.  Request to watchfully wait and take his care day by day with continued aggressive interventions.  He is requesting additional medications to assist with his recovery.  Discussed at length he is receiving maximal levels of care at this point with no additional medications available to administer in the setting of Covid.  He understands he has received antiviral medication and this is only a one-time course which is not to be repeated regardless of disease process.  He verbalized understanding and appreciation.  Patient request wife to be updated.  Wife continues to be mistrusting of medical care with reasoning that patient may not be receiving appropriate treatment.  Patient expresses that he does understand his treatment and is appreciative of the care being received knowing the medical team is doing everything possible to allow him a fair chance of recovery however when care is maximized short of intubation the medical team cannot guarantee survival or recovery.  Advanced directives, concepts specific to code status, artifical feeding and hydration, and rehospitalization were considered and discussed. Patient does not have a documented advanced directive.   Mr. Rick Lowery verbalized wishes for full scope aggressive interventions with NO INTUBATION/VENTILATION/BiPAP.  He is currently a partial code as requested.  Questions and concerns were addressed. The family was encouraged to call with questions or concerns.  PMT will continue to support holistically.   SOCIAL HISTORY:     reports that he has never smoked. He has never used smokeless tobacco. He reports that he does not  drink alcohol and does not use drugs.  CODE STATUS: Limited code no intubation, ventilation, or BiPAP  ADVANCE DIRECTIVES: Primary Decision Maker: Patient is awake, alert and oriented x3.   SYMPTOM MANAGEMENT: Per attending  Palliative Prophylaxis:   Aspiration and Frequent Pain Assessment  PSYCHO-SOCIAL/SPIRITUAL:  Support System: Family  Desire for further Chaplaincy support: No  Additional Recommendations (Limitations, Scope, Preferences):  Full Scope Treatment, No Artificial Feeding and No intubation, BiPAP, ventilation  Education on hospice/palliative    PAST MEDICAL HISTORY: Past Medical History:  Diagnosis Date  . Chronic neck and back pain     ALLERGIES:  has No Known Allergies.   MEDICATIONS:  Current Facility-Administered Medications  Medication Dose Route Frequency Provider Last Rate Last Admin  . 0.9 %  sodium chloride infusion  250 mL Intravenous PRN Lorella Nimrod, MD   Stopped at 11/23/19 1030  . 0.9 %  sodium chloride infusion   Intravenous Continuous Lorella Nimrod, MD   Stopped at 11/30/19 2221  . acetaminophen (TYLENOL) tablet 650 mg  650 mg Oral Q6H PRN Lorella Nimrod, MD      . albuterol (VENTOLIN HFA) 108 (90 Base) MCG/ACT inhaler 2 puff  2 puff Inhalation Q6H Lorella Nimrod, MD   2 puff at 12/01/19 1005  . ascorbic acid (VITAMIN C) tablet 500 mg  500 mg Oral Daily Lorella Nimrod, MD  500 mg at 12/01/19 1007  . azithromycin (ZITHROMAX) 500 mg in sodium chloride 0.9 % 250 mL IVPB  500 mg Intravenous Q24H Lorella Nimrod, MD      . cefTRIAXone (ROCEPHIN) 1 g in sodium chloride 0.9 % 100 mL IVPB  1 g Intravenous Q24H Lorella Nimrod, MD      . Chlorhexidine Gluconate Cloth 2 % PADS 6 each  6 each Topical Daily Lorella Nimrod, MD   6 each at 12/01/19 1008  . dextromethorphan-guaiFENesin (MUCINEX DM) 30-600 MG per 12 hr tablet 1 tablet  1 tablet Oral BID Lorella Nimrod, MD   1 tablet at 12/01/19 1007  . enoxaparin (LOVENOX) injection 40 mg  40 mg Subcutaneous Q24H  Vira Blanco, RPH   40 mg at 11/30/19 2218  . famotidine (PEPCID) tablet 20 mg  20 mg Oral BID Tyler Pita, MD   20 mg at 12/01/19 1007  . fluticasone (FLONASE) 50 MCG/ACT nasal spray 1 spray  1 spray Each Nare Daily Lorella Nimrod, MD   1 spray at 12/01/19 1005  . guaiFENesin-dextromethorphan (ROBITUSSIN DM) 100-10 MG/5ML syrup 10 mL  10 mL Oral Q4H PRN Lorella Nimrod, MD   10 mL at 11/24/19 1620  . indomethacin (INDOCIN) capsule 50 mg  50 mg Oral TID WC Tyler Pita, MD   50 mg at 12/01/19 0816  . methylPREDNISolone sodium succinate (SOLU-MEDROL) 40 mg/mL injection 20 mg  20 mg Intravenous Q12H Flora Lipps, MD   20 mg at 12/01/19 1005  . ondansetron (ZOFRAN) tablet 4 mg  4 mg Oral Q6H PRN Lorella Nimrod, MD       Or  . ondansetron (ZOFRAN) injection 4 mg  4 mg Intravenous Q6H PRN Lorella Nimrod, MD      . sodium chloride (OCEAN) 0.65 % nasal spray 1 spray  1 spray Each Nare PRN Lorella Nimrod, MD      . sodium chloride flush (NS) 0.9 % injection 3 mL  3 mL Intravenous Q12H Lorella Nimrod, MD   3 mL at 12/01/19 1008  . sodium chloride flush (NS) 0.9 % injection 3 mL  3 mL Intravenous PRN Lorella Nimrod, MD      . traZODone (DESYREL) tablet 100 mg  100 mg Oral QHS Lorella Nimrod, MD   100 mg at 11/28/19 2032  . zinc sulfate capsule 220 mg  220 mg Oral Daily Lorella Nimrod, MD   220 mg at 12/01/19 1007    VITAL SIGNS: BP 124/68   Pulse 85   Temp 98.7 F (37.1 C) (Axillary)   Resp (!) 23   Ht _0  (1.803 m)   Wt 88.4 kg   SpO2 (!) 83%   BMI 27.18 kg/m  Filed Weights   12/09/2019 0747 11/27/19 0443 11/27/19 1237  Weight: 104.3 kg 88.4 kg 88.4 kg    Estimated body mass index is 27.18 kg/m as calculated from the following:   Height as of this encounter: _1  (1.803 m).   Weight as of this encounter: 88.4 kg.  LABS: CBC:    Component Value Date/Time   WBC 15.7 (H) 12/01/2019 0437   HGB 15.4 12/01/2019 0437   HCT 43.2 12/01/2019 0437   PLT 237 12/01/2019 0437    Comprehensive Metabolic Panel:    Component Value Date/Time   NA 132 (L) 12/01/2019 0437   K 4.8 12/01/2019 0437   BUN 29 (H) 12/01/2019 0437   CREATININE 0.95 12/01/2019 0437   ALBUMIN 2.2 (L) 12/01/2019 7322  Review of Systems  Respiratory: Positive for shortness of breath.   Neurological: Positive for weakness.  Unless otherwise noted, a complete review of systems is negative.  Physical Exam General: NAD, frail  Cardiovascular: regular rate and rhythm Pulmonary: HFNC/NRB Neurological: awake, alert and oriented x3, mood appropriate   Prognosis: Guarded-poor  Discharge Planning:  To Be Determined  Recommendations: . Partial code-as requested/confirmed by patient.  NO intubation/ventilation/BiPAP . Continue with current plan of care per medical team, full scope/aggressive medical interventions. . Patient continues to remain hopeful for improvement/stability despite extensive discussion regarding critical/poor prognosis.  Extensive discussion with patient regarding high risk of sudden decompensation/sudden death.  Patient verbalizes understanding but again expressing his strong expectations of improvement given his "excellent health" prior to Covid. . Patient's goals are clear for continued treatment and medical interventions. [name] Kitchen PMT will continue to support and follow. Please call team line with urgent needs.   Palliative Performance Scale:                 Patient expressed understanding and was in agreement with this plan.   Thank you for allowing the Palliative Medicine Team to assist in the care of this patient.  Time In:1120 Time Out: 1210 Time Total: 50 min.   Visit consisted of counseling and education dealing with the complex and emotionally intense issues of symptom management and palliative care in the setting of serious and potentially life-threatening illness.Greater than 50%  of this time was spent counseling and coordinating care related to the above  assessment and plan.  Signed by:  Alda Lea, AGPCNP-BC Palliative Medicine Team  Phone: [phone] Pager: [phone] Amion: Bjorn Pippin

## 2019-12-01 NOTE — Progress Notes (Signed)
I was called to bedside to evaluate patient. Patient with acute and sudden WOB and acute and severe Hypoxia, patient placed on BiPAP  Patient is NOW agreeable to be placed on MV support and to be placed on Ventilator, However, when the Wife was updated, She has stated taht she does NOT want patient to be intubated, The wife is GOING AGAINST PATIENTS WISHES.  I advised that patient;s wife to come to bedside to make final decision with patient.   The Wife Boneta Lucks has called admin Tomasa Rand to discuss issues.  At this point, Patient is suffering and suffocating, patient will need to be made comfort measures or will need to be intubated. WIfe and family to come to bedside ASAP.

## 2019-12-02 ENCOUNTER — Inpatient Hospital Stay: Payer: HRSA Program

## 2019-12-02 DIAGNOSIS — J9601 Acute respiratory failure with hypoxia: Secondary | ICD-10-CM

## 2019-12-02 DIAGNOSIS — J9600 Acute respiratory failure, unspecified whether with hypoxia or hypercapnia: Secondary | ICD-10-CM

## 2019-12-02 LAB — BLOOD GAS, ARTERIAL
Acid-Base Excess: 6.6 mmol/L — ABNORMAL HIGH (ref 0.0–2.0)
Bicarbonate: 39.9 mmol/L — ABNORMAL HIGH (ref 20.0–28.0)
FIO2: 100
MECHVT: 500 mL
O2 Saturation: 80.4 %
PEEP: 15 cmH2O
Patient temperature: 37
RATE: 20 resp/min
pCO2 arterial: 102 mmHg (ref 32.0–48.0)
pH, Arterial: 7.2 — ABNORMAL LOW (ref 7.350–7.450)
pO2, Arterial: 55 mmHg — ABNORMAL LOW (ref 83.0–108.0)

## 2019-12-02 LAB — CBC
HCT: 50.2 % (ref 39.0–52.0)
Hemoglobin: 16.9 g/dL (ref 13.0–17.0)
MCH: 29.3 pg (ref 26.0–34.0)
MCHC: 33.7 g/dL (ref 30.0–36.0)
MCV: 87.2 fL (ref 80.0–100.0)
Platelets: 252 10*3/uL (ref 150–400)
RBC: 5.76 MIL/uL (ref 4.22–5.81)
RDW: 15.4 % (ref 11.5–15.5)
WBC: 38.5 10*3/uL — ABNORMAL HIGH (ref 4.0–10.5)
nRBC: 0 % (ref 0.0–0.2)

## 2019-12-02 LAB — COMPREHENSIVE METABOLIC PANEL
ALT: 82 U/L — ABNORMAL HIGH (ref 0–44)
AST: 85 U/L — ABNORMAL HIGH (ref 15–41)
Albumin: 2.2 g/dL — ABNORMAL LOW (ref 3.5–5.0)
Alkaline Phosphatase: 129 U/L — ABNORMAL HIGH (ref 38–126)
Anion gap: 10 (ref 5–15)
BUN: 30 mg/dL — ABNORMAL HIGH (ref 8–23)
CO2: 35 mmol/L — ABNORMAL HIGH (ref 22–32)
Calcium: 8.1 mg/dL — ABNORMAL LOW (ref 8.9–10.3)
Chloride: 91 mmol/L — ABNORMAL LOW (ref 98–111)
Creatinine, Ser: 1.55 mg/dL — ABNORMAL HIGH (ref 0.61–1.24)
GFR calc Af Amer: 55 mL/min — ABNORMAL LOW (ref >60)
GFR calc non Af Amer: 48 mL/min — ABNORMAL LOW (ref >60)
Glucose, Bld: 219 mg/dL — ABNORMAL HIGH (ref 70–99)
Potassium: 5.1 mmol/L (ref 3.5–5.1)
Sodium: 136 mmol/L (ref 135–145)
Total Bilirubin: 0.8 mg/dL (ref 0.3–1.2)
Total Protein: 6 g/dL — ABNORMAL LOW (ref 6.5–8.1)

## 2019-12-02 LAB — GLUCOSE, CAPILLARY
Glucose-Capillary: 207 mg/dL — ABNORMAL HIGH (ref 70–99)
Glucose-Capillary: 192 mg/dL — ABNORMAL HIGH (ref 70–99)
Glucose-Capillary: 185 mg/dL — ABNORMAL HIGH (ref 70–99)
Glucose-Capillary: 163 mg/dL — ABNORMAL HIGH (ref 70–99)
Glucose-Capillary: 127 mg/dL — ABNORMAL HIGH (ref 70–99)

## 2019-12-02 LAB — MAGNESIUM: Magnesium: 2.3 mg/dL (ref 1.7–2.4)

## 2019-12-02 LAB — PROCALCITONIN: Procalcitonin: 2.35 ng/mL

## 2019-12-02 LAB — TRIGLYCERIDES: Triglycerides: 202 mg/dL — ABNORMAL HIGH (ref <150)

## 2019-12-02 MED ORDER — PHENYLEPHRINE HCL-NACL 10-0.9 MG/250ML-% IV SOLN
0.0000 ug/min | INTRAVENOUS | Status: DC
  Filled 2019-12-02: qty 250

## 2019-12-02 MED ORDER — GUAIFENESIN-DM 100-10 MG/5ML PO SYRP: 10.0000 mL | ORAL_SOLUTION | ORAL | Status: DC | PRN

## 2019-12-02 MED ORDER — FAMOTIDINE 20 MG PO TABS
20.0000 mg | ORAL_TABLET | Freq: Two times a day (BID) | ORAL | Status: DC
  Administered 2019-12-02 – 2019-12-20 (×37): 20 mg
  Filled 2019-12-02 (×37): qty 1

## 2019-12-02 MED ORDER — ONDANSETRON HCL 4 MG/2ML IJ SOLN: 4.0000 mg | Freq: Four times a day (QID) | INTRAMUSCULAR | Status: DC | PRN

## 2019-12-02 MED ORDER — INDOMETHACIN 50 MG PO CAPS
50.0000 mg | ORAL_CAPSULE | Freq: Three times a day (TID) | ORAL | Status: AC
  Administered 2019-12-03 (×3): 50 mg
  Filled 2019-12-02 (×5): qty 1

## 2019-12-02 MED ORDER — FENTANYL BOLUS VIA INFUSION
50.0000 ug | INTRAVENOUS | Status: DC | PRN
  Administered 2019-12-19: 50 ug via INTRAVENOUS
  Filled 2019-12-02: qty 50

## 2019-12-02 MED ORDER — DOCUSATE SODIUM 50 MG/5ML PO LIQD
100.0000 mg | Freq: Two times a day (BID) | ORAL | Status: DC
  Administered 2019-12-02 (×2): 100 mg via ORAL
  Filled 2019-12-02 (×2): qty 10

## 2019-12-02 MED ORDER — NOREPINEPHRINE 4 MG/250ML-% IV SOLN
0.0000 ug/min | INTRAVENOUS | Status: DC
  Administered 2019-12-02 (×2): 23 ug/min via INTRAVENOUS
  Filled 2019-12-02 (×2): qty 250

## 2019-12-02 MED ORDER — FENTANYL 2500MCG IN NS 250ML (10MCG/ML) PREMIX INFUSION
0.0000 ug/h | INTRAVENOUS | Status: DC
  Administered 2019-12-02: 100 ug/h via INTRAVENOUS
  Administered 2019-12-02 – 2019-12-03 (×3): 200 ug/h via INTRAVENOUS
  Administered 2019-12-04 (×2): 400 ug/h via INTRAVENOUS
  Administered 2019-12-04: 200 ug/h via INTRAVENOUS
  Administered 2019-12-05: 300 ug/h via INTRAVENOUS
  Administered 2019-12-05 (×3): 400 ug/h via INTRAVENOUS
  Administered 2019-12-06 – 2019-12-11 (×18): 300 ug/h via INTRAVENOUS
  Administered 2019-12-12 (×2): 400 ug/h via INTRAVENOUS
  Administered 2019-12-12: 50 ug/h via INTRAVENOUS
  Administered 2019-12-13 – 2019-12-14 (×3): 200 ug/h via INTRAVENOUS
  Administered 2019-12-15: 50 ug/h via INTRAVENOUS
  Administered 2019-12-15 – 2019-12-18 (×2): 200 ug/h via INTRAVENOUS
  Administered 2019-12-19: 400 ug/h via INTRAVENOUS
  Administered 2019-12-19 – 2019-12-20 (×2): 300 ug/h via INTRAVENOUS
  Administered 2019-12-20: 30 ug/h via INTRAVENOUS
  Administered 2019-12-20: 300 ug/h via INTRAVENOUS
  Administered 2019-12-21 (×2): 400 ug/h via INTRAVENOUS
  Filled 2019-12-02 (×44): qty 250

## 2019-12-02 MED ORDER — ACETAMINOPHEN 325 MG PO TABS: 650.0000 mg | ORAL_TABLET | Freq: Four times a day (QID) | ORAL | Status: DC | PRN

## 2019-12-02 MED ORDER — STERILE WATER FOR INJECTION IV SOLN
INTRAVENOUS | Status: DC
  Filled 2019-12-02 (×3): qty 850

## 2019-12-02 MED ORDER — INSULIN ASPART 100 UNIT/ML ~~LOC~~ SOLN
0.0000 [IU] | SUBCUTANEOUS | Status: DC
  Administered 2019-12-02 (×2): 3 [IU] via SUBCUTANEOUS
  Administered 2019-12-03 – 2019-12-04 (×9): 2 [IU] via SUBCUTANEOUS
  Administered 2019-12-05 (×3): 3 [IU] via SUBCUTANEOUS
  Administered 2019-12-05: 5 [IU] via SUBCUTANEOUS
  Administered 2019-12-05 – 2019-12-10 (×19): 2 [IU] via SUBCUTANEOUS
  Administered 2019-12-11: 3 [IU] via SUBCUTANEOUS
  Administered 2019-12-11 – 2019-12-12 (×5): 2 [IU] via SUBCUTANEOUS
  Administered 2019-12-12 – 2019-12-13 (×3): 3 [IU] via SUBCUTANEOUS
  Administered 2019-12-13: 2 [IU] via SUBCUTANEOUS
  Administered 2019-12-13: 3 [IU] via SUBCUTANEOUS
  Administered 2019-12-13 – 2019-12-16 (×13): 2 [IU] via SUBCUTANEOUS
  Administered 2019-12-17: 3 [IU] via SUBCUTANEOUS
  Administered 2019-12-17 – 2019-12-18 (×4): 2 [IU] via SUBCUTANEOUS
  Administered 2019-12-18: 3 [IU] via SUBCUTANEOUS
  Administered 2019-12-18 – 2019-12-19 (×6): 2 [IU] via SUBCUTANEOUS
  Administered 2019-12-20: 3 [IU] via SUBCUTANEOUS
  Administered 2019-12-20 – 2019-12-21 (×3): 2 [IU] via SUBCUTANEOUS
  Filled 2019-12-02 (×76): qty 1

## 2019-12-02 MED ORDER — INSULIN ASPART 100 UNIT/ML ~~LOC~~ SOLN
0.0000 [IU] | Freq: Three times a day (TID) | SUBCUTANEOUS | Status: DC
  Administered 2019-12-02: 3 [IU] via SUBCUTANEOUS
  Administered 2019-12-02: 5 [IU] via SUBCUTANEOUS
  Filled 2019-12-02 (×3): qty 1

## 2019-12-02 MED ORDER — ZINC SULFATE 220 (50 ZN) MG PO CAPS
220.0000 mg | ORAL_CAPSULE | Freq: Every day | ORAL | Status: DC
  Administered 2019-12-03 – 2019-12-20 (×18): 220 mg
  Filled 2019-12-02 (×16): qty 1

## 2019-12-02 MED ORDER — VASOPRESSIN 20 UNITS/100 ML INFUSION FOR SHOCK
0.0000 [IU]/min | INTRAVENOUS | Status: DC
  Administered 2019-12-02: 0.01 [IU]/min via INTRAVENOUS
  Administered 2019-12-02 – 2019-12-06 (×7): 0.03 [IU]/min via INTRAVENOUS
  Filled 2019-12-02 (×8): qty 100

## 2019-12-02 MED ORDER — HYDROCORTISONE NA SUCCINATE PF 100 MG IJ SOLR
50.0000 mg | Freq: Four times a day (QID) | INTRAMUSCULAR | Status: DC
  Administered 2019-12-02 – 2019-12-10 (×32): 50 mg via INTRAVENOUS
  Filled 2019-12-02 (×33): qty 2

## 2019-12-02 MED ORDER — ONDANSETRON HCL 4 MG PO TABS: 4.0000 mg | ORAL_TABLET | Freq: Four times a day (QID) | ORAL | Status: DC | PRN

## 2019-12-02 MED ORDER — VECURONIUM BROMIDE 10 MG IV SOLR
INTRAVENOUS | Status: AC
  Administered 2019-12-02: 10 mg
  Filled 2019-12-02: qty 20

## 2019-12-02 MED ORDER — ASCORBIC ACID 500 MG PO TABS
1000.0000 mg | ORAL_TABLET | Freq: Every day | ORAL | Status: DC
  Administered 2019-12-03 – 2019-12-20 (×18): 1000 mg
  Filled 2019-12-02 (×18): qty 2

## 2019-12-02 MED ORDER — SODIUM BICARBONATE 8.4 % IV SOLN
INTRAVENOUS | Status: AC
  Administered 2019-12-02: 50 meq
  Filled 2019-12-02: qty 50

## 2019-12-02 MED ORDER — FENTANYL 2500MCG IN NS 250ML (10MCG/ML) PREMIX INFUSION
INTRAVENOUS | Status: AC
  Administered 2019-12-02: 100 ug/h via INTRAVENOUS
  Filled 2019-12-02: qty 250

## 2019-12-02 MED ORDER — PROPOFOL 1000 MG/100ML IV EMUL
INTRAVENOUS | Status: AC
  Administered 2019-12-02: 20 ug/kg/min via INTRAVENOUS
  Filled 2019-12-02: qty 100

## 2019-12-02 MED ORDER — CHLORHEXIDINE GLUCONATE 0.12% ORAL RINSE (MEDLINE KIT)
15.0000 mL | Freq: Two times a day (BID) | OROMUCOSAL | Status: DC
  Administered 2019-12-02 – 2019-12-20 (×37): 15 mL via OROMUCOSAL

## 2019-12-02 MED ORDER — FENTANYL CITRATE (PF) 100 MCG/2ML IJ SOLN: 50.0000 ug | Freq: Once | INTRAMUSCULAR | Status: DC

## 2019-12-02 MED ORDER — SODIUM BICARBONATE 8.4 % IV SOLN
INTRAVENOUS | Status: AC
  Administered 2019-12-02: 50 meq
  Filled 2019-12-02: qty 100

## 2019-12-02 MED ORDER — POLYETHYLENE GLYCOL 3350 17 G PO PACK
17.0000 g | PACK | Freq: Every day | ORAL | Status: DC
  Administered 2019-12-02: 17 g via ORAL
  Filled 2019-12-02: qty 1

## 2019-12-02 MED ORDER — PROPOFOL 1000 MG/100ML IV EMUL
0.0000 ug/kg/min | INTRAVENOUS | Status: DC
  Administered 2019-12-02: 30 ug/kg/min via INTRAVENOUS
  Administered 2019-12-02 (×2): 40 ug/kg/min via INTRAVENOUS
  Administered 2019-12-02: 20 ug/kg/min via INTRAVENOUS
  Administered 2019-12-03 (×6): 40 ug/kg/min via INTRAVENOUS
  Administered 2019-12-04: 50 ug/kg/min via INTRAVENOUS
  Administered 2019-12-04: 40 ug/kg/min via INTRAVENOUS
  Administered 2019-12-04 (×3): 50 ug/kg/min via INTRAVENOUS
  Administered 2019-12-04 – 2019-12-05 (×2): 40 ug/kg/min via INTRAVENOUS
  Administered 2019-12-05: 50 ug/kg/min via INTRAVENOUS
  Administered 2019-12-05 (×2): 40 ug/kg/min via INTRAVENOUS
  Administered 2019-12-05 (×2): 50 ug/kg/min via INTRAVENOUS
  Administered 2019-12-06 (×2): 30 ug/kg/min via INTRAVENOUS
  Administered 2019-12-06 (×2): 40 ug/kg/min via INTRAVENOUS
  Administered 2019-12-07 (×2): 30 ug/kg/min via INTRAVENOUS
  Filled 2019-12-02 (×27): qty 100

## 2019-12-02 MED ORDER — VECURONIUM BROMIDE 10 MG IV SOLR
10.0000 mg | INTRAVENOUS | Status: DC | PRN
  Administered 2019-12-02 – 2019-12-12 (×15): 10 mg via INTRAVENOUS
  Filled 2019-12-02 (×16): qty 10

## 2019-12-02 MED ORDER — NOREPINEPHRINE 16 MG/250ML-% IV SOLN
0.0000 ug/min | INTRAVENOUS | Status: DC
  Administered 2019-12-02: 25 ug/min via INTRAVENOUS
  Administered 2019-12-02: 38 ug/min via INTRAVENOUS
  Administered 2019-12-03: 32 ug/min via INTRAVENOUS
  Administered 2019-12-03: 16 ug/min via INTRAVENOUS
  Administered 2019-12-04: 24 ug/min via INTRAVENOUS
  Administered 2019-12-04: 18 ug/min via INTRAVENOUS
  Administered 2019-12-05: 24 ug/min via INTRAVENOUS
  Administered 2019-12-05: 22 ug/min via INTRAVENOUS
  Administered 2019-12-06: 14 ug/min via INTRAVENOUS
  Administered 2019-12-06: 18 ug/min via INTRAVENOUS
  Filled 2019-12-02 (×9): qty 250

## 2019-12-02 MED ORDER — DOCUSATE SODIUM 50 MG/5ML PO LIQD
100.0000 mg | Freq: Two times a day (BID) | ORAL | Status: DC
  Administered 2019-12-02 – 2019-12-19 (×14): 100 mg
  Filled 2019-12-02 (×20): qty 10

## 2019-12-02 MED ORDER — MIDAZOLAM HCL 2 MG/2ML IJ SOLN
INTRAMUSCULAR | Status: AC
  Administered 2019-12-02: 4 mg
  Filled 2019-12-02: qty 4

## 2019-12-02 MED ORDER — SODIUM CHLORIDE 0.9 % IV SOLN
0.0000 ug/min | INTRAVENOUS | Status: DC
  Filled 2019-12-02: qty 1

## 2019-12-02 MED ORDER — TRAZODONE HCL 100 MG PO TABS
100.0000 mg | ORAL_TABLET | Freq: Every day | ORAL | Status: DC
  Administered 2019-12-12 – 2019-12-17 (×6): 100 mg
  Filled 2019-12-02 (×8): qty 1

## 2019-12-02 MED ORDER — ORAL CARE MOUTH RINSE
15.0000 mL | OROMUCOSAL | Status: DC
  Administered 2019-12-02 – 2019-12-21 (×184): 15 mL via OROMUCOSAL

## 2019-12-02 MED ORDER — POLYETHYLENE GLYCOL 3350 17 G PO PACK
17.0000 g | PACK | Freq: Every day | ORAL | Status: DC
  Administered 2019-12-03 – 2019-12-19 (×5): 17 g
  Filled 2019-12-02 (×6): qty 1

## 2019-12-02 NOTE — Progress Notes (Signed)
Patient cell phone & glasses located in the room cabinet under patients underwear & T-shirt. Belongings placed in a bag and given to charge nurse.

## 2019-12-02 NOTE — Procedures (Signed)
Arterial Line Placement: Indication: Frequent blood draws; Invasive BP monitoring.   Consent: Emergent.   Hand washing performed prior to starting the procedure.   Procedure: An active timeout was performed and correct patient, name, & ID confirmed. Physicial exam was performed to ensure adequate perfusion.  Using sterile technique, an aterial line was inserted into the LEFT  Femoral artery.  Catheter threaded and the needle was removed with appropriate blood return.  Arterial waveform was noted.  After the procedure, the patient's extremities were observed to be pink and warm.   Estimated Blood Loss: None .   Number of Attempts: 1.   Complications: None .  Operator: Malasha Kleppe.   Lucie Leather, M.D.  [name]  Medical Director [facility] Medical Director [facility] Cardio-Pulmonary Department

## 2019-12-02 NOTE — Progress Notes (Signed)
Patient O2 level decreased to the low 40s while on HFNC @ 100% & NRB. After the NP & MD had a discussion with the patients wife and the wife coming to the facility to talk with the patient, he was intubated without incident. He received Fentanyl 200 mg, Versed 4 mg & Vecuronium 20 mg. He received a 8.5 ET Tube/24 @ lip with vent setting at FIO2 100% Peep 15 Rate 20 & TV 500. Also a OG Tube, Left Femoral Arterial Line, Right Triple Lumen Central Catheter & 60F Foley placed without incident.

## 2019-12-02 NOTE — Procedures (Signed)
Endotracheal Intubation: Patient required placement of an artificial airway secondary to Respiratory Failure  Consent: Emergent.   Hand washing performed prior to starting the procedure.   Medications administered for sedation prior to procedure:  Midazolam 4 mg IV,  Vecuronium 10 mg IV, Fentanyl 100 mcg IV.    A time out procedure was called and correct patient, name, & ID confirmed. Needed supplies and equipment were assembled and checked to include ETT, 10 ml syringe, Glidescope, Mac and Miller blades, suction, oxygen and bag mask valve, end tidal CO2 monitor.   Patient was positioned to align the mouth and pharynx to facilitate visualization of the glottis.   Heart rate, SpO2 and blood pressure was continuously monitored during the procedure. Pre-oxygenation was conducted prior to intubation and endotracheal tube was placed through the vocal cords into the trachea.     The artificial airway was placed under direct visualization via glidescope route using a 8.5 ETT on the first attempt.  ETT was secured at 23 cm mark.  Placement was confirmed by auscuitation of lungs with good breath sounds bilaterally and no stomach sounds.  Condensation was noted on endotracheal tube.   Pulse ox 68%.  CO2 detector in place with appropriate color change.   Complications: None .   Operator: Kong Packett.   Chest radiograph ordered and pending.   Comments: OGT placed via glidescope.  Corrin Parker, M.D.  [facility]  Medical Director [place] Director [facility] Cardio-Pulmonary Department

## 2019-12-02 NOTE — Progress Notes (Signed)
GOALS OF CARE DISCUSSION  The Clinical status was relayed to family in detail. Wife at Bedside, Son Bedside  Updated and notified of patients medical condition.  patient with increased WOB and using accessory muscles to breathe Explained to family course of therapy and the modalities     Patient with Progressive multiorgan failure with probability of very low chance of meaningful recovery despite all aggressive and optimal medical therapy. Patient is in the Dying  Process associated with Suffering.  Family understands the situation.  They have consented and agreed to DNR Will continue medical management, Wife understands the grave situation   Family are satisfied with Plan of action and management. All questions answered  Additional CC time 32 mins   Amye Grego [name], M.D.  [name]  Medical Director [facility] Medical Director [facility] Cardio-Pulmonary Department

## 2019-12-02 NOTE — Progress Notes (Signed)
GOALS OF CARE DISCUSSION  The Clinical status was relayed to family in detail. Wife Rick Lowery updated over the phone  Updated and notified of patients medical condition. I explained in detail the severity of patients  critical illness   patient with increased WOB and using accessory muscles to breathe, patient in prone position and on multiple vasopressors Explained to family course of therapy and the modalities     Patient with Progressive multiorgan failure with very high probability of low chance of meaningful recovery despite all aggressive and optimal medical therapy and  associated with Suffering.  Wife Rick Lowery understands the situation.  Patient remains DNR status   Additional CC time 32 mins   [name] [name], M.D.  [name]  Medical Director [facility] Medical Director [facility] Cardio-Pulmonary Department

## 2019-12-02 NOTE — Progress Notes (Signed)
Initial Nutrition Assessment  DOCUMENTATION CODES:   Not applicable  INTERVENTION:  Once patient is stable enough for initiation of tube feeds recommend: -Initiate Vital 1.5 Cal at 15 mL/hr and advance by 15 mL/hr every 12 hours to goal rate of 45 mL/hr (1080 mL goal daily volume) per tube -PROsource 90 mL TID per tube -Goal regimen provides 1860 kcal, 139 grams of protein, 821 mL H2O daily. Provides 2211 kcal daily including current propofol rate.  Monitor magnesium, potassium, and phosphorus daily for at least 3 days, MD to replete as needed, as pt is at risk for refeeding syndrome.  NUTRITION DIAGNOSIS:   Inadequate oral intake related to inability to eat as evidenced by NPO status.  GOAL:   Patient will meet greater than or equal to 90% of their needs  MONITOR:   Vent status, Labs, Weight trends, I & O's  REASON FOR ASSESSMENT:   Ventilator    ASSESSMENT:   59 year old male with chronic neck and back pain presented to [facility] on 9/2 for incarcerated ventral hernia and was found to have COVID-19.   9/10 transferred to ICU 9/15 intubated  Patient is currently intubated on ventilator support MV: 12.5 L/min Temp (24hrs), Avg:97.3 F (36.3 C), Min:96 F (35.6 C), Max:98 F (36.7 C)  Propofol: 13.3 ml/hr (351 kcal daily)  Medications reviewed and include: vitamin C 1000 mg daily per tube, Colace 100 mg BID per tube, famotidine, Solu-Cortef 50 mg Q6hrs IV, Novolog 0-15 units TID, Miralax, zinc sulfate 220 mg daily per tube, azithromycin, ceftriaxone, fentanyl gtt, norepinephrine gtt at 23 mcg/min, propofol gtt, vasopressin gtt.  Labs reviewed: CBG 192-207, Chloride 91, CO2 35, BUN 30, Creatinine 1.55, Triglycerides 202.  Enteral Access: OGT placed 9/15; per abdominal x-ray side port is proximal to GE junction and advancement by 7 cm is recommended  I/O: 2025 mL UOP yesterday (1 mL/kg/hr)  Weight trend: 88.4 kg on 9/10  Per review of chart PO intake was variable  prior to intubation. Mainly 50-75% of meals.  Discussed with RN and on rounds. Plan is to hold off on tube feeds today.  NUTRITION - FOCUSED PHYSICAL EXAM:  Deferred.  Diet Order:   Diet Order            Diet NPO time specified  Diet effective now                EDUCATION NEEDS:   No education needs have been identified at this time  Skin:  Skin Assessment: Reviewed RN Assessment  Last BM:  11/30/2019  Height:   Ht Readings from Last 1 Encounters:  12/02/19 5' 10.98" (1.803 m)   Weight:   Wt Readings from Last 1 Encounters:  11/27/19 88.4 kg   Ideal Body Weight:  78.2 kg  BMI:  Body mass index is 27.19 kg/m.  Estimated Nutritional Needs:   Kcal:  2298  Protein:  135-145 grams  Fluid:  >/= 2.3 L/day  Felix Pacini, MS, RD, LDN Pager number available on Amion

## 2019-12-02 NOTE — Procedures (Signed)
Central Venous Catheter Insertion Procedure Note  Rick Lowery  [id number]  September 15, 1960  Date:12/02/19  Time:12:51 AM   Provider Performing:Tachina Spoonemore Fayrene Fearing   Procedure: Insertion of Non-tunneled Central Venous Catheter(36556) with US guidance (16384)   Indication(s) Medication administration and Difficult access  Consent Unable to obtain consent due to emergent nature of procedure.  Anesthesia Topical only with 1% lidocaine   Timeout Verified patient identification, verified procedure, site/side was marked, verified correct patient position, special equipment/implants available, medications/allergies/relevant history reviewed, required imaging and test results available.  Sterile Technique Maximal sterile technique including full sterile barrier drape, hand hygiene, sterile gown, sterile gloves, mask, hair covering, sterile ultrasound probe cover (if used).  Procedure Description Area of catheter insertion was cleaned with chlorhexidine and draped in sterile fashion.  With real-time ultrasound guidance a central venous catheter was placed into the right internal jugular vein. Nonpulsatile blood flow and easy flushing noted in all ports.  The catheter was sutured in place and sterile dressing applied.  Complications/Tolerance None; patient tolerated the procedure well. Chest X-ray is ordered to verify placement for internal jugular or subclavian cannulation.   Chest x-ray is not ordered for femoral cannulation.  EBL Minimal  Specimen(s) None

## 2019-12-02 NOTE — Progress Notes (Signed)
CRITICAL CARE NOTE  59 y.o. male w/chronic neck and back pain who presented to [facility] 11/28/2019 for issues with an incarcerated ventral hernia. In the process of being evaluated the patient was noted to by hypoxic and tested postive for COVID-19. Patient was admitted for management of acute respiratory failure with hypoxia due to COVID-19 pneumonia. Patient transferred to the ICU/stepdown on 27 November 2019 due to increasing FiO2 requirements.  9/14 progressive resp failure, patient decided to be placed on VENT 9/15 patient emergently intubated, RT CVL placed, LT FEM ART PLACED   CC  follow up respiratory failure  SUBJECTIVE Patient remains critically ill Prognosis is guarded Severe hypoxia proned position   BP 91/71   Pulse (!) 106   Temp (!) 96 F (35.6 C) (Axillary)   Resp (!) 23   Ht 5' 10.98" (1.803 m)   Wt 88.4 kg   SpO2 90%   BMI 27.19 kg/m    I/O last 3 completed shifts: In: 2261.6 [P.O.:900; I.V.:961.6; IV Piggyback:400] Out: 3850 [Urine:3850] Total I/O In: 298.2 [I.V.:298.2] Out: 30 [Urine:30]  SpO2: 90 % O2 Flow Rate (L/min): 55 L/min FiO2 (%): 100 %  Estimated body mass index is 27.19 kg/m as calculated from the following:   Height as of this encounter: 5' 10.98" (1.803 m).   Weight as of this encounter: 88.4 kg.  SIGNIFICANT EVENTS   REVIEW OF SYSTEMS  PATIENT IS UNABLE TO PROVIDE COMPLETE REVIEW OF SYSTEMS DUE TO SEVERE CRITICAL ILLNESS      COVID-19 DISASTER DECLARATION:   FULL CONTACT PHYSICAL EXAMINATION WAS NOT POSSIBLE DUE TO TREATMENT OF COVID-19  AND CONSERVATION OF PERSONAL PROTECTIVE EQUIPMENT, LIMITED EXAM FINDINGS INCLUDE-   PHYSICAL EXAMINATION:  GENERAL:critically ill appearing, +resp distress NEUROLOGIC: obtunded, GCS<8   Patient assessed or the symptoms described in the history of present illness.  In the context of the Global COVID-19 pandemic, which necessitated consideration that the patient might be at risk for  infection with the SARS-CoV-2 virus that causes COVID-19, Institutional protocols and algorithms that pertain to the evaluation of patients at risk for COVID-19 are in a state of rapid change based on information released by regulatory bodies including the CDC and federal and state organizations. These policies and algorithms were followed during the patient's care while in hospital.    MEDICATIONS: I have reviewed all medications and confirmed regimen as documented   CULTURE RESULTS   Recent Results (from the past 240 hour(s))  MRSA PCR Screening     Status: None   Collection Time: 11/27/19 12:31 PM   Specimen: Nasal Mucosa; Nasopharyngeal  Result Value Ref Range Status   MRSA by PCR NEGATIVE NEGATIVE Final    Comment:        The GeneXpert MRSA Assay (FDA approved for NASAL specimens only), is one component of a comprehensive MRSA colonization surveillance program. It is not intended to diagnose MRSA infection nor to guide or monitor treatment for MRSA infections. Performed at [facility], [address] [place], [place] [address]           IMAGING    DG Abd 1 View  Result Date: 12/02/2019 CLINICAL DATA:  Orogastric tube repositioning EXAM: ABDOMEN - 1 VIEW COMPARISON:  12/02/2019 at 12:50 a.m. FINDINGS: Orogastric tube tip is in the stomach, but the side port is slightly proximal to the gastroesophageal junction. Recommend advancing by 7 cm. IMPRESSION: Orogastric tube tip in the stomach, but side port slightly proximal to the gastroesophageal junction. Recommend advancing by 7  cm. Electronically Signed   By: Deatra Robinson M.D.   On: 12/02/2019 01:08   DG Abd 1 View  Result Date: 12/02/2019 CLINICAL DATA:  Orogastric tube placement EXAM: ABDOMEN - 1 VIEW COMPARISON:  None. FINDINGS: No orogastric tube is visible. A follow-up radiograph has been obtained at the time of this dictation. IMPRESSION: No orogastric tube visible. Electronically Signed   By: Deatra Robinson M.D.   On: 12/02/2019 01:07   DG Chest Port 1 View  Result Date: 12/02/2019 CLINICAL DATA:  Endotracheal tube position EXAM: PORTABLE CHEST 1 VIEW COMPARISON:  None. FINDINGS: Support Apparatus: --Endotracheal tube: Tip at the level of the clavicular heads. --Enteric tube:Orogastric tube tip is also near the level of the clavicular heads. At the time of this dictation, a follow-up radiograph has been obtained. --Catheter(s):Right IJ approach central venous catheter tip is in the lower SVC. --Other: None There are bilateral, left-greater-than-right airspace opacities. Cardiomediastinal contours are normal. No sizable pleural effusion. IMPRESSION: 1. Endotracheal tube tip at the level of the clavicular heads. 2. Orogastric tube tip near the level of the clavicular heads. Follow-up radiograph has been obtained at the time of this dictation. Electronically Signed   By: Deatra Robinson M.D.   On: 12/02/2019 01:06     Nutrition Status:       BMP Latest Ref Rng & Units 12/02/2019 12/01/2019 11/30/2019  Glucose 70 - 99 mg/dL 829(F) 621(H) 89  BUN 8 - 23 mg/dL 08(M) 57(Q) 46(N)  Creatinine 0.61 - 1.24 mg/dL 6.29(B) 2.84 1.32  Sodium 135 - 145 mmol/L 136 132(L) 132(L)  Potassium 3.5 - 5.1 mmol/L 5.1 4.8 4.3  Chloride 98 - 111 mmol/L 91(L) 98 95(L)  CO2 22 - 32 mmol/L 35(H) 25 27  Calcium 8.9 - 10.3 mg/dL 8.1(L) 8.4(L) 8.4(L)       Indwelling Urinary Catheter continued, requirement due to   Reason to continue Indwelling Urinary Catheter strict Intake/Output monitoring for hemodynamic instability   Central Line/ continued, requirement due to  Reason to continue [employer] Monitoring of central venous pressure or other hemodynamic parameters and poor IV access   Ventilator continued, requirement due to severe respiratory failure   Ventilator Sedation RASS 0 to -2      ASSESSMENT AND PLAN SYNOPSIS  Acute hypoxemic respiratory failure due to COVID-19 pneumonia / ARDS Mechanical  ventilation via ARDS protocol, target PRVC 6 cc/kg Wean PEEP and FiO2 as able Goal plateau pressure less than 30, driving pressure less than 15 Paralytics if necessary for vent synchrony, gas exchange Cycle prone positioning if necessary for oxygenation Deep sedation per PAD protocol, goal RASS -4, currently fentanyl, midazolam Diuresis as blood pressure and renal function can tolerate, goal CVP 5-8.   diuresis as tolerated based on Kidney function VAP prevention order set Remdesivir  IV STEROIDS  Follow inflammatory markers: Ferritin, D-dimer, CRP, IL-6, LDH Vitamin C, zinc Plan to repeat and check resp cultures    Severe ACUTE Hypoxic and Hypercapnic Respiratory Failure -continue Full MV support -continue Bronchodilator Therapy -Wean Fio2 and PEEP as tolerated -VAP/VENT bundle implementation  Morbid obesity, possible OSA.   Will certainly impact respiratory mechanics, ventilator weaning Suspect will need to consider additional PEEP   ACUTE KIDNEY INJURY/Renal Failure -continue Foley Catheter-assess need -Avoid nephrotoxic agents -Follow urine output, BMP -Ensure adequate renal perfusion, optimize oxygenation -Renal dose medications     NEUROLOGY Acute toxic metabolic encephalopathy, need for sedation Goal RASS -2 to -3  SHOCK-SEPSIS due to COVID 19  -use  vasopressors to keep MAP>65 -follow ABG and LA -follow up cultures -emperic ABX   CARDIAC ICU monitoring  ID -continue IV abx as prescibed -follow up cultures  GI GI PROPHYLAXIS as indicated   DIET-->TF's as tolerated Constipation protocol as indicated  ENDO - will use ICU hypoglycemic\Hyperglycemia protocol if indicated     ELECTROLYTES -follow labs as needed -replace as needed -pharmacy consultation and following   DVT/GI PRX ordered and assessed TRANSFUSIONS AS NEEDED MONITOR FSBS I Assessed the need for Labs I Assessed the need for Foley I Assessed the need for Central Venous  Line Family Discussion when available I Assessed the need for Mobilization I made an Assessment of medications to be adjusted accordingly Safety Risk assessment completed   CASE DISCUSSED IN MULTIDISCIPLINARY ROUNDS WITH ICU TEAM  Critical Care Time devoted to patient care services described in this note is 45 minutes.   Overall, patient is critically ill, prognosis is guarded.  Patient with Multiorgan failure and at high risk for cardiac arrest and death.    Lucie Leather, M.D.  [name]  Medical Director [facility] Medical Director [facility] Cardio-Pulmonary Department

## 2019-12-03 ENCOUNTER — Inpatient Hospital Stay: Payer: HRSA Program

## 2019-12-03 LAB — BASIC METABOLIC PANEL
Anion gap: 13 (ref 5–15)
BUN: 46 mg/dL — ABNORMAL HIGH (ref 8–23)
CO2: 31 mmol/L (ref 22–32)
Calcium: 8.3 mg/dL — ABNORMAL LOW (ref 8.9–10.3)
Chloride: 94 mmol/L — ABNORMAL LOW (ref 98–111)
Creatinine, Ser: 1.43 mg/dL — ABNORMAL HIGH (ref 0.61–1.24)
GFR calc Af Amer: >60 mL/min (ref >60)
GFR calc non Af Amer: 52 mL/min — ABNORMAL LOW (ref >60)
Glucose, Bld: 135 mg/dL — ABNORMAL HIGH (ref 70–99)
Potassium: 4.6 mmol/L (ref 3.5–5.1)
Sodium: 138 mmol/L (ref 135–145)

## 2019-12-03 LAB — CBC
HCT: 49 % (ref 39.0–52.0)
Hemoglobin: 15.9 g/dL (ref 13.0–17.0)
MCH: 29.3 pg (ref 26.0–34.0)
MCHC: 32.4 g/dL (ref 30.0–36.0)
MCV: 90.4 fL (ref 80.0–100.0)
Platelets: 194 10*3/uL (ref 150–400)
RBC: 5.42 MIL/uL (ref 4.22–5.81)
RDW: 15.8 % — ABNORMAL HIGH (ref 11.5–15.5)
WBC: 24.8 10*3/uL — ABNORMAL HIGH (ref 4.0–10.5)
nRBC: 0 % (ref 0.0–0.2)

## 2019-12-03 LAB — BLOOD GAS, ARTERIAL
Acid-Base Excess: 0.1 mmol/L (ref 0.0–2.0)
Bicarbonate: 30.4 mmol/L — ABNORMAL HIGH (ref 20.0–28.0)
FIO2: 100
MECHVT: 500 mL
O2 Saturation: 97.9 %
PEEP: 15 cmH2O
Patient temperature: 37
RATE: 26 resp/min
pCO2 arterial: 76 mmHg (ref 32.0–48.0)
pH, Arterial: 7.21 — ABNORMAL LOW (ref 7.350–7.450)
pO2, Arterial: 121 mmHg — ABNORMAL HIGH (ref 83.0–108.0)

## 2019-12-03 LAB — GLUCOSE, CAPILLARY
Glucose-Capillary: 118 mg/dL — ABNORMAL HIGH (ref 70–99)
Glucose-Capillary: 118 mg/dL — ABNORMAL HIGH (ref 70–99)
Glucose-Capillary: 119 mg/dL — ABNORMAL HIGH (ref 70–99)
Glucose-Capillary: 127 mg/dL — ABNORMAL HIGH (ref 70–99)
Glucose-Capillary: 128 mg/dL — ABNORMAL HIGH (ref 70–99)
Glucose-Capillary: 139 mg/dL — ABNORMAL HIGH (ref 70–99)

## 2019-12-03 LAB — TRIGLYCERIDES: Triglycerides: 234 mg/dL — ABNORMAL HIGH (ref <150)

## 2019-12-03 LAB — PROCALCITONIN: Procalcitonin: 2.33 ng/mL

## 2019-12-03 MED ORDER — CHLORHEXIDINE GLUCONATE 0.12 % MT SOLN
OROMUCOSAL | Status: AC
  Administered 2019-12-03: 15 mL via OROMUCOSAL
  Filled 2019-12-03: qty 15

## 2019-12-03 NOTE — Progress Notes (Signed)
CRITICAL CARE NOTE  59 y.o.malew/chronic neck and back pain whopresented to [facility] 9/2/2021for issues with an incarcerated ventral hernia. In the process of being evaluated the patient was noted to by hypoxic and tested postive for COVID-19.Patient was admitted for management of acute respiratory failure with hypoxia due to COVID-19 pneumonia. Patient transferred to the ICU/stepdown on 27 November 2019 due to increasing FiO2 requirements.  9/14 progressive resp failure, patient decided to be placed on VENT 9/15 patient emergently intubated, RT CVL placed, LT FEM ART PLACED 9/16 severe hypoxia, proned position  CC  follow up respiratory failure  HPI Patient remains critically ill Prognosis is guarded High risk for cardiac arrest/death Prognosis is poor  Vent Mode: PRVC FiO2 (%):  [100 %] 100 % Set Rate:  [26 bmp-28 bmp] 28 bmp Vt Set:  [500 mL-550 mL] 550 mL PEEP:  [15 cmH20] 15 cmH20 Plateau Pressure:  [46 cmH20] 46 cmH20  BP 102/70   Pulse 79   Temp (!) 97 F (36.1 C)   Resp (!) 28   Ht 5' 10.98" (1.803 m)   Wt 88.4 kg   SpO2 100%   BMI 27.19 kg/m    I/O last 3 completed shifts: In: 3468.9 [I.V.:3168.9; IV Piggyback:300] Out: 740 [Urine:740] No intake/output data recorded.  SpO2: 100 % O2 Flow Rate (L/min): 55 L/min FiO2 (%): 100 %  Estimated body mass index is 27.19 kg/m as calculated from the following:   Height as of this encounter: 5' 10.98" (1.803 m).   Weight as of this encounter: 88.4 kg.  SIGNIFICANT EVENTS   REVIEW OF SYSTEMS  PATIENT IS UNABLE TO PROVIDE COMPLETE REVIEW OF SYSTEMS DUE TO SEVERE CRITICAL ILLNESS      COVID-19 DISASTER DECLARATION:   FULL CONTACT PHYSICAL EXAMINATION WAS NOT POSSIBLE DUE TO TREATMENT OF COVID-19  AND CONSERVATION OF PERSONAL PROTECTIVE EQUIPMENT, LIMITED EXAM FINDINGS INCLUDE-   PHYSICAL EXAMINATION:  GENERAL:critically ill appearing, +resp distress NEUROLOGIC: obtunded, GCS<8   Patient assessed or  the symptoms described in the history of present illness.  In the context of the Global COVID-19 pandemic, which necessitated consideration that the patient might be at risk for infection with the SARS-CoV-2 virus that causes COVID-19, Institutional protocols and algorithms that pertain to the evaluation of patients at risk for COVID-19 are in a state of rapid change based on information released by regulatory bodies including the CDC and federal and state organizations. These policies and algorithms were followed during the patient's care while in hospital.    MEDICATIONS: I have reviewed all medications and confirmed regimen as documented   CULTURE RESULTS   Recent Results (from the past 240 hour(s))  MRSA PCR Screening     Status: None   Collection Time: 11/27/19 12:31 PM   Specimen: Nasal Mucosa; Nasopharyngeal  Result Value Ref Range Status   MRSA by PCR NEGATIVE NEGATIVE Final    Comment:        The GeneXpert MRSA Assay (FDA approved for NASAL specimens only), is one component of a comprehensive MRSA colonization surveillance program. It is not intended to diagnose MRSA infection nor to guide or monitor treatment for MRSA infections. Performed at [facility], [address], [place], [place] [address]           IMAGING    DG Chest Port 1 View  Result Date: 12/03/2019 CLINICAL DATA:  COVID pneumonia. EXAM: PORTABLE CHEST 1 VIEW COMPARISON:  12/02/2019 FINDINGS: The endotracheal tube, NG tube and right IJ catheters are stable.  New fairly extensive pneumomediastinum without definite pneumothorax. There is also significant new subcutaneous emphysema involving the right chest and right neck. The cardiac silhouette, mediastinal and hilar contours are stable. Persistent diffuse interstitial and airspace process in the lungs consistent with COVID pneumonia. No pleural effusion. IMPRESSION: 1. Stable support apparatus. 2. New fairly extensive pneumomediastinum and new  subcutaneous emphysema involving the right chest and right neck. No definite pneumothorax but suspect occult pneumothorax. Chest CT may be helpful for further evaluation if clinically indicated. These results will be called to the ordering clinician or representative by the Radiologist Assistant, and communication documented in the PACS or [employer]. Electronically Signed   By: Rudie Meyer M.D.   On: 12/03/2019 07:48     Nutrition Status: Nutrition Problem: Inadequate oral intake Etiology: inability to eat Signs/Symptoms: NPO status Interventions: Tube feeding, Prostat, MVI     Indwelling Urinary Catheter continued, requirement due to   Reason to continue Indwelling Urinary Catheter strict Intake/Output monitoring for hemodynamic instability   Central Line/ continued, requirement due to  Reason to continue [employer] Monitoring of central venous pressure or other hemodynamic parameters and poor IV access   Ventilator continued, requirement due to severe respiratory failure   Ventilator Sedation RASS 0 to -2      ASSESSMENT AND PLAN SYNOPSIS  Acute hypoxemic respiratory failure due to COVID-19 pneumonia / ARDS Mechanical ventilation via ARDS protocol, target PRVC 6 cc/kg Wean PEEP and FiO2 as able Goal plateau pressure less than 30, driving pressure less than 15 Paralytics if necessary for vent synchrony, gas exchange Cycle prone positioning if necessary for oxygenation Deep sedation per PAD protocol, goal RASS -4, currently fentanyl, midazolam Diuresis as blood pressure and renal function can tolerate, goal CVP 5-8.   diuresis as tolerated based on Kidney function VAP prevention order set S/p Remdesivir  + BARIC IV STEROIDS    Severe ACUTE Hypoxic and Hypercapnic Respiratory Failure -continue Full MV support -continue Bronchodilator Therapy -Wean Fio2 and PEEP as tolerated -VAP/VENT bundle implementation   Morbid obesity, possible OSA.   Will certainly  impact respiratory mechanics, ventilator weaning Suspect will need to consider additional PEEP    NEUROLOGY Acute toxic metabolic encephalopathy, need for sedation Goal RASS -2 to -3  SHOCK-SEPSIS DUE TO COVID -use vasopressors to keep MAP>65    CARDIAC ICU monitoring  ID -continue IV abx as prescibed -follow up cultures  GI GI PROPHYLAXIS as indicated   DIET-->TF's as tolerated Constipation protocol as indicated  ENDO - will use ICU hypoglycemic\Hyperglycemia protocol if indicated     ELECTROLYTES -follow labs as needed -replace as needed -pharmacy consultation and following   DVT/GI PRX ordered and assessed TRANSFUSIONS AS NEEDED MONITOR FSBS I Assessed the need for Labs I Assessed the need for Foley I Assessed the need for Central Venous Line Family Discussion when available I Assessed the need for Mobilization I made an Assessment of medications to be adjusted accordingly Safety Risk assessment completed   CASE DISCUSSED IN MULTIDISCIPLINARY ROUNDS WITH ICU TEAM  Critical Care Time devoted to patient care services described in this note is 45 minutes.   Overall, patient is critically ill, prognosis is guarded.  Patient with Multiorgan failure and at high risk for cardiac arrest and death.    Lucie Leather, M.D.  [name]  Medical Director [facility] Medical Director [facility] Cardio-Pulmonary Department

## 2019-12-03 NOTE — Progress Notes (Signed)
RT at bedside assisting with patient repositioning, from prone to supine. ET tube secure. Tube holder put in place. No issues with airway.

## 2019-12-04 ENCOUNTER — Inpatient Hospital Stay: Payer: HRSA Program

## 2019-12-04 LAB — CBC WITH DIFFERENTIAL/PLATELET
Abs Immature Granulocytes: 0.22 10*3/uL — ABNORMAL HIGH (ref 0.00–0.07)
Basophils Absolute: 0 10*3/uL (ref 0.0–0.1)
Basophils Relative: 0 %
Eosinophils Absolute: 0 10*3/uL (ref 0.0–0.5)
Eosinophils Relative: 0 %
HCT: 46.2 % (ref 39.0–52.0)
Hemoglobin: 15.1 g/dL (ref 13.0–17.0)
Immature Granulocytes: 1 %
Lymphocytes Relative: 1 %
Lymphs Abs: 0.2 10*3/uL — ABNORMAL LOW (ref 0.7–4.0)
MCH: 29 pg (ref 26.0–34.0)
MCHC: 32.7 g/dL (ref 30.0–36.0)
MCV: 88.8 fL (ref 80.0–100.0)
Monocytes Absolute: 0.5 10*3/uL (ref 0.1–1.0)
Monocytes Relative: 3 %
Neutro Abs: 15.5 10*3/uL — ABNORMAL HIGH (ref 1.7–7.7)
Neutrophils Relative %: 95 %
Platelets: 147 10*3/uL — ABNORMAL LOW (ref 150–400)
RBC: 5.2 MIL/uL (ref 4.22–5.81)
RDW: 15.7 % — ABNORMAL HIGH (ref 11.5–15.5)
WBC: 16.5 10*3/uL — ABNORMAL HIGH (ref 4.0–10.5)
nRBC: 0 % (ref 0.0–0.2)

## 2019-12-04 LAB — TRIGLYCERIDES: Triglycerides: 207 mg/dL — ABNORMAL HIGH (ref <150)

## 2019-12-04 LAB — RENAL FUNCTION PANEL
Albumin: 1.9 g/dL — ABNORMAL LOW (ref 3.5–5.0)
Anion gap: 14 (ref 5–15)
BUN: 54 mg/dL — ABNORMAL HIGH (ref 8–23)
CO2: 31 mmol/L (ref 22–32)
Calcium: 8.2 mg/dL — ABNORMAL LOW (ref 8.9–10.3)
Chloride: 94 mmol/L — ABNORMAL LOW (ref 98–111)
Creatinine, Ser: 1.11 mg/dL (ref 0.61–1.24)
GFR calc Af Amer: >60 mL/min (ref >60)
GFR calc non Af Amer: >60 mL/min (ref >60)
Glucose, Bld: 151 mg/dL — ABNORMAL HIGH (ref 70–99)
Phosphorus: 2.9 mg/dL (ref 2.5–4.6)
Potassium: 5.1 mmol/L (ref 3.5–5.1)
Sodium: 139 mmol/L (ref 135–145)

## 2019-12-04 LAB — GLUCOSE, CAPILLARY
Glucose-Capillary: 114 mg/dL — ABNORMAL HIGH (ref 70–99)
Glucose-Capillary: 121 mg/dL — ABNORMAL HIGH (ref 70–99)
Glucose-Capillary: 123 mg/dL — ABNORMAL HIGH (ref 70–99)
Glucose-Capillary: 125 mg/dL — ABNORMAL HIGH (ref 70–99)
Glucose-Capillary: 139 mg/dL — ABNORMAL HIGH (ref 70–99)
Glucose-Capillary: 140 mg/dL — ABNORMAL HIGH (ref 70–99)

## 2019-12-04 LAB — MAGNESIUM: Magnesium: 2.8 mg/dL — ABNORMAL HIGH (ref 1.7–2.4)

## 2019-12-04 MED ORDER — VITAL HIGH PROTEIN PO LIQD
1000.0000 mL | ORAL | Status: DC
  Administered 2019-12-04: 1000 mL

## 2019-12-04 MED FILL — Sodium Chloride IV Soln 0.9%: INTRAVENOUS | Qty: 100 | Status: AC

## 2019-12-04 MED FILL — Vasopressin IV Soln 20 Unit/ML (For IV Infusion): INTRAVENOUS | Qty: 20 | Status: AC

## 2019-12-04 NOTE — Progress Notes (Signed)
Nutrition Follow-up  DOCUMENTATION CODES:   Not applicable  INTERVENTION:  Once OGT is advanced per recommendations from abdominal x-ray on 9/15, initiate Vital High Protein at 20 mL/hr.  If patient tolerates and is on stable or decreases doses of pressors recommend advancing tomorrow by 15 mL/hr every 8 hours to goal rate of Vital High Protein at 65 mL/hr (1560 mL goal daily volume) per tube. Goal regimen provides 1560 kcal, 137 grams of protein, 1310 mL H2O daily. With current propofol rate provides 2260 kcal daily.  Monitor magnesium, potassium, and phosphorus daily for at least 3 days, MD to replete as needed, as pt is at risk for refeeding syndrome.  NUTRITION DIAGNOSIS:   Inadequate oral intake related to inability to eat as evidenced by NPO status.  Ongoing.  GOAL:   Patient will meet greater than or equal to 90% of their needs  Not met but progressing.  MONITOR:   Vent status, Labs, Weight trends, I & O's  REASON FOR ASSESSMENT:   Ventilator    ASSESSMENT:   59 year old male with chronic neck and back pain presented to [facility] on 9/2 for incarcerated ventral hernia and was found to have COVID-19.  9/10 transferred to ICU 9/15 intubated  Patient is currently intubated on ventilator support MV: 15.1 L/min Temp (24hrs), Avg:98.5 F (36.9 C), Min:96.3 F (35.7 C), Max:99.7 F (37.6 C)  Propofol: 26.5 ml/hr (700 kcal daily)  Medications reviewed and include: vitamin C 1000 mg daily per tube, Colace 100 mg BID, famotidine, Colace 100 mg BID per tube, famotidine, Solu-Cortef 50 mg Q6hrs IV, Novolog 0-15 units Q4hrs, Miralax, zinc sulfate 220 mg daily, azithromycin, ceftriaxone, fentanyl gtt, norepinephrine gtt at 18 mcg/min, propofol gtt. Vasopressin gtt now off.  Labs reviewed: CBG 127-140, Chloride 94, BUN 54, Magnesium 2.8.  I/O: 625 mL UOP yesterday (0.3 mL/kg/hr)  No weight to trend since 9/10.  Enteral Access: OGT placed 9/15; per abdominal x-ray 9/15 tip  is in the stomach but side port is proximal to GE junction and advancement by 7 cm was recommended; discussed with RN   Discussed with RN and on rounds. Pressor dose decreasing. Plan is to start trickle rate of tube feeds today.  Diet Order:   Diet Order            Diet NPO time specified  Diet effective now                EDUCATION NEEDS:   No education needs have been identified at this time  Skin:  Skin Assessment: Reviewed RN Assessment  Last BM:  11/30/2019 per chart  Height:   Ht Readings from Last 1 Encounters:  12/03/19 5' 10.98" (1.803 m)   Weight:   Wt Readings from Last 1 Encounters:  11/27/19 88.4 kg   Ideal Body Weight:  78.2 kg  BMI:  Body mass index is 27.19 kg/m.  Estimated Nutritional Needs:   Kcal:  2298  Protein:  135-145 grams  Fluid:  >/= 2.3 L/day  Jacklynn Barnacle, MS, RD, LDN Pager number available on Amion

## 2019-12-04 NOTE — Progress Notes (Signed)
Advance OG tube from 65 to 70cm and put in an order for a kub, per MD

## 2019-12-04 NOTE — Progress Notes (Signed)
CRITICAL CARE NOTE 59 y.o.malew/chronic neck and back pain whopresented to [facility] 10/02/21for issues with an incarcerated ventral hernia. In the process of being evaluated the patient was noted to by hypoxic and tested postive for COVID-19.Patient was admitted for management of acute respiratory failure with hypoxia due to COVID-19 pneumonia. Patient transferred to the ICU/stepdown on 27 November 2019 due to increasing FiO2 requirements.  9/14 progressive resp failure, patient decided to be placed on VENT 9/15 patient emergently intubated, RT CVL placed, LT FEM ART PLACED 9/16 severe hypoxia, proned position 9/17 severe hypoxia, plan for proning today Wife Boneta Lucks has been updated daily    CC  follow up respiratory failure  SUBJECTIVE Patient remains critically ill Prognosis is guarded Severe ARDS  Vent Mode: PRVC FiO2 (%):  [75 %] 75 % Set Rate:  [28 bmp] 28 bmp Vt Set:  [550 mL] 550 mL PEEP:  [15 cmH20] 15 cmH20 Plateau Pressure:  [19 cmH20-26 cmH20] 26 cmH20    BP 113/74   Pulse 81   Temp 98.8 F (37.1 C) (Rectal)   Resp (!) 28   Ht 5' 10.98" (1.803 m)   Wt 88.4 kg   SpO2 96%   BMI 27.19 kg/m    I/O last 3 completed shifts: In: 3130.1 [I.V.:2780; IV Piggyback:350] Out: 820 [Urine:820] No intake/output data recorded.  SpO2: 96 % O2 Flow Rate (L/min): 55 L/min FiO2 (%): 75 %  Estimated body mass index is 27.19 kg/m as calculated from the following:   Height as of this encounter: 5' 10.98" (1.803 m).   Weight as of this encounter: 88.4 kg.  SIGNIFICANT EVENTS   REVIEW OF SYSTEMS  PATIENT IS UNABLE TO PROVIDE COMPLETE REVIEW OF SYSTEMS DUE TO SEVERE CRITICAL ILLNESS      COVID-19 DISASTER DECLARATION:   FULL CONTACT PHYSICAL EXAMINATION WAS NOT POSSIBLE DUE TO TREATMENT OF COVID-19  AND CONSERVATION OF PERSONAL PROTECTIVE EQUIPMENT, LIMITED EXAM FINDINGS INCLUDE-   PHYSICAL EXAMINATION:  GENERAL:critically ill appearing, +resp  distress NEUROLOGIC: obtunded, GCS<8   Patient assessed or the symptoms described in the history of present illness.  In the context of the Global COVID-19 pandemic, which necessitated consideration that the patient might be at risk for infection with the SARS-CoV-2 virus that causes COVID-19, Institutional protocols and algorithms that pertain to the evaluation of patients at risk for COVID-19 are in a state of rapid change based on information released by regulatory bodies including the CDC and federal and state organizations. These policies and algorithms were followed during the patient's care while in hospital.    MEDICATIONS: I have reviewed all medications and confirmed regimen as documented   CULTURE RESULTS   Recent Results (from the past 240 hour(s))  MRSA PCR Screening     Status: None   Collection Time: 11/27/19 12:31 PM   Specimen: Nasal Mucosa; Nasopharyngeal  Result Value Ref Range Status   MRSA by PCR NEGATIVE NEGATIVE Final    Comment:        The GeneXpert MRSA Assay (FDA approved for NASAL specimens only), is one component of a comprehensive MRSA colonization surveillance program. It is not intended to diagnose MRSA infection nor to guide or monitor treatment for MRSA infections. Performed at [facility], [address] [place], [place] [address]         CBC    Component Value Date/Time   WBC 16.5 (H) 12/04/2019 0455   RBC 5.20 12/04/2019 0455   HGB 15.1 12/04/2019 0455   HCT 46.2 12/04/2019  0455   PLT 147 (L) 12/04/2019 0455   MCV 88.8 12/04/2019 0455   MCH 29.0 12/04/2019 0455   MCHC 32.7 12/04/2019 0455   RDW 15.7 (H) 12/04/2019 0455   LYMPHSABS 0.2 (L) 12/04/2019 0455   MONOABS 0.5 12/04/2019 0455   EOSABS 0.0 12/04/2019 0455   BASOSABS 0.0 12/04/2019 0455   BMP Latest Ref Rng & Units 12/04/2019 12/03/2019 12/02/2019  Glucose 70 - 99 mg/dL 220(U) 542(H) 062(B)  BUN 8 - 23 mg/dL 76(E) 83(T) 51(V)  Creatinine 0.61 - 1.24 mg/dL 6.16  0.73(X) 1.06(Y)  Sodium 135 - 145 mmol/L 139 138 136  Potassium 3.5 - 5.1 mmol/L 5.1 4.6 5.1  Chloride 98 - 111 mmol/L 94(L) 94(L) 91(L)  CO2 22 - 32 mmol/L 31 31 35(H)  Calcium 8.9 - 10.3 mg/dL 8.2(L) 8.3(L) 8.1(L)     Nutrition Status: Nutrition Problem: Inadequate oral intake Etiology: inability to eat Signs/Symptoms: NPO status Interventions: Tube feeding, Prostat, MVI     Indwelling Urinary Catheter continued, requirement due to   Reason to continue Indwelling Urinary Catheter strict Intake/Output monitoring for hemodynamic instability   Central Line/ continued, requirement due to  Reason to continue [employer] Monitoring of central venous pressure or other hemodynamic parameters and poor IV access   Ventilator continued, requirement due to severe respiratory failure   Ventilator Sedation RASS 0 to -2      ASSESSMENT AND PLAN SYNOPSIS  Acute hypoxemic respiratory failure due to COVID-19 pneumonia / ARDS Mechanical ventilation via ARDS protocol, target PRVC 6 cc/kg Wean PEEP and FiO2 as able Goal plateau pressure less than 30, driving pressure less than 15 Paralytics if necessary for vent synchrony, gas exchange Cycle prone positioning if necessary for oxygenation Deep sedation per PAD protocol, goal RASS -4, currently fentanyl, midazolam Diuresis as blood pressure and renal function can tolerate, goal CVP 5-8.   diuresis as tolerated based on Kidney function VAP prevention order set S/p Remdesivir + BARIC IV STEROIDS     Severe ACUTE Hypoxic and Hypercapnic Respiratory Failure -continue Full MV support -continue Bronchodilator Therapy -Wean Fio2 and PEEP as tolerated -VAP/VENT bundle implementation  obesity, possible OSA.   Will certainly impact respiratory mechanics, ventilator weaning Suspect will need to consider additional PEEP  ACUTE KIDNEY INJURY/Renal Failure -continue Foley Catheter-assess need -Avoid nephrotoxic agents -Follow urine output,  BMP -Ensure adequate renal perfusion, optimize oxygenation -Renal dose medications     NEUROLOGY Acute toxic metabolic encephalopathy, need for sedation Goal RASS -2 to -3  SHOCK-SEPSIS due to OCIVD 19 pneumonia -use vasopressors to keep MAP>65   CARDIAC ICU monitoring   GI GI PROPHYLAXIS as indicated   DIET-->NPO Constipation protocol as indicated  ENDO - will use ICU hypoglycemic\Hyperglycemia protocol if indicated     ELECTROLYTES -follow labs as needed -replace as needed -pharmacy consultation and following   DVT/GI PRX ordered and assessed TRANSFUSIONS AS NEEDED MONITOR FSBS I Assessed the need for Labs I Assessed the need for Foley I Assessed the need for Central Venous Line Family Discussion when available I Assessed the need for Mobilization I made an Assessment of medications to be adjusted accordingly Safety Risk assessment completed   CASE DISCUSSED IN MULTIDISCIPLINARY ROUNDS WITH ICU TEAM  Critical Care Time devoted to patient care services described in this note is 45 minutes.   Overall, patient is critically ill, prognosis is guarded.  Patient with Multiorgan failure and at high risk for cardiac arrest and death.    Lucie Leather, M.D.  [name]  Medical Director [place] Director [facility] Cardio-Pulmonary Department

## 2019-12-04 NOTE — Progress Notes (Signed)
Pt intubated and sedated throughout shift. 3 times patient did awaken and started making non purposeful movements. HR and BP went up, myself and RT were able to get in the room in time to reassure him. I put mittens on as well as went up on his sedation and gave prn bolus's. Intervention successful. Tube feeds waiting to be started once OG tube placement confirmed. Pt still on Levo. Pt proned at 1622. Will continue to monitor.

## 2019-12-05 ENCOUNTER — Inpatient Hospital Stay: Payer: HRSA Program

## 2019-12-05 LAB — CBC WITH DIFFERENTIAL/PLATELET
Abs Immature Granulocytes: 0.17 10*3/uL — ABNORMAL HIGH (ref 0.00–0.07)
Basophils Absolute: 0.1 10*3/uL (ref 0.0–0.1)
Basophils Relative: 0 %
Eosinophils Absolute: 0.2 10*3/uL (ref 0.0–0.5)
Eosinophils Relative: 1 %
HCT: 47.9 % (ref 39.0–52.0)
Hemoglobin: 15.2 g/dL (ref 13.0–17.0)
Immature Granulocytes: 1 %
Lymphocytes Relative: 1 %
Lymphs Abs: 0.2 10*3/uL — ABNORMAL LOW (ref 0.7–4.0)
MCH: 29.7 pg (ref 26.0–34.0)
MCHC: 31.7 g/dL (ref 30.0–36.0)
MCV: 93.7 fL (ref 80.0–100.0)
Monocytes Absolute: 0.4 10*3/uL (ref 0.1–1.0)
Monocytes Relative: 2 %
Neutro Abs: 16.5 10*3/uL — ABNORMAL HIGH (ref 1.7–7.7)
Neutrophils Relative %: 95 %
Platelets: 126 10*3/uL — ABNORMAL LOW (ref 150–400)
RBC: 5.11 MIL/uL (ref 4.22–5.81)
RDW: 16 % — ABNORMAL HIGH (ref 11.5–15.5)
WBC Morphology: INCREASED
WBC: 17.6 10*3/uL — ABNORMAL HIGH (ref 4.0–10.5)
nRBC: 0.1 % (ref 0.0–0.2)

## 2019-12-05 LAB — RENAL FUNCTION PANEL
Albumin: 2 g/dL — ABNORMAL LOW (ref 3.5–5.0)
Anion gap: 9 (ref 5–15)
BUN: 50 mg/dL — ABNORMAL HIGH (ref 8–23)
CO2: 34 mmol/L — ABNORMAL HIGH (ref 22–32)
Calcium: 8.4 mg/dL — ABNORMAL LOW (ref 8.9–10.3)
Chloride: 97 mmol/L — ABNORMAL LOW (ref 98–111)
Creatinine, Ser: 1.07 mg/dL (ref 0.61–1.24)
GFR calc Af Amer: >60 mL/min (ref >60)
GFR calc non Af Amer: >60 mL/min (ref >60)
Glucose, Bld: 178 mg/dL — ABNORMAL HIGH (ref 70–99)
Phosphorus: 4.5 mg/dL (ref 2.5–4.6)
Potassium: 5.5 mmol/L — ABNORMAL HIGH (ref 3.5–5.1)
Sodium: 140 mmol/L (ref 135–145)

## 2019-12-05 LAB — PROTIME-INR
INR: 1.2 (ref 0.8–1.2)
Prothrombin Time: 14.7 seconds (ref 11.4–15.2)

## 2019-12-05 LAB — POTASSIUM: Potassium: 5.7 mmol/L — ABNORMAL HIGH (ref 3.5–5.1)

## 2019-12-05 LAB — APTT: aPTT: 33 seconds (ref 24–36)

## 2019-12-05 LAB — GLUCOSE, CAPILLARY
Glucose-Capillary: 163 mg/dL — ABNORMAL HIGH (ref 70–99)
Glucose-Capillary: 189 mg/dL — ABNORMAL HIGH (ref 70–99)
Glucose-Capillary: 178 mg/dL — ABNORMAL HIGH (ref 70–99)
Glucose-Capillary: 207 mg/dL — ABNORMAL HIGH (ref 70–99)

## 2019-12-05 LAB — BLOOD GAS, ARTERIAL
Acid-Base Excess: 3.6 mmol/L — ABNORMAL HIGH (ref 0.0–2.0)
Bicarbonate: 34.3 mmol/L — ABNORMAL HIGH (ref 20.0–28.0)
FIO2: 0.7
MECHVT: 550 mL
O2 Saturation: 80.9 %
PEEP: 15 cmH2O
Patient temperature: 37
RATE: 28 resp/min
pCO2 arterial: 82 mmHg (ref 32.0–48.0)
pH, Arterial: 7.23 — ABNORMAL LOW (ref 7.350–7.450)
pO2, Arterial: 54 mmHg — ABNORMAL LOW (ref 83.0–108.0)

## 2019-12-05 LAB — OSMOLALITY, URINE: Osmolality, Ur: 568 mOsm/kg (ref 300–900)

## 2019-12-05 LAB — LACTIC ACID, PLASMA
Lactic Acid, Venous: 2.5 mmol/L (ref 0.5–1.9)
Lactic Acid, Venous: 2.7 mmol/L (ref 0.5–1.9)

## 2019-12-05 LAB — MAGNESIUM: Magnesium: 2.9 mg/dL — ABNORMAL HIGH (ref 1.7–2.4)

## 2019-12-05 LAB — TRIGLYCERIDES: Triglycerides: 271 mg/dL — ABNORMAL HIGH (ref <150)

## 2019-12-05 MED ORDER — SODIUM CHLORIDE 0.9% FLUSH: 3.0000 mL | INTRAVENOUS | Status: DC | PRN

## 2019-12-05 MED ORDER — SODIUM CHLORIDE 0.9 % IV BOLUS
500.0000 mL | Freq: Once | INTRAVENOUS | Status: AC
  Administered 2019-12-05: 500 mL via INTRAVENOUS

## 2019-12-05 MED ORDER — PATIROMER SORBITEX CALCIUM 8.4 G PO PACK
8.4000 g | PACK | Freq: Once | ORAL | Status: AC
  Administered 2019-12-05: 8.4 g via ORAL
  Filled 2019-12-05: qty 1

## 2019-12-05 MED ORDER — SODIUM ZIRCONIUM CYCLOSILICATE 5 G PO PACK
10.0000 g | PACK | Freq: Once | ORAL | Status: AC
  Administered 2019-12-05: 10 g via ORAL
  Filled 2019-12-05: qty 2

## 2019-12-05 MED ORDER — INSULIN ASPART 100 UNIT/ML IV SOLN
10.0000 [IU] | Freq: Once | INTRAVENOUS | Status: AC
  Administered 2019-12-05: 10 [IU] via INTRAVENOUS
  Filled 2019-12-05: qty 0.1

## 2019-12-05 MED ORDER — DEXTROSE 50 % IV SOLN
1.0000 | Freq: Once | INTRAVENOUS | Status: AC
  Administered 2019-12-05: 50 mL via INTRAVENOUS
  Filled 2019-12-05: qty 50

## 2019-12-05 MED ORDER — FUROSEMIDE 10 MG/ML IJ SOLN
40.0000 mg | Freq: Once | INTRAMUSCULAR | Status: AC
  Administered 2019-12-05: 40 mg via INTRAVENOUS
  Filled 2019-12-05: qty 4

## 2019-12-05 MED ORDER — ARGATROBAN 50 MG/50ML IV SOLN
0.6500 ug/kg/min | INTRAVENOUS | Status: DC
  Administered 2019-12-05: 0.5 ug/kg/min via INTRAVENOUS
  Administered 2019-12-06 – 2019-12-07 (×3): 0.65 ug/kg/min via INTRAVENOUS
  Filled 2019-12-05 (×6): qty 50

## 2019-12-05 MED ORDER — INSULIN REGULAR HUMAN 100 UNIT/ML IJ SOLN
10.0000 [IU] | Freq: Once | INTRAMUSCULAR | Status: AC
  Administered 2019-12-05: 10 [IU] via INTRAVENOUS
  Filled 2019-12-05: qty 10

## 2019-12-05 NOTE — Plan of Care (Signed)
  Problem: Clinical Measurements: Goal: Diagnostic test results will improve Outcome: Progressing Goal: Cardiovascular complication will be avoided Outcome: Progressing   Problem: Activity: Goal: Risk for activity intolerance will decrease Outcome: Progressing   Problem: Nutrition: Goal: Adequate nutrition will be maintained Outcome: Progressing   Problem: Elimination: Goal: Will not experience complications related to bowel motility Outcome: Progressing Goal: Will not experience complications related to urinary retention Outcome: Progressing   Problem: Safety: Goal: Ability to remain free from injury will improve Outcome: Progressing   Problem: Education: Goal: Knowledge of General Education information will improve Description: Including pain rating scale, medication(s)/side effects and non-pharmacologic comfort measures Outcome: Not Progressing   Problem: Health Behavior/Discharge Planning: Goal: Ability to manage health-related needs will improve Outcome: Not Progressing   Problem: Clinical Measurements: Goal: Ability to maintain clinical measurements within normal limits will improve Outcome: Not Progressing Goal: Will remain free from infection Outcome: Not Progressing Goal: Respiratory complications will improve Outcome: Not Progressing

## 2019-12-05 NOTE — Plan of Care (Signed)

## 2019-12-05 NOTE — Progress Notes (Signed)
ANTICOAGULATION CONSULT NOTE - Initial Consult  Pharmacy Consult for Argatroban Indication: r/o HIT  Allergies  Allergen Reactions  . Heparin     Ruling out HIT    Patient Measurements: Height: 5' 10.98" (180.3 cm) Weight: 89.5 kg (197 lb 5 oz) IBW/kg (Calculated) : 75.26   Vital Signs: Temp: 98.4 F (36.9 C) (09/18 2035) Temp Source: Rectal (09/18 2035) BP: 110/77 (09/18 2035) Pulse Rate: 83 (09/18 2035)  Labs: Recent Labs    12/03/19 0624 12/03/19 0624 12/04/19 0455 12/05/19 0351 12/05/19 1707  HGB 15.9   < > 15.1 15.2  --   HCT 49.0  --  46.2 47.9  --   PLT 194  --  147* 126*  --   APTT  --   --   --   --  33  LABPROT  --   --   --   --  14.7  INR  --   --   --   --  1.2  CREATININE 1.43*  --  1.11 1.07  --    < > = values in this interval not displayed.    Estimated Creatinine Clearance: 77.2 mL/min (by C-G formula based on SCr of 1.07 mg/dL).   Medical History: Past Medical History:  Diagnosis Date  . Chronic neck and back pain       Assessment: 59 yo male on enoxaparin Plt 295 on admission on 9/9 now 252>194>147>126 in the last few days with plt count 126 today.  9/18 @ 2101 aPTT 33. Subtherapeutic  Goal of Therapy:  aPTT 50-90 seconds Monitor platelets by anticoagulation protocol: Yes   Plan:  Increase by 30% (argatroban 0.65 mcg/kg/min) - nursing notified of changes.  Check aPTT in 2h after rate/dose change of infusion  CBC in AM  Pharmacy will continue to follow.   Katha Cabal 12/05/2019,9:37 PM

## 2019-12-05 NOTE — Progress Notes (Signed)
CRITICAL CARE NOTE 59 y.o.malew/chronic neck and back pain whopresented to [facility] 9/2/2021for issues with an incarcerated ventral hernia. In the process of being evaluated the patient was noted to by hypoxic and tested postive for COVID-19.Patient was admitted for management of acute respiratory failure with hypoxia due to COVID-19 pneumonia. Patient transferred to the ICU/stepdown on 27 November 2019 due to increasing FiO2 requirements.  9/14 progressive resp failure, patient decided to be placed on VENT 9/15 patient emergently intubated, RT CVL placed, LT FEM ART PLACED 9/16 severe hypoxia, proned position 9/17 severe hypoxia, plan for proning today Wife Boneta Lucks has been updated daily 9/18 severe ARDS hypoxia, proned positioned   CC  follow up respiratory failure  SUBJECTIVE Severe ARDS Severe hypoxia  Vent Mode: PRVC FiO2 (%):  [70 %-75 %] 70 % Set Rate:  [28 bmp] 28 bmp Vt Set:  [550 mL] 550 mL PEEP:  [15 cmH20] 15 cmH20 Plateau Pressure:  [16 cmH20-38 cmH20] 16 cmH20      BP 110/69   Pulse 100   Temp (!) 97.3 F (36.3 C)   Resp (!) 28   Ht 5' 10.98" (1.803 m)   Wt 89.5 kg   SpO2 96%   BMI 27.53 kg/m    I/O last 3 completed shifts: In: 3561.8 [I.V.:3021.4; NG/GT:190.3; IV Piggyback:350.1] Out: 1175 [Urine:1175] Total I/O In: 396.9 [I.V.:187.2; NG/GT:209.7] Out: 220 [Urine:220]  SpO2: 96 % O2 Flow Rate (L/min): 55 L/min FiO2 (%): 70 %  Estimated body mass index is 27.53 kg/m as calculated from the following:   Height as of this encounter: 5' 10.98" (1.803 m).   Weight as of this encounter: 89.5 kg.  REVIEW OF SYSTEMS  PATIENT IS UNABLE TO PROVIDE COMPLETE REVIEW OF SYSTEM S DUE TO SEVERE CRITICAL ILLNESS AND ENCEPHALOPATHY   Pressure Injury 12/05/19 Face Left Stage 2 -  Partial thickness loss of dermis presenting as a shallow open injury with a red, pink wound bed without slough. (Active)  12/05/19 0500  Location: Face  Location Orientation: Left   Staging: Stage 2 -  Partial thickness loss of dermis presenting as a shallow open injury with a red, pink wound bed without slough.  Wound Description (Comments):   Present on Admission: No    COVID-19 DISASTER DECLARATION:   FULL CONTACT PHYSICAL EXAMINATION WAS NOT POSSIBLE DUE TO TREATMENT OF COVID-19  AND CONSERVATION OF PERSONAL PROTECTIVE EQUIPMENT, LIMITED EXAM FINDINGS INCLUDE-   PHYSICAL EXAMINATION:  GENERAL:critically ill appearing, +resp distress NEUROLOGIC: obtunded, GCS<8 PRONED POSTIONED  Patient assessed or the symptoms described in the history of present illness.  In the context of the Global COVID-19 pandemic, which necessitated consideration that the patient might be at risk for infection with the SARS-CoV-2 virus that causes COVID-19, Institutional protocols and algorithms that pertain to the evaluation of patients at risk for COVID-19 are in a state of rapid change based on information released by regulatory bodies including the CDC and federal and state organizations. These policies and algorithms were followed during the patient's care while in hospital.    MEDICATIONS: I have reviewed all medications and confirmed regimen as documented   CULTURE RESULTS   Recent Results (from the past 240 hour(s))  MRSA PCR Screening     Status: None   Collection Time: 11/27/19 12:31 PM   Specimen: Nasal Mucosa; Nasopharyngeal  Result Value Ref Range Status   MRSA by PCR NEGATIVE NEGATIVE Final    Comment:        The GeneXpert MRSA Assay (FDA  approved for NASAL specimens only), is one component of a comprehensive MRSA colonization surveillance program. It is not intended to diagnose MRSA infection nor to guide or monitor treatment for MRSA infections. Performed at [facility], [address] [place], [place] [address]         CBC    Component Value Date/Time   WBC 17.6 (H) 12/05/2019 0351   RBC 5.11 12/05/2019 0351   HGB 15.2 12/05/2019 0351    HCT 47.9 12/05/2019 0351   PLT 126 (L) 12/05/2019 0351   MCV 93.7 12/05/2019 0351   MCH 29.7 12/05/2019 0351   MCHC 31.7 12/05/2019 0351   RDW 16.0 (H) 12/05/2019 0351   LYMPHSABS 0.2 (L) 12/05/2019 0351   MONOABS 0.4 12/05/2019 0351   EOSABS 0.2 12/05/2019 0351   BASOSABS 0.1 12/05/2019 0351   BMP Latest Ref Rng & Units 12/05/2019 12/04/2019 12/03/2019  Glucose 70 - 99 mg/dL 062(I) 948(N) 462(V)  BUN 8 - 23 mg/dL 03(J) 00(X) 38(H)  Creatinine 0.61 - 1.24 mg/dL 8.29 9.37 1.69(C)  Sodium 135 - 145 mmol/L 140 139 138  Potassium 3.5 - 5.1 mmol/L 5.5(H) 5.1 4.6  Chloride 98 - 111 mmol/L 97(L) 94(L) 94(L)  CO2 22 - 32 mmol/L 34(H) 31 31  Calcium 8.9 - 10.3 mg/dL 7.8(L) 3.8(B) 8.3(L)     Nutrition Status: Nutrition Problem: Inadequate oral intake Etiology: inability to eat Signs/Symptoms: NPO status Interventions: Tube feeding, Prostat, MVI   BMP Latest Ref Rng & Units 12/05/2019 12/04/2019 12/03/2019  Glucose 70 - 99 mg/dL 017(P) 102(H) 852(D)  BUN 8 - 23 mg/dL 78(E) 42(P) 53(I)  Creatinine 0.61 - 1.24 mg/dL 1.44 3.15 4.00(Q)  Sodium 135 - 145 mmol/L 140 139 138  Potassium 3.5 - 5.1 mmol/L 5.5(H) 5.1 4.6  Chloride 98 - 111 mmol/L 97(L) 94(L) 94(L)  CO2 22 - 32 mmol/L 34(H) 31 31  Calcium 8.9 - 10.3 mg/dL 6.7(Y) 1.9(J) 8.3(L)     Indwelling Urinary Catheter continued, requirement due to   Reason to continue Indwelling Urinary Catheter strict Intake/Output monitoring for hemodynamic instability   Central Line/ continued, requirement due to  Reason to continue [employer] Monitoring of central venous pressure or other hemodynamic parameters and poor IV access   Ventilator continued, requirement due to severe respiratory failure   Ventilator Sedation RASS 0 to -2      ASSESSMENT AND PLAN SYNOPSIS Acute hypoxemic respiratory failure due to COVID-19 pneumonia / ARDS Mechanical ventilation via ARDS protocol, target PRVC 6 cc/kg Wean PEEP and FiO2 as able Goal plateau  pressure less than 30, driving pressure less than 15 Paralytics if necessary for vent synchrony, gas exchange Cycle prone positioning if necessary for oxygenation Deep sedation per PAD protocol, goal RASS -4, currently fentanyl, midazolam Diuresis as blood pressure and renal function can tolerate, goal CVP 5-8.   diuresis as tolerated based on Kidney function VAP prevention order set  Severe ACUTE Hypoxic and Hypercapnic Respiratory Failure -continue Mechanical Ventilator support -continue Bronchodilator Therapy -Wean Fio2 and PEEP as tolerated -VAP/VENT bundle implementation   obesity, possible OSA.   Will certainly impact respiratory mechanics, ventilator weaning Suspect will need to consider additional PEEP   NEUROLOGY Acute toxic metabolic encephalopathy, need for sedation Goal RASS -2 to -3   SHOCK-SEPSIS due to OCIVD 19 pneumonia -use vasopressors to keep MAP>65   GI GI PROPHYLAXIS as indicated  NUTRITIONAL STATUS DIET-->TF's as tolerated Constipation protocol as indicated  ELECTROLYTES -follow labs as needed -replace as needed -pharmacy consultation and  following   DVT/GI PRX ordered and assessed TRANSFUSIONS AS NEEDED MONITOR FSBS I Assessed the need for Labs I Assessed the need for Foley I Assessed the need for Central Venous Line Family Discussion when available I Assessed the need for Mobilization I made an Assessment of medications to be adjusted accordingly Safety Risk assessment completed  CASE DISCUSSED IN MULTIDISCIPLINARY ROUNDS WITH ICU TEAM   Critical Care Time devoted to patient care services described in this note is 45 minutes.   Overall, patient is critically ill, prognosis is guarded.  Patient with Multiorgan failure and at high risk for cardiac arrest and death.   Patient is DNR  Lucie Leather, M.D.  [name]  Medical Director [facility] Medical Director [facility] Cardio-Pulmonary  Department

## 2019-12-05 NOTE — Progress Notes (Addendum)
Shift summary:  - Patient remains intubated, sedated, proned, on vasopressors.  - High risk for further hemodynamic instability.  - Turned to supine at 1020 hrs.   - Lactic acid critically high. MD notified. 500 mL bolus given. Lactic continuing to increase despite bolus. MD again notified. Orders: "no addition fluids for now, repeat in AM".  - 1510 hrs: second vasopressor added. TFs held.   - Back to prone position at 1800 hrs.

## 2019-12-05 NOTE — Progress Notes (Signed)
ANTICOAGULATION CONSULT NOTE - Initial Consult  Pharmacy Consult for Argatroban Indication: r/o HIT  Allergies  Allergen Reactions  . Heparin     Ruling out HIT    Patient Measurements: Height: 5' 10.98" (180.3 cm) Weight: 89.5 kg (197 lb 5 oz) IBW/kg (Calculated) : 75.26   Vital Signs: Temp: 97.2 F (36.2 C) (09/18 1600) Temp Source: Rectal (09/18 1600) BP: 103/74 (09/18 1600) Pulse Rate: 77 (09/18 1600)  Labs: Recent Labs    12/03/19 0624 12/03/19 0624 12/04/19 0455 12/05/19 0351  HGB 15.9   < > 15.1 15.2  HCT 49.0  --  46.2 47.9  PLT 194  --  147* 126*  CREATININE 1.43*  --  1.11 1.07   < > = values in this interval not displayed.    Estimated Creatinine Clearance: 77.2 mL/min (by C-G formula based on SCr of 1.07 mg/dL).   Medical History: Past Medical History:  Diagnosis Date  . Chronic neck and back pain       Assessment: 59 yo male on enoxaparin Plt 295 on admission on 9/9 now 252>194>147>126 in the last few days with plt count 126 today.  Goal of Therapy:  aPTT 50-90 seconds Monitor platelets by anticoagulation protocol: Yes   Plan:  D/c enoxaparin Start argatroban 0.5 mcg/kg/min Check aPTT in 2h after start of infusion Baseline INR ordered, HIT Ab ordered  CBC in AM  Pharmacy will continue to follow.   Marty Heck 12/05/2019,4:41 PM

## 2019-12-06 ENCOUNTER — Inpatient Hospital Stay: Payer: HRSA Program

## 2019-12-06 DIAGNOSIS — L8990 Pressure ulcer of unspecified site, unspecified stage: Secondary | ICD-10-CM | POA: Insufficient documentation

## 2019-12-06 LAB — GLUCOSE, CAPILLARY
Glucose-Capillary: 108 mg/dL — ABNORMAL HIGH (ref 70–99)
Glucose-Capillary: 113 mg/dL — ABNORMAL HIGH (ref 70–99)
Glucose-Capillary: 114 mg/dL — ABNORMAL HIGH (ref 70–99)
Glucose-Capillary: 116 mg/dL — ABNORMAL HIGH (ref 70–99)
Glucose-Capillary: 118 mg/dL — ABNORMAL HIGH (ref 70–99)
Glucose-Capillary: 127 mg/dL — ABNORMAL HIGH (ref 70–99)

## 2019-12-06 LAB — RENAL FUNCTION PANEL
Albumin: 1.8 g/dL — ABNORMAL LOW (ref 3.5–5.0)
Anion gap: 7 (ref 5–15)
BUN: 48 mg/dL — ABNORMAL HIGH (ref 8–23)
CO2: 35 mmol/L — ABNORMAL HIGH (ref 22–32)
Calcium: 8.5 mg/dL — ABNORMAL LOW (ref 8.9–10.3)
Chloride: 101 mmol/L (ref 98–111)
Creatinine, Ser: 0.81 mg/dL (ref 0.61–1.24)
GFR calc Af Amer: >60 mL/min (ref >60)
GFR calc non Af Amer: >60 mL/min (ref >60)
Glucose, Bld: 136 mg/dL — ABNORMAL HIGH (ref 70–99)
Phosphorus: 3.2 mg/dL (ref 2.5–4.6)
Potassium: 5.2 mmol/L — ABNORMAL HIGH (ref 3.5–5.1)
Sodium: 143 mmol/L (ref 135–145)

## 2019-12-06 LAB — CBC WITH DIFFERENTIAL/PLATELET
Abs Immature Granulocytes: 0.29 10*3/uL — ABNORMAL HIGH (ref 0.00–0.07)
Basophils Absolute: 0 10*3/uL (ref 0.0–0.1)
Basophils Relative: 0 %
Eosinophils Absolute: 0 10*3/uL (ref 0.0–0.5)
Eosinophils Relative: 0 %
HCT: 44.5 % (ref 39.0–52.0)
Hemoglobin: 13.9 g/dL (ref 13.0–17.0)
Immature Granulocytes: 2 %
Lymphocytes Relative: 2 %
Lymphs Abs: 0.2 10*3/uL — ABNORMAL LOW (ref 0.7–4.0)
MCH: 29.4 pg (ref 26.0–34.0)
MCHC: 31.2 g/dL (ref 30.0–36.0)
MCV: 94.3 fL (ref 80.0–100.0)
Monocytes Absolute: 0.4 10*3/uL (ref 0.1–1.0)
Monocytes Relative: 3 %
Neutro Abs: 11.6 10*3/uL — ABNORMAL HIGH (ref 1.7–7.7)
Neutrophils Relative %: 93 %
Platelets: 100 10*3/uL — ABNORMAL LOW (ref 150–400)
RBC: 4.72 MIL/uL (ref 4.22–5.81)
RDW: 16.1 % — ABNORMAL HIGH (ref 11.5–15.5)
WBC: 12.6 10*3/uL — ABNORMAL HIGH (ref 4.0–10.5)
nRBC: 0 % (ref 0.0–0.2)

## 2019-12-06 LAB — TRIGLYCERIDES: Triglycerides: 217 mg/dL — ABNORMAL HIGH (ref <150)

## 2019-12-06 LAB — BLOOD GAS, ARTERIAL
Acid-Base Excess: 4.7 mmol/L — ABNORMAL HIGH (ref 0.0–2.0)
Bicarbonate: 35.1 mmol/L — ABNORMAL HIGH (ref 20.0–28.0)
FIO2: 0.7
MECHVT: 550 mL
O2 Saturation: 91.5 %
PEEP: 15 cmH2O
Patient temperature: 37
RATE: 28 resp/min
pCO2 arterial: 82 mmHg (ref 32.0–48.0)
pH, Arterial: 7.24 — ABNORMAL LOW (ref 7.350–7.450)
pO2, Arterial: 73 mmHg — ABNORMAL LOW (ref 83.0–108.0)

## 2019-12-06 LAB — APTT
aPTT: 61 seconds — ABNORMAL HIGH (ref 24–36)
aPTT: 62 seconds — ABNORMAL HIGH (ref 24–36)
aPTT: 63 seconds — ABNORMAL HIGH (ref 24–36)

## 2019-12-06 LAB — LACTIC ACID, PLASMA: Lactic Acid, Venous: 1.6 mmol/L (ref 0.5–1.9)

## 2019-12-06 LAB — MAGNESIUM: Magnesium: 2.5 mg/dL — ABNORMAL HIGH (ref 1.7–2.4)

## 2019-12-06 NOTE — Progress Notes (Signed)
ANTICOAGULATION CONSULT NOTE - Pharmacy Consult for Argatroban Indication: r/o HIT  Allergies  Allergen Reactions   Heparin     Ruling out HIT    Patient Measurements: Height: 5' 10.98" (180.3 cm) Weight: 89.5 kg (197 lb 5 oz) IBW/kg (Calculated) : 75.26   Vital Signs: Temp: 98.6 F (37 C) (09/19 0045) Temp Source: Rectal (09/19 0000) BP: 129/81 (09/19 0045) Pulse Rate: 87 (09/19 0045)  Labs: Recent Labs    12/03/19 0624 12/03/19 0624 12/04/19 0455 12/05/19 0351 12/05/19 1707 12/06/19 0022  HGB 15.9   < > 15.1 15.2  --   --   HCT 49.0  --  46.2 47.9  --   --   PLT 194  --  147* 126*  --   --   APTT  --   --   --   --  33 63*  LABPROT  --   --   --   --  14.7  --   INR  --   --   --   --  1.2  --   CREATININE 1.43*  --  1.11 1.07  --   --    < > = values in this interval not displayed.   Estimated Creatinine Clearance: 77.2 mL/min (by C-G formula based on SCr of 1.07 mg/dL).  Medical History: Past Medical History:  Diagnosis Date   Chronic neck and back pain    Assessment: 59 yo male on enoxaparin Plt 295 on admission on 9/9 now 252>194>147>126 in the last few days with plt count 126 today.  9/18 @ 2101 aPTT 33. Subtherapeutic 9/19 @ 0022 aPTT 63, therapeutic x 1  Goal of Therapy:  aPTT 50-90 seconds Monitor platelets by anticoagulation protocol: Yes   Plan:  Continue argatroban at current rate (0.58mcg/Kg/min) Check aPTT in 2h to confirm CBC in AM  Pharmacy will continue to follow.   Valrie Hart A 12/06/2019,1:01 AM

## 2019-12-06 NOTE — Plan of Care (Signed)

## 2019-12-06 NOTE — Progress Notes (Signed)
ANTICOAGULATION CONSULT NOTE - Pharmacy Consult for Argatroban Indication: r/o HIT  Allergies  Allergen Reactions  . Heparin     Ruling out HIT    Patient Measurements: Height: 5' 10.98" (180.3 cm) Weight: 89.5 kg (197 lb 5 oz) IBW/kg (Calculated) : 75.26   Vital Signs: Temp: 97.2 F (36.2 C) (09/19 1100) Temp Source: Rectal (09/19 0900) BP: 133/83 (09/19 1100) Pulse Rate: 73 (09/19 1100)  Labs: Recent Labs    12/04/19 0455 12/04/19 0455 12/05/19 0351 12/05/19 1707 12/05/19 1707 12/06/19 0022 12/06/19 0308 12/06/19 0541 12/06/19 1010  HGB 15.1   < > 15.2  --   --   --  13.9  --   --   HCT 46.2  --  47.9  --   --   --  44.5  --   --   PLT 147*  --  126*  --   --   --  100*  --   --   APTT  --   --   --  33   < > 63* 62*  --  61*  LABPROT  --   --   --  14.7  --   --   --   --   --   INR  --   --   --  1.2  --   --   --   --   --   CREATININE 1.11  --  1.07  --   --   --   --  0.81  --    < > = values in this interval not displayed.   Estimated Creatinine Clearance: 102 mL/min (by C-G formula based on SCr of 0.81 mg/dL).  Medical History: Past Medical History:  Diagnosis Date  . Chronic neck and back pain    Assessment: 59 yo male on enoxaparin Plt 295 on admission on 9/9 now 252>194>147>126 in the last few days with plt count 126 today.  9/18 @ 2101 aPTT 33. Subtherapeutic 9/19 @ 0022 aPTT 63, therapeutic x 1 9/19 @ 0308 aPTT 62, therapeutic x 2 9/19 @ 1010 aPTT 61, therapeutic x3  Goal of Therapy:  aPTT 50-90 seconds Monitor platelets by anticoagulation protocol: Yes   Plan:  Continue argatroban at current rate (0.65 mcg/Kg/min) Check aPTT in AM CBC in AM  Pharmacy will continue to follow.   Marty Heck 12/06/2019,12:16 PM

## 2019-12-06 NOTE — Progress Notes (Addendum)
Shift summary:  - Patient remains intubated, sedated, and proned.  - Urgent call received from [facility] RE pneumothorax on CXR. Dr. Belia Heman notified.  - Supine @ 1030 hrs; prone at 1830 hrs.

## 2019-12-06 NOTE — Progress Notes (Signed)
ANTICOAGULATION CONSULT NOTE - Pharmacy Consult for Argatroban Indication: r/o HIT  Allergies  Allergen Reactions  . Heparin     Ruling out HIT    Patient Measurements: Height: 5' 10.98" (180.3 cm) Weight: 89.5 kg (197 lb 5 oz) IBW/kg (Calculated) : 75.26   Vital Signs: Temp: 98.6 F (37 C) (09/19 0230) Temp Source: Rectal (09/19 0000) BP: 122/76 (09/19 0230) Pulse Rate: 88 (09/19 0230)  Labs: Recent Labs    12/03/19 3295 12/03/19 1884 12/04/19 0455 12/05/19 0351 12/05/19 1707 12/06/19 0022 12/06/19 0308  HGB 15.9   < > 15.1 15.2  --   --   --   HCT 49.0  --  46.2 47.9  --   --   --   PLT 194  --  147* 126*  --   --   --   APTT  --   --   --   --  33 63* 62*  LABPROT  --   --   --   --  14.7  --   --   INR  --   --   --   --  1.2  --   --   CREATININE 1.43*  --  1.11 1.07  --   --   --    < > = values in this interval not displayed.   Estimated Creatinine Clearance: 77.2 mL/min (by C-G formula based on SCr of 1.07 mg/dL).  Medical History: Past Medical History:  Diagnosis Date  . Chronic neck and back pain    Assessment: 59 yo male on enoxaparin Plt 295 on admission on 9/9 now 252>194>147>126 in the last few days with plt count 126 today.  9/18 @ 2101 aPTT 33. Subtherapeutic 9/19 @ 0022 aPTT 63, therapeutic x 1 9/19 @ 0308 aPTT 62, therapeutic x 2  Goal of Therapy:  aPTT 50-90 seconds Monitor platelets by anticoagulation protocol: Yes   Plan:  Continue argatroban at current rate (0.84mcg/Kg/min) Check aPTT in 4h  CBC in AM  Pharmacy will continue to follow.   Margo Aye, Mirian Casco A 12/06/2019,3:58 AM

## 2019-12-06 NOTE — Progress Notes (Signed)
Patients wife and daughter at bedside wearing proper PPE. Patient has generalized edema to BL hands and wife states that she was able to remove the ring from his hand. Patient wife states that she will take ring home.

## 2019-12-06 NOTE — Progress Notes (Signed)
CRITICAL CARE NOTE 59 y.o.malew/chronic neck and back pain wh[facility]ed [facility] 9[facility]21for issues with an incarcerated ventral hernia. In the process of being evaluated the patient was noted to by hypoxic and tested postive for COVID-19.Patient was admitted for management of acute respiratory failure with hypoxia due to COVID-19 pneumonia. Patient transferred to the ICU/stepdown on 27 November 2019 due to increasing FiO2 requirements.  9/14 progressive resp failure, patient decided to be placed on VENT 9/15 patient emergently intubated, RT CVL placed, LT FEM ART PLACED 9/16severe hypoxia, proned position 9/17 severe hypoxia, plan for proning today Wife Boneta Lucks has been updated daily 9/18 severe ARDS hypoxia, proned positioned, wife updated daily 9/19 remains proned, severe ARDS   CC  follow up respiratory failure  SUBJECTIVE Patient remains critically ill Prognosis is guarded   Vent Mode: PRVC FiO2 (%):  [70 %] 70 % Set Rate:  [28 bmp-30 bmp] 30 bmp Vt Set:  [550 mL] 550 mL PEEP:  [15 cmH20] 15 cmH20 Plateau Pressure:  [16 cmH20-37 cmH20] 37 cmH20   BP 128/77   Pulse 88   Temp 97.9 F (36.6 C)   Resp (!) 30   Ht 5' 10.98" (1.803 m)   Wt 89.5 kg   SpO2 98%   BMI 27.53 kg/m    I/O last 3 completed shifts: In: 4778.3 [I.V.:3274; NG/GT:654.3; IV Piggyback:849.9] Out: 2485 [Urine:2485] No intake/output data recorded.  SpO2: 98 % O2 Flow Rate (L/min): 55 L/min FiO2 (%): 70 %  Estimated body mass index is 27.53 kg/m as calculated from the following:   Height as of this encounter: 5' 10.98" (1.803 m).   Weight as of this encounter: 89.5 kg.  SIGNIFICANT EVENTS   REVIEW OF SYSTEMS  PATIENT IS UNABLE TO PROVIDE COMPLETE REVIEW OF SYSTEMS DUE TO SEVERE CRITICAL ILLNESS   Pressure Injury 12/05/19 Face Right Stage 2 -  Partial thickness loss of dermis presenting as a shallow open injury with a red, pink wound bed without slough. (Active)  12/05/19 0500   Location: Face  Location Orientation: Right  Staging: Stage 2 -  Partial thickness loss of dermis presenting as a shallow open injury with a red, pink wound bed without slough.  Wound Description (Comments):   Present on Admission: No    COVID-19 DISASTER DECLARATION:   FULL CONTACT PHYSICAL EXAMINATION WAS NOT POSSIBLE DUE TO TREATMENT OF COVID-19  AND CONSERVATION OF PERSONAL PROTECTIVE EQUIPMENT, LIMITED EXAM FINDINGS INCLUDE-   PHYSICAL EXAMINATION:  GENERAL:critically ill appearing, +resp distress NEUROLOGIC: obtunded, GCS<8   Patient assessed or the symptoms described in the history of present illness.  In the context of the Global COVID-19 pandemic, which necessitated consideration that the patient might be at risk for infection with the SARS-CoV-2 virus that causes COVID-19, Institutional protocols and algorithms that pertain to the evaluation of patients at risk for COVID-19 are in a state of rapid change based on information released by regulatory bodies including the CDC and federal and state organizations. These policies and algorithms were followed during the patient's care while in hospital.    MEDICATIONS: I have reviewed all medications and confirmed regimen as documented   CULTURE RESULTS   Recent Results (from the past 240 hour(s))  MRSA PCR Screening     Status: None   Collection Time: 11/27/19 12:31 PM   Specimen: Nasal Mucosa; Nasopharyngeal  Result Value Ref Range Status   MRSA by PCR NEGATIVE NEGATIVE Final    Comment:        The GeneXpert MRSA  Assay (FDA approved for NASAL specimens only), is one component of a comprehensive MRSA colonization surveillance program. It is not intended to diagnose MRSA infection nor to guide or monitor treatment for MRSA infections. Performed at [facility], [address] [place], [place] [address]           IMAGING    No results found.   Nutrition Status: Nutrition Problem: Inadequate  oral intake Etiology: inability to eat Signs/Symptoms: NPO status Interventions: Tube feeding, Prostat, MVI  CBC    Component Value Date/Time   WBC 12.6 (H) 12/06/2019 0308   RBC 4.72 12/06/2019 0308   HGB 13.9 12/06/2019 0308   HCT 44.5 12/06/2019 0308   PLT 100 (L) 12/06/2019 0308   MCV 94.3 12/06/2019 0308   MCH 29.4 12/06/2019 0308   MCHC 31.2 12/06/2019 0308   RDW 16.1 (H) 12/06/2019 0308   LYMPHSABS 0.2 (L) 12/06/2019 0308   MONOABS 0.4 12/06/2019 0308   EOSABS 0.0 12/06/2019 0308   BASOSABS 0.0 12/06/2019 0308   BMP Latest Ref Rng & Units 12/06/2019 12/05/2019 12/05/2019  Glucose 70 - 99 mg/dL 341(P) - 379(K)  BUN 8 - 23 mg/dL 24(O) - 97(D)  Creatinine 0.61 - 1.24 mg/dL 5.32 - 9.92  Sodium 426 - 145 mmol/L 143 - 140  Potassium 3.5 - 5.1 mmol/L 5.2(H) 5.7(H) 5.5(H)  Chloride 98 - 111 mmol/L 101 - 97(L)  CO2 22 - 32 mmol/L 35(H) - 34(H)  Calcium 8.9 - 10.3 mg/dL 8.3(M) - 8.4(L)      Indwelling Urinary Catheter continued, requirement due to   Reason to continue Indwelling Urinary Catheter strict Intake/Output monitoring for hemodynamic instability   Central Line/ continued, requirement due to  Reason to continue [employer] Monitoring of central venous pressure or other hemodynamic parameters and poor IV access   Ventilator continued, requirement due to severe respiratory failure   Ventilator Sedation RASS 0 to -2      ASSESSMENT AND PLAN SYNOPSIS  Acute hypoxemic respiratory failure due to COVID-19 pneumonia / ARDS Mechanical ventilation via ARDS protocol, target PRVC 6 cc/kg Wean PEEP and FiO2 as able Goal plateau pressure less than 30, driving pressure less than 15 Paralytics if necessary for vent synchrony, gas exchange Cycle prone positioning if necessary for oxygenation Deep sedation per PAD protocol, goal RASS -4, currently fentanyl, midazolam Diuresis as blood pressure and renal function can tolerate, goal CVP 5-8.   diuresis as tolerated based on  Kidney function VAP prevention order set Remdesivir + BARIC IV STEROIDS  Follow inflammatory markers: Ferritin, D-dimer, CRP, IL-6, LDH Vitamin C, zinc Plan to repeat and check resp cultures    Severe ACUTE Hypoxic and Hypercapnic Respiratory Failure -continue Full MV support -continue Bronchodilator Therapy -Wean Fio2 and PEEP as tolerated -VAP/VENT bundle implementation continue proning     NEUROLOGY Acute toxic metabolic encephalopathy, need for sedation Goal RASS -2 to -3  SHOCK-SEPSIS -use vasopressors to keep MAP>65 as needed -stress dose steroids   CARDIAC ICU monitoring  ID -continue IV abx as prescibed -follow up cultures  GI GI PROPHYLAXIS as indicated   DIET-->TF's as tolerated Constipation protocol as indicated  ENDO - will use ICU hypoglycemic\Hyperglycemia protocol if indicated     ELECTROLYTES -follow labs as needed -replace as needed -pharmacy consultation and following   DVT/GI PRX ordered and assessed TRANSFUSIONS AS NEEDED MONITOR FSBS I Assessed the need for Labs I Assessed the need for Foley I Assessed the need for Central Venous Line Family Discussion when available  I Assessed the need for Mobilization I made an Assessment of medications to be adjusted accordingly Safety Risk assessment completed   CASE DISCUSSED IN MULTIDISCIPLINARY ROUNDS WITH ICU TEAM  Critical Care Time devoted to patient care services described in this note is 35 minutes.   Overall, patient is critically ill, prognosis is guarded.  Patient with Multiorgan failure and at high risk for cardiac arrest and death.    Lucie Leather, M.D.  [name]  Medical Director [facility] Medical Director [facility] Cardio-Pulmonary Department

## 2019-12-07 ENCOUNTER — Inpatient Hospital Stay: Payer: HRSA Program

## 2019-12-07 DIAGNOSIS — R7401 Elevation of levels of liver transaminase levels: Secondary | ICD-10-CM

## 2019-12-07 LAB — RENAL FUNCTION PANEL
Albumin: 1.8 g/dL — ABNORMAL LOW (ref 3.5–5.0)
Anion gap: 9 (ref 5–15)
BUN: 46 mg/dL — ABNORMAL HIGH (ref 8–23)
CO2: 33 mmol/L — ABNORMAL HIGH (ref 22–32)
Calcium: 8.6 mg/dL — ABNORMAL LOW (ref 8.9–10.3)
Chloride: 102 mmol/L (ref 98–111)
Creatinine, Ser: 0.68 mg/dL (ref 0.61–1.24)
GFR calc Af Amer: >60 mL/min (ref >60)
GFR calc non Af Amer: >60 mL/min (ref >60)
Glucose, Bld: 115 mg/dL — ABNORMAL HIGH (ref 70–99)
Phosphorus: 2.5 mg/dL (ref 2.5–4.6)
Potassium: 5.3 mmol/L — ABNORMAL HIGH (ref 3.5–5.1)
Sodium: 144 mmol/L (ref 135–145)

## 2019-12-07 LAB — CBC WITH DIFFERENTIAL/PLATELET
Abs Immature Granulocytes: 0.35 10*3/uL — ABNORMAL HIGH (ref 0.00–0.07)
Basophils Absolute: 0 10*3/uL (ref 0.0–0.1)
Basophils Relative: 0 %
Eosinophils Absolute: 0 10*3/uL (ref 0.0–0.5)
Eosinophils Relative: 0 %
HCT: 43.5 % (ref 39.0–52.0)
Hemoglobin: 13.4 g/dL (ref 13.0–17.0)
Immature Granulocytes: 3 %
Lymphocytes Relative: 2 %
Lymphs Abs: 0.2 10*3/uL — ABNORMAL LOW (ref 0.7–4.0)
MCH: 29.1 pg (ref 26.0–34.0)
MCHC: 30.8 g/dL (ref 30.0–36.0)
MCV: 94.6 fL (ref 80.0–100.0)
Monocytes Absolute: 0.4 10*3/uL (ref 0.1–1.0)
Monocytes Relative: 3 %
Neutro Abs: 11.6 10*3/uL — ABNORMAL HIGH (ref 1.7–7.7)
Neutrophils Relative %: 92 %
Platelets: 82 10*3/uL — ABNORMAL LOW (ref 150–400)
RBC: 4.6 MIL/uL (ref 4.22–5.81)
RDW: 16.4 % — ABNORMAL HIGH (ref 11.5–15.5)
WBC: 12.6 10*3/uL — ABNORMAL HIGH (ref 4.0–10.5)
nRBC: 0 % (ref 0.0–0.2)

## 2019-12-07 LAB — COMPREHENSIVE METABOLIC PANEL
ALT: 77 U/L — ABNORMAL HIGH (ref 0–44)
AST: 65 U/L — ABNORMAL HIGH (ref 15–41)
Albumin: 1.9 g/dL — ABNORMAL LOW (ref 3.5–5.0)
Alkaline Phosphatase: 186 U/L — ABNORMAL HIGH (ref 38–126)
Anion gap: 7 (ref 5–15)
BUN: 45 mg/dL — ABNORMAL HIGH (ref 8–23)
CO2: 36 mmol/L — ABNORMAL HIGH (ref 22–32)
Calcium: 8.5 mg/dL — ABNORMAL LOW (ref 8.9–10.3)
Chloride: 102 mmol/L (ref 98–111)
Creatinine, Ser: 0.75 mg/dL (ref 0.61–1.24)
GFR calc Af Amer: >60 mL/min (ref >60)
GFR calc non Af Amer: >60 mL/min (ref >60)
Glucose, Bld: 115 mg/dL — ABNORMAL HIGH (ref 70–99)
Potassium: 5.2 mmol/L — ABNORMAL HIGH (ref 3.5–5.1)
Sodium: 145 mmol/L (ref 135–145)
Total Bilirubin: 0.7 mg/dL (ref 0.3–1.2)
Total Protein: 5.3 g/dL — ABNORMAL LOW (ref 6.5–8.1)

## 2019-12-07 LAB — GLUCOSE, CAPILLARY
Glucose-Capillary: 105 mg/dL — ABNORMAL HIGH (ref 70–99)
Glucose-Capillary: 107 mg/dL — ABNORMAL HIGH (ref 70–99)
Glucose-Capillary: 108 mg/dL — ABNORMAL HIGH (ref 70–99)
Glucose-Capillary: 110 mg/dL — ABNORMAL HIGH (ref 70–99)
Glucose-Capillary: 129 mg/dL — ABNORMAL HIGH (ref 70–99)
Glucose-Capillary: 130 mg/dL — ABNORMAL HIGH (ref 70–99)
Glucose-Capillary: 136 mg/dL — ABNORMAL HIGH (ref 70–99)
Glucose-Capillary: 96 mg/dL (ref 70–99)

## 2019-12-07 LAB — BLOOD GAS, ARTERIAL
Acid-Base Excess: 8.8 mmol/L — ABNORMAL HIGH (ref 0.0–2.0)
Bicarbonate: 36.4 mmol/L — ABNORMAL HIGH (ref 20.0–28.0)
FIO2: 0.7
MECHVT: 500 mL
O2 Saturation: 96.1 %
PEEP: 15 cmH2O
Patient temperature: 37
RATE: 30 resp/min
pCO2 arterial: 63 mmHg — ABNORMAL HIGH (ref 32.0–48.0)
pH, Arterial: 7.37 (ref 7.350–7.450)
pO2, Arterial: 85 mmHg (ref 83.0–108.0)

## 2019-12-07 LAB — APTT: aPTT: 59 seconds — ABNORMAL HIGH (ref 24–36)

## 2019-12-07 LAB — TRIGLYCERIDES: Triglycerides: 284 mg/dL — ABNORMAL HIGH (ref <150)

## 2019-12-07 LAB — MAGNESIUM: Magnesium: 2.4 mg/dL (ref 1.7–2.4)

## 2019-12-07 MED ORDER — VITAL AF 1.2 CAL PO LIQD
1000.0000 mL | ORAL | Status: DC
  Administered 2019-12-07 – 2019-12-10 (×3): 1000 mL

## 2019-12-07 MED ORDER — PROSOURCE TF PO LIQD
45.0000 mL | Freq: Two times a day (BID) | ORAL | Status: DC
  Administered 2019-12-07 – 2019-12-10 (×7): 45 mL
  Filled 2019-12-07: qty 45

## 2019-12-07 MED ORDER — INSULIN ASPART 100 UNIT/ML IV SOLN
10.0000 [IU] | Freq: Once | INTRAVENOUS | Status: AC
  Administered 2019-12-07: 10 [IU] via INTRAVENOUS
  Filled 2019-12-07: qty 0.1

## 2019-12-07 MED ORDER — MIDAZOLAM 50MG/50ML (1MG/ML) PREMIX INFUSION
0.5000 mg/h | INTRAVENOUS | Status: DC
  Filled 2019-12-07: qty 50

## 2019-12-07 MED ORDER — DEXTROSE 50 % IV SOLN
1.0000 | Freq: Once | INTRAVENOUS | Status: AC
  Administered 2019-12-07: 50 mL via INTRAVENOUS
  Filled 2019-12-07: qty 50

## 2019-12-07 MED ORDER — PROPOFOL 1000 MG/100ML IV EMUL
5.0000 ug/kg/min | INTRAVENOUS | Status: DC
  Administered 2019-12-07 (×2): 30 ug/kg/min via INTRAVENOUS
  Administered 2019-12-08 (×2): 40 ug/kg/min via INTRAVENOUS
  Administered 2019-12-08: 50 ug/kg/min via INTRAVENOUS
  Administered 2019-12-08: 40 ug/kg/min via INTRAVENOUS
  Administered 2019-12-09 – 2019-12-10 (×7): 50 ug/kg/min via INTRAVENOUS
  Administered 2019-12-10: 30 ug/kg/min via INTRAVENOUS
  Administered 2019-12-10 – 2019-12-11 (×11): 50 ug/kg/min via INTRAVENOUS
  Administered 2019-12-12: 40 ug/kg/min via INTRAVENOUS
  Administered 2019-12-12 – 2019-12-15 (×20): 50 ug/kg/min via INTRAVENOUS
  Filled 2019-12-07 (×51): qty 100

## 2019-12-07 NOTE — Progress Notes (Signed)
ANTICOAGULATION CONSULT NOTE - Pharmacy Consult for Argatroban Indication: r/o HIT  Allergies  Allergen Reactions   Heparin     Ruling out HIT   Patient Measurements: Height: 5' 10.98" (180.3 cm) Weight: 89.5 kg (197 lb 5 oz) IBW/kg (Calculated) : 75.26   Vital Signs: Temp: 98.1 F (36.7 C) (09/20 0500) Temp Source: Rectal (09/20 0000) BP: 102/67 (09/20 0500) Pulse Rate: 96 (09/20 0500)  Labs: Recent Labs     0000 12/05/19 0351 12/05/19 1707 12/06/19 0022 12/06/19 0308 12/06/19 0541 12/06/19 1010 12/07/19 0331  HGB   < > 15.2  --   --  13.9  --   --  13.4  HCT  --  47.9  --   --  44.5  --   --  43.5  PLT  --  126*  --   --  100*  --   --  82*  APTT  --   --  33   < > 62*  --  61* 59*  LABPROT  --   --  14.7  --   --   --   --   --   INR  --   --  1.2  --   --   --   --   --   CREATININE  --  1.07  --   --   --  0.81  --  0.68   < > = values in this interval not displayed.   Estimated Creatinine Clearance: 103.3 mL/min (by C-G formula based on SCr of 0.68 mg/dL).  Medical History: Past Medical History:  Diagnosis Date   Chronic neck and back pain    Assessment: 59 yo male on enoxaparin Plt 295 on admission on 9/9 now 252>194>147>126 in the last few days with plt count 126 today.  9/18 @ 2101 aPTT 33. Subtherapeutic 9/19 @ 0022 aPTT 63, therapeutic x 1 9/19 @ 0308 aPTT 62, therapeutic x 2 9/19 @ 1010 aPTT 61, therapeutic x3 9/20 @ 0331 aPTT 59, therapeutic x 4, PLTs continue to trend down, H/H stable  Goal of Therapy:  aPTT 50-90 seconds Monitor platelets by anticoagulation protocol: Yes   Plan:  Continue argatroban at current rate (0.65 mcg/Kg/min) Check aPTT daily CBC daily  Pharmacy will continue to follow.   Margo Aye, Venda Dice A 12/07/2019,5:13 AM

## 2019-12-07 NOTE — Progress Notes (Signed)
CRITICAL CARE NOTE 59 y.o.malew/chronic neck and back pain wh[facility]21for issues with an incarcerated ventral hernia. In the process of being evaluated the patient was noted to by hypoxic and tested postive for COVID-19.Patient was admitted for management of acute respiratory failure with hypoxia due to COVID-19 pneumonia. Patient transferred to the ICU/stepdown on 27 November 2019 due to increasing FiO2 requirements.  9/14 progressive resp failure, patient decided to be placed on VENT 9/15 patient emergently intubated, RT CVL placed, LT FEM ART PLACED 9/16severe hypoxia, proned position, PATIENT IS DNR 9/17 severe hypoxia, plan for proning today Wife Boneta Lucks has been updated daily 9/18 severe ARDS hypoxia, proned positioned, wife updated daily 9/19 remains proned, severe ARDS 9/20 ARDS, resp failure, severe ARDS, PRONED, wife updated daily     CC  follow up respiratory failure  SUBJECTIVE Patient remains critically ill Prognosis is guarded   BP 103/64   Pulse 98   Temp 98.1 F (36.7 C)   Resp (!) 28   Ht 5' 10.98" (1.803 m)   Wt 89.5 kg   SpO2 100%   BMI 27.53 kg/m    I/O last 3 completed shifts: In: 3098.8 [I.V.:2748.7; IV Piggyback:350.1] Out: 1875 [Urine:1565; Stool:310] No intake/output data recorded.  SpO2: 100 % O2 Flow Rate (L/min): 55 L/min FiO2 (%): 70 %  Estimated body mass index is 27.53 kg/m as calculated from the following:   Height as of this encounter: 5' 10.98" (1.803 m).   Weight as of this encounter: 89.5 kg.  SIGNIFICANT EVENTS   REVIEW OF SYSTEMS  PATIENT IS UNABLE TO PROVIDE COMPLETE REVIEW OF SYSTEMS DUE TO SEVERE CRITICAL ILLNESS   Pressure Injury 12/05/19 Jaw Right Stage 2 -  Partial thickness loss of dermis presenting as a shallow open injury with a red, pink wound bed without slough. (Active)  12/05/19 0500  Location: Jaw  Location Orientation: Right  Staging: Stage 2 -  Partial thickness loss of dermis  presenting as a shallow open injury with a red, pink wound bed without slough.  Wound Description (Comments):   Present on Admission: No     Pressure Injury 12/06/19 Face Right Stage 2 -  Partial thickness loss of dermis presenting as a shallow open injury with a red, pink wound bed without slough. (Active)  12/06/19 0730  Location: Face  Location Orientation: Right  Staging: Stage 2 -  Partial thickness loss of dermis presenting as a shallow open injury with a red, pink wound bed without slough.  Wound Description (Comments):   Present on Admission: No     Pressure Injury 12/06/19 Penis Right;Distal Deep Tissue Pressure Injury - Purple or maroon localized area of discolored intact skin or blood-filled blister due to damage of underlying soft tissue from pressure and/or shear. (Active)  12/06/19 1030  Location: Penis  Location Orientation: Right;Distal  Staging: Deep Tissue Pressure Injury - Purple or maroon localized area of discolored intact skin or blood-filled blister due to damage of underlying soft tissue from pressure and/or shear.  Wound Description (Comments):   Present on Admission:     COVID-19 DISASTER DECLARATION:   FULL CONTACT PHYSICAL EXAMINATION WAS NOT POSSIBLE DUE TO TREATMENT OF COVID-19  AND CONSERVATION OF PERSONAL PROTECTIVE EQUIPMENT, LIMITED EXAM FINDINGS INCLUDE-   PHYSICAL EXAMINATION:  GENERAL:critically ill appearing, +resp distress NEUROLOGIC: obtunded, GCS<8   Patient assessed or the symptoms described in the history of present illness.  In the context of the Global COVID-19 pandemic, which necessitated consideration that the patient might  be at risk for infection with the SARS-CoV-2 virus that causes COVID-19, Institutional protocols and algorithms that pertain to the evaluation of patients at risk for COVID-19 are in a state of rapid change based on information released by regulatory bodies including the CDC and federal and state organizations. These  policies and algorithms were followed during the patient's care while in hospital.    MEDICATIONS: I have reviewed all medications and confirmed regimen as documented   CULTURE RESULTS   Recent Results (from the past 240 hour(s))  MRSA PCR Screening     Status: None   Collection Time: 11/27/19 12:31 PM   Specimen: Nasal Mucosa; Nasopharyngeal  Result Value Ref Range Status   MRSA by PCR NEGATIVE NEGATIVE Final    Comment:        The GeneXpert MRSA Assay (FDA approved for NASAL specimens only), is one component of a comprehensive MRSA colonization surveillance program. It is not intended to diagnose MRSA infection nor to guide or monitor treatment for MRSA infections. Performed at [facility], [address] [place], [place] [address]           Nanci Pina Abd 1 View  Addendum Date: 12/06/2019   ADDENDUM REPORT: 12/06/2019 17:12 ADDENDUM: Results were discussed with Dr. Belia Heman at 4:13 p.m. [place] on December 06, 2019. Electronically Signed   By: Aram Candela M.D.   On: 12/06/2019 17:12   Result Date: 12/06/2019 CLINICAL DATA:  Orogastric tube placement. EXAM: ABDOMEN - 1 VIEW COMPARISON:  December 04, 2019 and December 02, 2019. FINDINGS: Marked severity infiltrates are again seen throughout the visualized portion of the bilateral lung bases with marked severity pneumomediastinum. A nasogastric tube is noted with its distal tip overlying the body of the stomach. This is approximately 5.7 cm distal to the expected region of the gastroesophageal junction. The bowel gas pattern is normal. A thin (approximately 2.5 mm) area of linear lucency is seen overlying the medial aspect of the left lung base and left abdomen. This is adjacent to the lateral aspect of the descending thoracic aorta and abdominal aorta. A much smaller, thin lucent area is seen within this region on the prior study. A more prominent area of lucency is seen along the medial aspect of the left  upper quadrant and mid left abdomen. Radiopaque surgical clips are seen overlying the right upper quadrant. No radio-opaque calculi or other significant radiographic abnormality are seen. IMPRESSION: 1. Moderate to marked severity pneumomediastinum within the visualized portion of the lungs, with additional findings consistent with extension of air into the mid and upper abdomen. Correlation with abdomen pelvis CT is recommended. 2. Nasogastric tube positioning, as described above. Further advancement of the NG tube by approximately 6 cm is recommended to decrease the risk of aspiration. Electronically Signed: By: Aram Candela M.D. On: 12/06/2019 16:08   DG Chest Port 1 View  Result Date: 12/06/2019 CLINICAL DATA:  ETT placement EXAM: PORTABLE CHEST 1 VIEW COMPARISON:  12/06/2019, 12/05/2018 FINDINGS: Endotracheal tube with the tip 10 cm above the carina. Nasogastric tube coursing below the diaphragm. Right jugular central venous catheter with the tip projecting over the SVC. Diffuse bilateral patchy interstitial and alveolar airspace opacities. No pleural effusion or pneumothorax. Pneumomediastinum. Stable heart size. Extensive soft tissue subcutaneous emphysema along the right and left chest walls extending into the neck. No acute osseous abnormality. IMPRESSION: 1. Endotracheal tube with the tip 10 cm above the carina. 2. Bilateral interstitial and alveolar airspace opacities as  can be seen with multilobar pneumonia including atypical viral pneumonia. Pneumomediastinum again noted. 3. Extensive soft tissue subcutaneous emphysema along the right and left chest walls extending into the neck. Electronically Signed   By: Elige Ko   On: 12/06/2019 15:52   DG Chest Port 1 View  Result Date: 12/06/2019 CLINICAL DATA:  Endotracheal tube. EXAM: PORTABLE CHEST 1 VIEW COMPARISON:  12/06/2019 at 2:57 p.m. FINDINGS: Endotracheal tube has tip 5.5 cm above the carina. Enteric tube has tip over the stomach in  the left upper quadrant. Right IJ central venous catheter unchanged. Lungs are adequately inflated with moderate stable bilateral airspace opacification which may be due to multifocal infection versus edema. No significant effusion. Continued evidence of pneumomediastinum. Continued moderate diffuse subcutaneous emphysema throughout the thorax. Remainder the exam is unchanged. IMPRESSION: 1. Stable moderate bilateral airspace process which may be due to multifocal infection versus edema. 2. Stable moderate diffuse subcutaneous emphysema throughout the thorax. Stable pneumomediastinum. 3. Tubes and lines as described. Electronically Signed   By: Elberta Fortis M.D.   On: 12/06/2019 15:37     Nutrition Status: Nutrition Problem: Inadequate oral intake Etiology: inability to eat Signs/Symptoms: NPO status Interventions: Tube feeding, Prostat, MVI     Indwelling Urinary Catheter continued, requirement due to   Reason to continue Indwelling Urinary Catheter strict Intake/Output monitoring for hemodynamic instability   Central Line/ continued, requirement due to  Reason to continue [employer] Monitoring of central venous pressure or other hemodynamic parameters and poor IV access   Ventilator continued, requirement due to severe respiratory failure   Ventilator Sedation RASS 0 to -2      ASSESSMENT AND PLAN SYNOPSIS  Acute hypoxemic respiratory failure due to COVID-19 pneumonia / ARDS Mechanical ventilation via ARDS protocol, target PRVC 6 cc/kg Wean PEEP and FiO2 as able Goal plateau pressure less than 30, driving pressure less than 15 Paralytics if necessary for vent synchrony, gas exchange Cycle prone positioning if necessary for oxygenation Deep sedation per PAD protocol, goal RASS -4, currently fentanyl, midazolam Diuresis as blood pressure and renal function can tolerate, goal CVP 5-8.   diuresis as tolerated based on Kidney function VAP prevention order set IV STEROIDS     Severe ACUTE Hypoxic and Hypercapnic Respiratory Failure -continue Full MV support -continue Bronchodilator Therapy -Wean Fio2 and PEEP as tolerated -VAP/VENT bundle implementation   NEUROLOGY Acute toxic metabolic encephalopathy, need for sedation Goal RASS -2 to -3  SHOCK-SEPSIS -use vasopressors to keep MAP>65 as needed  CARDIAC ICU monitoring   GI GI PROPHYLAXIS as indicated   DIET-->TF's as tolerated Constipation protocol as indicated  ENDO - will use ICU hypoglycemic\Hyperglycemia protocol if indicated     ELECTROLYTES -follow labs as needed -replace as needed -pharmacy consultation and following   DVT/GI PRX ordered and assessed TRANSFUSIONS AS NEEDED MONITOR FSBS I Assessed the need for Labs I Assessed the need for Foley I Assessed the need for Central Venous Line Family Discussion when available I Assessed the need for Mobilization I made an Assessment of medications to be adjusted accordingly Safety Risk assessment completed   CASE DISCUSSED IN MULTIDISCIPLINARY ROUNDS WITH ICU TEAM  Critical Care Time devoted to patient care services described in this note is 35 minutes.   Overall, patient is critically ill, prognosis is guarded.  Patient with Multiorgan failure and at high risk for cardiac arrest and death.    Lucie Leather, M.D.  [name]  Medical Director [facility] Medical  Director [facility] Cardio-Pulmonary Department

## 2019-12-07 NOTE — Progress Notes (Signed)
Abnormal cxr results.  Dr. Belia Heman notified.  No orders received.

## 2019-12-07 NOTE — Progress Notes (Signed)
Yellow metal ring removed from left ring finger due to swelling of hands.  Ring placed in labeled specimen cup and placed on bedside table for wife to take home.

## 2019-12-07 NOTE — Progress Notes (Signed)
Patient shaking head back and forth after mouth care.  Noted increased work of breathing.  Patient given fentanyl 50 mcg bolus with not improvement noted. Vecuronium 10 mg IV given.

## 2019-12-07 NOTE — Progress Notes (Signed)
Nutrition Follow-up  DOCUMENTATION CODES:   Not applicable  INTERVENTION:  Initiate Vital AF 1.2 Cal at 20 mL/hr and advance by 15 mL/hr every 8 hours to goal rate of 65 mL/hr (1560 mL goal daily volume) per tube. Also provide PROSource 45 mL BID per tube. Goal regimen provides 1952 kcal, 139 grams of protein, 1264 mL H2O daily. With current propofol rate provides 2377 kcal daily.  Monitor magnesium, potassium, and phosphorus daily for at least 3 days, MD to replete as needed, as pt is at risk for refeeding syndrome.  NUTRITION DIAGNOSIS:   Inadequate oral intake related to inability to eat as evidenced by NPO status.  Ongoing.  GOAL:   Patient will meet greater than or equal to 90% of their needs  Progressing.  MONITOR:   Vent status, Labs, Weight trends, I & O's  REASON FOR ASSESSMENT:   Ventilator    ASSESSMENT:   59 year old male with chronic neck and back pain presented to [facility] on 9/2 for incarcerated ventral hernia and was found to have COVID-19.  9/10 transferred to ICU 9/15 intubated 9/17 initiated trickle TFs after RN advanced tube 9/18 TFs stopped as second vasopressor was added  Patient is currently intubated on ventilator support MV: 16.4 L/min Temp (24hrs), Avg:97.9 F (36.6 C), Min:96.3 F (35.7 C), Max:98.6 F (37 C)  Propofol: 16.1 ml/hr (425 kcal daily)  Medications reviewed and include: vitamin C 1000 mg daily, Colace 100 mg BID, famotidine, Solu-Cortef 50 mg Q6hrs IV, Novolog 0-15 units Q4hrs, Miralax, zinc sulfate 220 mg daily, argatroban, fentanyl gtt, norepinephrine gtt now stopped, propofol gtt.  Labs reviewed: CBG 130, Potassium 5.3, CO2 33, BUN 46.  I/O: 1040 mL UOP yesterday (0.5 mL/kg/hr)  Weight trend: 89.5 kg on 9/18; +1.1 kg from 9/10  Enteral Access: NGT placed 9/15; terminates in stomach per chest x-ray 9/19  Discussed with RN and on rounds. Plan is to resume tube feeds today.  Diet Order:   Diet Order            Diet  NPO time specified  Diet effective now                EDUCATION NEEDS:   No education needs have been identified at this time  Skin:  Skin Assessment: Skin Integrity Issues: Skin Integrity Issues:: Stage II, DTI DTI: penis (3cm x 2cm) Stage II: right jaw (2cm x 2cm); right face (0.3cm x 0.2cm)  Last BM:  12/07/2019 - small type 7  Height:   Ht Readings from Last 1 Encounters:  12/07/19 5' 10.98" (1.803 m)   Weight:   Wt Readings from Last 1 Encounters:  12/05/19 89.5 kg   Ideal Body Weight:  78.2 kg  BMI:  Body mass index is 27.53 kg/m.  Estimated Nutritional Needs:   Kcal:  2298  Protein:  135-145 grams  Fluid:  >/= 2.3 L/day  Felix Pacini, MS, RD, LDN Pager number available on Amion

## 2019-12-07 NOTE — Consult Note (Signed)
WOC Nurse Consult Note: Reason for Consult:3 Pressure injuries noted yesterday. Conversed with Bedside RN at entry to room, patient just proned. Two areas on head, one on right cheek that is very small and one on ear not measured. Area on penis. Wound type:pressure vs vascular infacts Pressure Injury POA: No Measurement: Right face:DTPI measuring 0.3cm x 0.2cm purple discoloration Penis: DTPI measuring 3cm x 2cm purple with blood-filled blister Right ear: to be measured by Bedside RN when able, Stage 2 partial thickness skin loss Wound bed:As noted above Drainage (amount, consistency, odor) Scant serous from right ear Periwound:intact, dry Dressing procedure/placement/frequency: Patient is in ICU on a mattress replacement with low air loss feature. Orders provided for areas affected. Xeroform gauze to penis, silicone foam to ear and face.  WOC nursing team will follow, seeing every 7-10 days and will remain available to this patient, the nursing and medical teams.. Thanks, Ladona Mow, MSN, RN, GNP, Hans Eden  Pager# [phone]

## 2019-12-07 NOTE — Progress Notes (Signed)
Vecuronium given prior to placing patient supine.

## 2019-12-07 NOTE — Progress Notes (Signed)
Patient turned from prone to supine position without complication.  VSS.

## 2019-12-08 LAB — CBC WITH DIFFERENTIAL/PLATELET
Abs Immature Granulocytes: 0.44 10*3/uL — ABNORMAL HIGH (ref 0.00–0.07)
Basophils Absolute: 0 10*3/uL (ref 0.0–0.1)
Basophils Relative: 0 %
Eosinophils Absolute: 0 10*3/uL (ref 0.0–0.5)
Eosinophils Relative: 0 %
HCT: 38.5 % — ABNORMAL LOW (ref 39.0–52.0)
Hemoglobin: 12 g/dL — ABNORMAL LOW (ref 13.0–17.0)
Immature Granulocytes: 4 %
Lymphocytes Relative: 2 %
Lymphs Abs: 0.2 10*3/uL — ABNORMAL LOW (ref 0.7–4.0)
MCH: 29.2 pg (ref 26.0–34.0)
MCHC: 31.2 g/dL (ref 30.0–36.0)
MCV: 93.7 fL (ref 80.0–100.0)
Monocytes Absolute: 0.4 10*3/uL (ref 0.1–1.0)
Monocytes Relative: 4 %
Neutro Abs: 9 10*3/uL — ABNORMAL HIGH (ref 1.7–7.7)
Neutrophils Relative %: 90 %
Platelets: 72 10*3/uL — ABNORMAL LOW (ref 150–400)
RBC: 4.11 MIL/uL — ABNORMAL LOW (ref 4.22–5.81)
RDW: 16.6 % — ABNORMAL HIGH (ref 11.5–15.5)
WBC: 10 10*3/uL (ref 4.0–10.5)
nRBC: 0.2 % (ref 0.0–0.2)

## 2019-12-08 LAB — GLUCOSE, CAPILLARY
Glucose-Capillary: 125 mg/dL — ABNORMAL HIGH (ref 70–99)
Glucose-Capillary: 135 mg/dL — ABNORMAL HIGH (ref 70–99)
Glucose-Capillary: 136 mg/dL — ABNORMAL HIGH (ref 70–99)
Glucose-Capillary: 150 mg/dL — ABNORMAL HIGH (ref 70–99)
Glucose-Capillary: 140 mg/dL — ABNORMAL HIGH (ref 70–99)
Glucose-Capillary: 145 mg/dL — ABNORMAL HIGH (ref 70–99)

## 2019-12-08 LAB — RENAL FUNCTION PANEL
Albumin: 1.8 g/dL — ABNORMAL LOW (ref 3.5–5.0)
Anion gap: 5 (ref 5–15)
BUN: 47 mg/dL — ABNORMAL HIGH (ref 8–23)
CO2: 35 mmol/L — ABNORMAL HIGH (ref 22–32)
Calcium: 8.4 mg/dL — ABNORMAL LOW (ref 8.9–10.3)
Chloride: 105 mmol/L (ref 98–111)
Creatinine, Ser: 0.78 mg/dL (ref 0.61–1.24)
GFR calc Af Amer: >60 mL/min (ref >60)
GFR calc non Af Amer: >60 mL/min (ref >60)
Glucose, Bld: 130 mg/dL — ABNORMAL HIGH (ref 70–99)
Phosphorus: 2.2 mg/dL — ABNORMAL LOW (ref 2.5–4.6)
Potassium: 5.3 mmol/L — ABNORMAL HIGH (ref 3.5–5.1)
Sodium: 145 mmol/L (ref 135–145)

## 2019-12-08 LAB — MAGNESIUM: Magnesium: 2.4 mg/dL (ref 1.7–2.4)

## 2019-12-08 LAB — APTT: aPTT: 58 seconds — ABNORMAL HIGH (ref 24–36)

## 2019-12-08 LAB — HEPARIN INDUCED PLATELET AB (HIT ANTIBODY): Heparin Induced Plt Ab: 0.068 OD (ref 0.000–0.400)

## 2019-12-08 MED ORDER — ARGATROBAN 50 MG/50ML IV SOLN
0.6500 ug/kg/min | INTRAVENOUS | Status: DC
  Administered 2019-12-08: 0.65 ug/kg/min via INTRAVENOUS
  Filled 2019-12-08 (×2): qty 50

## 2019-12-08 MED ORDER — SODIUM CHLORIDE 0.9 % IV SOLN: INTRAVENOUS | Status: DC

## 2019-12-08 MED FILL — Insulin Regular (Human) Inj 100 Unit/ML: INTRAMUSCULAR | Qty: 0.1 | Status: AC

## 2019-12-08 NOTE — Consult Note (Signed)
WOC Nurse wound follow up Wound type:Stage 2 pressure injury to right ear Measurement: 4cm x 1.5cm x 0.1cm Wound bed: rink, moist  Orders for silicone foam to wound had been placed previously on 12/07/19.   The assistance provided by Bedside RN in obtaining measurements today is appreciated.  WOC nursing team will follow seeing every 7-10 days, and will remain available to this patient, the nursing and medical teams.  Please re-consult if needed ibn between visits. Thanks, Ladona Mow, MSN, RN, GNP, Hans Eden  Pager# [phone]

## 2019-12-08 NOTE — Consult Note (Addendum)
WOC Nurse wound follow up Request for Bedside RN to measure and document Stage 2 pressure injury to right ear reentered for today.  It is noted that patient is critically ill with multiorgan system failure and guarded prognosis. WOC nurse acknowledges that this is a low priority request and will stand by.  WOC nursing team will follow, and will remain available to this patient, the nursing and medical teams.  Please re-consult if needed in between visits. Thanks, Ladona Mow, MSN, RN, GNP, Hans Eden  Pager# [phone]

## 2019-12-08 NOTE — Progress Notes (Signed)
Shift summary:  - Patient remains intubated and sedated. - No plans for proning today per MD.

## 2019-12-08 NOTE — Plan of Care (Signed)
  Problem: Education: Goal: Knowledge of General Education information will improve Description: Including pain rating scale, medication(s)/side effects and non-pharmacologic comfort measures Outcome: Not Progressing   Problem: Education: Goal: Knowledge of General Education information will improve Description: Including pain rating scale, medication(s)/side effects and non-pharmacologic comfort measures Outcome: Not Progressing   Problem: Health Behavior/Discharge Planning: Goal: Ability to manage health-related needs will improve Outcome: Not Progressing   Problem: Clinical Measurements: Goal: Ability to maintain clinical measurements within normal limits will improve Outcome: Not Progressing Goal: Will remain free from infection Outcome: Not Progressing Goal: Diagnostic test results will improve Outcome: Not Progressing Goal: Respiratory complications will improve Outcome: Not Progressing Goal: Cardiovascular complication will be avoided Outcome: Not Progressing   Problem: Activity: Goal: Risk for activity intolerance will decrease Outcome: Not Progressing   Problem: Nutrition: Goal: Adequate nutrition will be maintained Outcome: Not Progressing   Problem: Coping: Goal: Level of anxiety will decrease Outcome: Not Progressing   Problem: Elimination: Goal: Will not experience complications related to bowel motility Outcome: Not Progressing Goal: Will not experience complications related to urinary retention Outcome: Not Progressing   Problem: Pain Managment: Goal: General experience of comfort will improve Outcome: Not Progressing   Problem: Safety: Goal: Ability to remain free from injury will improve Outcome: Not Progressing   Problem: Skin Integrity: Goal: Risk for impaired skin integrity will decrease Outcome: Not Progressing   Problem: Education: Goal: Knowledge of risk factors and measures for prevention of condition will improve Outcome: Not  Progressing   Problem: Coping: Goal: Psychosocial and spiritual needs will be supported Outcome: Not Progressing   Problem: Respiratory: Goal: Will maintain a patent airway Outcome: Not Progressing Goal: Complications related to the disease process, condition or treatment will be avoided or minimized Outcome: Not Progressing

## 2019-12-08 NOTE — Progress Notes (Signed)
CRITICAL CARE NOTE  59 y.o.malew/chronic neck and back pain whopresented to [facility] 9/2/2021for issues with an incarcerated ventral hernia. In the process of being evaluated the patient was noted to by hypoxic and tested postive for COVID-19.Patient was admitted for management of acute respiratory failure with hypoxia due to COVID-19 pneumonia. Patient transferred to the ICU/stepdown on 27 November 2019 due to increasing FiO2 requirements.  9/14 progressive resp failure, patient decided to be placed on VENT 9/15 patient emergently intubated, RT CVL placed, LT FEM ART PLACED 9/16severe hypoxia, proned position, PATIENT IS DNR 9/17 severe hypoxia, plan for proning today Wife Boneta Lucks has been updated daily 9/18 severe ARDS hypoxia, proned positioned, wife updated daily 9/19 remains proned, severe ARDS 9/20 ARDS, resp failure, severe ARDS, PRONED, wife updated daily 9/21 severe ARDS, resp failure, severe hypoxia  CC  follow up respiratory failure   SUBJECTIVE Patient remains critically ill Prognosis is guarded  Vent Mode: PRVC FiO2 (%):  [60 %-70 %] 70 % Set Rate:  [30 bmp] 30 bmp Vt Set:  [550 mL] 550 mL PEEP:  [15 cmH20] 15 cmH20 BMP Latest Ref Rng & Units 12/08/2019 12/07/2019 12/07/2019  Glucose 70 - 99 mg/dL 810(F) 751(W) 258(N)  BUN 8 - 23 mg/dL 27(P) 82(U) 23(N)  Creatinine 0.61 - 1.24 mg/dL 3.61 4.43 1.54  Sodium 135 - 145 mmol/L 145 145 144  Potassium 3.5 - 5.1 mmol/L 5.3(H) 5.2(H) 5.3(H)  Chloride 98 - 111 mmol/L 105 102 102  CO2 22 - 32 mmol/L 35(H) 36(H) 33(H)  Calcium 8.9 - 10.3 mg/dL 0.0(Q) 6.7(Y) 1.9(J)    BP (!) 81/55   Pulse 97   Temp 98.1 F (36.7 C) (Rectal)   Resp (!) 30   Ht 5' 10.98" (1.803 m)   Wt 89.5 kg   SpO2 93%   BMI 27.53 kg/m    I/O last 3 completed shifts: In: 2208.1 [I.V.:2074.8; NG/GT:133.3] Out: 2175 [Urine:1975; Stool:200] No intake/output data recorded.  SpO2: 93 % O2 Flow Rate (L/min): 55 L/min FiO2 (%): 70 %  Estimated body  mass index is 27.53 kg/m as calculated from the following:   Height as of this encounter: 5' 10.98" (1.803 m).   Weight as of this encounter: 89.5 kg.  SIGNIFICANT EVENTS   REVIEW OF SYSTEMS  PATIENT IS UNABLE TO PROVIDE COMPLETE REVIEW OF SYSTEMS DUE TO SEVERE CRITICAL ILLNESS   Pressure Injury 12/05/19 Jaw Right Stage 2 -  Partial thickness loss of dermis presenting as a shallow open injury with a red, pink wound bed without slough. (Active)  12/05/19 0500  Location: Jaw  Location Orientation: Right  Staging: Stage 2 -  Partial thickness loss of dermis presenting as a shallow open injury with a red, pink wound bed without slough.  Wound Description (Comments):   Present on Admission: No     Pressure Injury 12/06/19 Face Right Stage 2 -  Partial thickness loss of dermis presenting as a shallow open injury with a red, pink wound bed without slough. (Active)  12/06/19 0730  Location: Face  Location Orientation: Right  Staging: Stage 2 -  Partial thickness loss of dermis presenting as a shallow open injury with a red, pink wound bed without slough.  Wound Description (Comments):   Present on Admission: No     Pressure Injury 12/06/19 Penis Right;Distal Deep Tissue Pressure Injury - Purple or maroon localized area of discolored intact skin or blood-filled blister due to damage of underlying soft tissue from pressure and/or shear. (Active)  12/06/19 1030  Location: Penis  Location Orientation: Right;Distal  Staging: Deep Tissue Pressure Injury - Purple or maroon localized area of discolored intact skin or blood-filled blister due to damage of underlying soft tissue from pressure and/or shear.  Wound Description (Comments):   Present on Admission:     COVID-19 DISASTER DECLARATION:   FULL CONTACT PHYSICAL EXAMINATION WAS NOT POSSIBLE DUE TO TREATMENT OF COVID-19  AND CONSERVATION OF PERSONAL PROTECTIVE EQUIPMENT, LIMITED EXAM FINDINGS INCLUDE-   PHYSICAL  EXAMINATION:  GENERAL:critically ill appearing, +resp distress NEUROLOGIC: obtunded, GCS<8   Patient assessed or the symptoms described in the history of present illness.  In the context of the Global COVID-19 pandemic, which necessitated consideration that the patient might be at risk for infection with the SARS-CoV-2 virus that causes COVID-19, Institutional protocols and algorithms that pertain to the evaluation of patients at risk for COVID-19 are in a state of rapid change based on information released by regulatory bodies including the CDC and federal and state organizations. These policies and algorithms were followed during the patient's care while in hospital.     IMAGING    DG [facility] 1 View  Result Date: 12/07/2019 CLINICAL DATA:  59 year old male with a history of COVID, respiratory failure EXAM: PORTABLE CHEST 1 VIEW COMPARISON:  Multiple prior most recent 12/06/2019 FINDINGS: Cardiomediastinal silhouette unchanged. Unchanged endotracheal tube terminating at the level of the clavicular heads. Unchanged right IJ central venous catheter. Unchanged gastric tube which terminates in the left upper quadrant. Extensive subcutaneous/myofacial gas of the bilateral chest wall. Lucencies at the interfaces the mediastinum, compatible with persisting mediastinal gas. Linear gas in the right upper quadrant, appears to be under right hemidiaphragm. This is new from the comparison. Similar appearance of the lungs with reticulonodular opacities. No pneumothorax. No pleural effusion. IMPRESSION: New linear gas projecting under the right hemidiaphragm, concerning for free abdominal/peritoneal air. These results were discussed by telephone at the time of interpretation on 12/07/2019 at 8:24 am with the nurse caring for the patient, Ms Rachael. Redemonstration mediastinal gas, bilateral chest wall myofacial/subcutaneous emphysema No pneumothorax. Similar appearance of the bilateral lungs with bilateral  mixed interstitial and airspace disease compatible with combination of acute and chronic changes Unchanged endotracheal tube, right IJ central venous catheter, and gastric tube Electronically Signed   By: Gilmer Mor D.O.   On: 12/07/2019 08:24     Nutrition Status: Nutrition Problem: Inadequate oral intake Etiology: inability to eat Signs/Symptoms: NPO status Interventions: Tube feeding, Prostat, MVI     Indwelling Urinary Catheter continued, requirement due to   Reason to continue Indwelling Urinary Catheter strict Intake/Output monitoring for hemodynamic instability   Central Line/ continued, requirement due to  Reason to continue [employer] Monitoring of central venous pressure or other hemodynamic parameters and poor IV access   Ventilator continued, requirement due to severe respiratory failure   Ventilator Sedation RASS 0 to -2      ASSESSMENT AND PLAN SYNOPSIS  Acute hypoxemic respiratory failure due to COVID-19 pneumonia / ARDS Mechanical ventilation via ARDS protocol, target PRVC 6 cc/kg Wean PEEP and FiO2 as able Goal plateau pressure less than 30, driving pressure less than 15 Paralytics if necessary for vent synchrony, gas exchange Cycle prone positioning if necessary for oxygenation Deep sedation per PAD protocol, goal RASS -4, currently fentanyl, midazolam Diuresis as blood pressure and renal function can tolerate, goal CVP 5-8.   diuresis as tolerated based on Kidney function VAP prevention order set IV STEROIDS     Severe ACUTE  Hypoxic and Hypercapnic Respiratory Failure -continue Full MV support -continue Bronchodilator Therapy -Wean Fio2 and PEEP as tolerated -VAP/VENT bundle implementation    NEUROLOGY Acute toxic metabolic encephalopathy, need for sedation Goal RASS -2 to -3  SHOCK-SEPSIS -use vasopressors to keep MAP>65 as needed   CARDIAC ICU monitoring  GI GI PROPHYLAXIS as indicated   DIET-->TF's as tolerated Constipation  protocol as indicated  ENDO - will use ICU hypoglycemic\Hyperglycemia protocol if indicated     ELECTROLYTES -follow labs as needed -replace as needed -pharmacy consultation and following   DVT/GI PRX ordered and assessed TRANSFUSIONS AS NEEDED MONITOR FSBS I Assessed the need for Labs I Assessed the need for Foley I Assessed the need for Central Venous Line Family Discussion when available I Assessed the need for Mobilization I made an Assessment of medications to be adjusted accordingly Safety Risk assessment completed   CASE DISCUSSED IN MULTIDISCIPLINARY ROUNDS WITH ICU TEAM  Critical Care Time devoted to patient care services described in this note is 45 minutes.   Overall, patient is critically ill, prognosis is guarded.  Patient with Multiorgan failure and at high risk for cardiac arrest and death.    Lucie Leather, M.D.  [name]  Medical Director [facility] Medical Director [facility] Cardio-Pulmonary Department

## 2019-12-08 NOTE — Progress Notes (Signed)
ANTICOAGULATION CONSULT NOTE - Pharmacy Consult for Argatroban Indication: r/o HIT  Allergies  Allergen Reactions  . Heparin     Ruling out HIT   Patient Measurements: Height: 5' 10.98" (180.3 cm) Weight: 89.5 kg (197 lb 5 oz) IBW/kg (Calculated) : 75.26   Vital Signs: Temp Source: Rectal (09/21 0400)  Labs: Recent Labs    12/05/19 1707 12/06/19 0022 12/06/19 0308 12/06/19 0308 12/06/19 0541 12/06/19 1010 12/07/19 0331 12/07/19 1126 12/08/19 0600  HGB  --   --  13.9   < >  --   --  13.4  --  12.0*  HCT  --   --  44.5  --   --   --  43.5  --  38.5*  PLT  --   --  100*  --   --   --  82*  --  72*  APTT 33   < > 62*  --    < > 61* 59*  --  58*  LABPROT 14.7  --   --   --   --   --   --   --   --   INR 1.2  --   --   --   --   --   --   --   --   CREATININE  --   --   --    < >   < >  --  0.68 0.75 0.78   < > = values in this interval not displayed.   Estimated Creatinine Clearance: 103.3 mL/min (by C-G formula based on SCr of 0.78 mg/dL).  Medical History: Past Medical History:  Diagnosis Date  . Chronic neck and back pain    Assessment: 59 yo male on enoxaparin Plt 295 on admission on 9/9 now 252>194>147>126 in the last few days with plt count 126 today.  9/18 @ 2101 aPTT 33. Subtherapeutic 9/19 @ 0022 aPTT 63, therapeutic x 1 9/19 @ 0308 aPTT 62, therapeutic x 2 9/19 @ 1010 aPTT 61, therapeutic x3 9/20 @ 0331 aPTT 59, therapeutic x 4, PLTs continue to trend down, H/H stable 9/21 @ 0600 aPTT 58, therapeutic x 5, plts continue to drop, H and H now low also,   Goal of Therapy:  aPTT 50-90 seconds Monitor platelets by anticoagulation protocol: Yes   Plan:  Pt may be bleeding internally since blood counts are steadily declining.  Will pass off to CCU RPh to discuss with MD Continue argatroban at current rate (0.65 mcg/Kg/min) Check aPTT daily CBC daily  Pharmacy will continue to follow.   Milisa Kimbell D 12/08/2019,7:21 AM

## 2019-12-09 ENCOUNTER — Inpatient Hospital Stay: Payer: HRSA Program

## 2019-12-09 LAB — MAGNESIUM: Magnesium: 2.3 mg/dL (ref 1.7–2.4)

## 2019-12-09 LAB — CBC WITH DIFFERENTIAL/PLATELET
Abs Immature Granulocytes: 0.45 10*3/uL — ABNORMAL HIGH (ref 0.00–0.07)
Basophils Absolute: 0 10*3/uL (ref 0.0–0.1)
Basophils Relative: 0 %
Eosinophils Absolute: 0.1 10*3/uL (ref 0.0–0.5)
Eosinophils Relative: 1 %
HCT: 37.5 % — ABNORMAL LOW (ref 39.0–52.0)
Hemoglobin: 11.6 g/dL — ABNORMAL LOW (ref 13.0–17.0)
Immature Granulocytes: 5 %
Lymphocytes Relative: 3 %
Lymphs Abs: 0.3 10*3/uL — ABNORMAL LOW (ref 0.7–4.0)
MCH: 29.5 pg (ref 26.0–34.0)
MCHC: 30.9 g/dL (ref 30.0–36.0)
MCV: 95.4 fL (ref 80.0–100.0)
Monocytes Absolute: 0.3 10*3/uL (ref 0.1–1.0)
Monocytes Relative: 4 %
Neutro Abs: 7.5 10*3/uL (ref 1.7–7.7)
Neutrophils Relative %: 87 %
Platelets: 72 10*3/uL — ABNORMAL LOW (ref 150–400)
RBC: 3.93 MIL/uL — ABNORMAL LOW (ref 4.22–5.81)
RDW: 16.4 % — ABNORMAL HIGH (ref 11.5–15.5)
WBC: 8.7 10*3/uL (ref 4.0–10.5)
nRBC: 0.3 % — ABNORMAL HIGH (ref 0.0–0.2)

## 2019-12-09 LAB — GLUCOSE, CAPILLARY
Glucose-Capillary: 111 mg/dL — ABNORMAL HIGH (ref 70–99)
Glucose-Capillary: 127 mg/dL — ABNORMAL HIGH (ref 70–99)
Glucose-Capillary: 139 mg/dL — ABNORMAL HIGH (ref 70–99)
Glucose-Capillary: 139 mg/dL — ABNORMAL HIGH (ref 70–99)
Glucose-Capillary: 135 mg/dL — ABNORMAL HIGH (ref 70–99)

## 2019-12-09 LAB — RENAL FUNCTION PANEL
Albumin: 1.8 g/dL — ABNORMAL LOW (ref 3.5–5.0)
Anion gap: 11 (ref 5–15)
BUN: 47 mg/dL — ABNORMAL HIGH (ref 8–23)
CO2: 33 mmol/L — ABNORMAL HIGH (ref 22–32)
Calcium: 8.5 mg/dL — ABNORMAL LOW (ref 8.9–10.3)
Chloride: 106 mmol/L (ref 98–111)
Creatinine, Ser: 0.64 mg/dL (ref 0.61–1.24)
GFR calc Af Amer: >60 mL/min (ref >60)
GFR calc non Af Amer: >60 mL/min (ref >60)
Glucose, Bld: 129 mg/dL — ABNORMAL HIGH (ref 70–99)
Phosphorus: 2.8 mg/dL (ref 2.5–4.6)
Potassium: 5 mmol/L (ref 3.5–5.1)
Sodium: 150 mmol/L — ABNORMAL HIGH (ref 135–145)

## 2019-12-09 MED ORDER — FREE WATER
100.0000 mL | Freq: Three times a day (TID) | Status: DC
  Administered 2019-12-09 – 2019-12-12 (×9): 100 mL

## 2019-12-09 NOTE — Plan of Care (Signed)
Patient proned at 1230, repositioned Q2H, airway maintained.  TF changed to ordered rate of 65 ml/hr from 45 ml/hr.  Free water of 100 ml Q4H started at 1400 today.  Skin care performed for all areas breakdown.  New area breakdown/possibly skin tear to left cheek noted when ETT removed for proning.  Foam applied.

## 2019-12-09 NOTE — Progress Notes (Addendum)
CRITICAL CARE NOTE  59 y.o.malew/chronic neck and back pain whopresented to [facility] 09/08/2021for issues with an incarcerated ventral hernia. In the process of being evaluated the patient was noted to by hypoxic and tested postive for COVID-19.Patient was admitted for management of acute respiratory failure with hypoxia due to COVID-19 pneumonia. Patient transferred to the ICU/stepdown on 27 November 2019 due to increasing FiO2 requirements.  9/14 progressive resp failure, patient decided to be placed on VENT 9/15 patient emergently intubated, RT CVL placed, LT FEM ART PLACED 9/16severe hypoxia, proned position, PATIENT IS DNR 9/17 severe hypoxia, plan for proning today Wife Boneta Lucks has been updated daily 9/18 severe ARDS hypoxia, proned positioned, wife updated daily 9/19 remains proned, severe ARDS 9/20 ARDS, resp failure, severe ARDS, PRONED, wife updated daily 9/21 severe ARDS, resp failure, severe hypoxia 9/22 plan for proning today, severe ARDS, severe Hypoxia  CC  follow up respiratory failure  SUBJECTIVE Patient remains critically ill Prognosis is guarded   Vent Mode: PRVC FiO2 (%):  [40 %-70 %] 70 % Set Rate:  [30 bmp] 30 bmp Vt Set:  [550 mL] 550 mL PEEP:  [15 cmH20] 15 cmH20   BP (!) 91/56   Pulse 81   Temp 98 F (36.7 C) (Axillary)   Resp (!) 30   Ht 5' 10.98" (1.803 m)   Wt 97 kg   SpO2 (!) 88%   BMI 29.84 kg/m    I/O last 3 completed shifts: In: 3508.1 [I.V.:1990.3; NG/GT:1517.8] Out: 2055 [Urine:1845; Stool:210] No intake/output data recorded.  SpO2: (!) 88 % O2 Flow Rate (L/min): 55 L/min FiO2 (%): 70 %  Estimated body mass index is 29.84 kg/m as calculated from the following:   Height as of this encounter: 5' 10.98" (1.803 m).   Weight as of this encounter: 97 kg.  SIGNIFICANT EVENTS   REVIEW OF SYSTEMS  PATIENT IS UNABLE TO PROVIDE COMPLETE REVIEW OF SYSTEMS DUE TO SEVERE CRITICAL ILLNESS   Pressure Injury 12/05/19 Jaw Right Stage 2 -   Partial thickness loss of dermis presenting as a shallow open injury with a red, pink wound bed without slough. (Active)  12/05/19 0500  Location: Jaw  Location Orientation: Right  Staging: Stage 2 -  Partial thickness loss of dermis presenting as a shallow open injury with a red, pink wound bed without slough.  Wound Description (Comments):   Present on Admission: No     Pressure Injury 12/06/19 Face Right Stage 2 -  Partial thickness loss of dermis presenting as a shallow open injury with a red, pink wound bed without slough. (Active)  12/06/19 0730  Location: Face  Location Orientation: Right  Staging: Stage 2 -  Partial thickness loss of dermis presenting as a shallow open injury with a red, pink wound bed without slough.  Wound Description (Comments):   Present on Admission: No     Pressure Injury 12/06/19 Penis Right;Distal Deep Tissue Pressure Injury - Purple or maroon localized area of discolored intact skin or blood-filled blister due to damage of underlying soft tissue from pressure and/or shear. (Active)  12/06/19 1030  Location: Penis  Location Orientation: Right;Distal  Staging: Deep Tissue Pressure Injury - Purple or maroon localized area of discolored intact skin or blood-filled blister due to damage of underlying soft tissue from pressure and/or shear.  Wound Description (Comments):   Present on Admission:      Pressure Injury 12/06/19 Ear Right Stage 2 -  Partial thickness loss of dermis presenting as a shallow open injury  with a red, pink wound bed without slough. (Active)  12/06/19 0800  Location: Ear  Location Orientation: Right  Staging: Stage 2 -  Partial thickness loss of dermis presenting as a shallow open injury with a red, pink wound bed without slough.  Wound Description (Comments):   Present on Admission: No    COVID-19 DISASTER DECLARATION:   FULL CONTACT PHYSICAL EXAMINATION WAS NOT POSSIBLE DUE TO TREATMENT OF COVID-19  AND CONSERVATION OF PERSONAL  PROTECTIVE EQUIPMENT, LIMITED EXAM FINDINGS INCLUDE-   PHYSICAL EXAMINATION:  GENERAL:critically ill appearing, +resp distress NEUROLOGIC: obtunded, GCS<8   Patient assessed or the symptoms described in the history of present illness.  In the context of the Global COVID-19 pandemic, which necessitated consideration that the patient might be at risk for infection with the SARS-CoV-2 virus that causes COVID-19, Institutional protocols and algorithms that pertain to the evaluation of patients at risk for COVID-19 are in a state of rapid change based on information released by regulatory bodies including the CDC and federal and state organizations. These policies and algorithms were followed during the patient's care while in hospital.   Indwelling Urinary Catheter continued, requirement due to   Reason to continue Indwelling Urinary Catheter strict Intake/Output monitoring for hemodynamic instability   Central Line/ continued, requirement due to  Reason to continue [employer] Monitoring of central venous pressure or other hemodynamic parameters and poor IV access   Ventilator continued, requirement due to severe respiratory failure   Ventilator Sedation RASS 0 to -2      ASSESSMENT AND PLAN SYNOPSIS  Acute hypoxemic respiratory failure due to COVID-19 pneumonia / ARDS Mechanical ventilation via ARDS protocol, target PRVC 6 cc/kg Wean PEEP and FiO2 as able Goal plateau pressure less than 30, driving pressure less than 15 Paralytics if necessary for vent synchrony, gas exchange Cycle prone positioning if necessary for oxygenation Deep sedation per PAD protocol, goal RASS -4, currently fentanyl, midazolam Diuresis as blood pressure and renal function can tolerate, goal CVP 5-8.   diuresis as tolerated based on Kidney function VAP prevention order set IV STEROIDS  Follow inflammatory markers: Ferritin, D-dimer, CRP, IL-6, LDH Vitamin C, zinc Plan to repeat and check resp cultures     Severe ACUTE Hypoxic and Hypercapnic Respiratory Failure -continue Full MV support -continue Bronchodilator Therapy -Wean Fio2 and PEEP as tolerated -VAP/VENT bundle implementation   NEUROLOGY Acute toxic metabolic encephalopathy, need for sedation Goal RASS -2 to -3  SHOCK-SEPSIS -use vasopressors to keep MAP>65 as needed   CARDIAC ICU monitoring  ID -continue IV abx as prescibed -follow up cultures  GI GI PROPHYLAXIS as indicated   DIET-->TF's as tolerated Constipation protocol as indicated  ENDO - will use ICU hypoglycemic\Hyperglycemia protocol if indicated     ELECTROLYTES -follow labs as needed -replace as needed -pharmacy consultation and following   DVT/GI PRX ordered and assessed TRANSFUSIONS AS NEEDED MONITOR FSBS I Assessed the need for Labs I Assessed the need for Foley I Assessed the need for Central Venous Line Family Discussion when available I Assessed the need for Mobilization I made an Assessment of medications to be adjusted accordingly Safety Risk assessment completed   CASE DISCUSSED IN MULTIDISCIPLINARY ROUNDS WITH ICU TEAM  Critical Care Time devoted to patient care services described in this note is 34 minutes.   Overall, patient is critically ill, prognosis is guarded.  Patient with Multiorgan failure and at high risk for cardiac arrest and death.   WIFE HAS BEEN UPDATED DAILY  Bynum Bellows  Mortimer Fries, M.D.  [facility]  Medical Director [place] Director [facility] Cardio-Pulmonary Department

## 2019-12-09 NOTE — Progress Notes (Signed)
Sedation turned off for WUA this AM, pt became hypertensive, tachypneic, mild tachycardia in low 100s within 10 minutes of initiation, he appeared to try to open his eyes on command, unable to follow other motor commands.  Sedation restarted at previous rates.

## 2019-12-10 LAB — BLOOD GAS, ARTERIAL
Acid-Base Excess: 9.6 mmol/L — ABNORMAL HIGH (ref 0.0–2.0)
Acid-Base Excess: 9 mmol/L — ABNORMAL HIGH (ref 0.0–2.0)
Bicarbonate: 39.4 mmol/L — ABNORMAL HIGH (ref 20.0–28.0)
Bicarbonate: 39.5 mmol/L — ABNORMAL HIGH (ref 20.0–28.0)
FIO2: 0.7
FIO2: 0.7
MECHVT: 550 mL
MECHVT: 450 mL
Mechanical Rate: 30
O2 Saturation: 94.2 %
O2 Saturation: 91.1 %
PEEP: 15 cmH2O
PEEP: 15 cmH2O
Patient temperature: 36
Patient temperature: 37
RATE: 30 resp/min
RATE: 35 resp/min
pCO2 arterial: 77 mmHg (ref 32.0–48.0)
pCO2 arterial: 90 mmHg (ref 32.0–48.0)
pH, Arterial: 7.31 — ABNORMAL LOW (ref 7.350–7.450)
pH, Arterial: 7.25 — ABNORMAL LOW (ref 7.350–7.450)
pO2, Arterial: 74 mmHg — ABNORMAL LOW (ref 83.0–108.0)
pO2, Arterial: 71 mmHg — ABNORMAL LOW (ref 83.0–108.0)

## 2019-12-10 LAB — CBC WITH DIFFERENTIAL/PLATELET
Abs Immature Granulocytes: 0.49 10*3/uL — ABNORMAL HIGH (ref 0.00–0.07)
Basophils Absolute: 0 10*3/uL (ref 0.0–0.1)
Basophils Relative: 0 %
Eosinophils Absolute: 0.1 10*3/uL (ref 0.0–0.5)
Eosinophils Relative: 1 %
HCT: 38.8 % — ABNORMAL LOW (ref 39.0–52.0)
Hemoglobin: 11.9 g/dL — ABNORMAL LOW (ref 13.0–17.0)
Immature Granulocytes: 5 %
Lymphocytes Relative: 2 %
Lymphs Abs: 0.2 10*3/uL — ABNORMAL LOW (ref 0.7–4.0)
MCH: 29.8 pg (ref 26.0–34.0)
MCHC: 30.7 g/dL (ref 30.0–36.0)
MCV: 97 fL (ref 80.0–100.0)
Monocytes Absolute: 0.3 10*3/uL (ref 0.1–1.0)
Monocytes Relative: 3 %
Neutro Abs: 8.6 10*3/uL — ABNORMAL HIGH (ref 1.7–7.7)
Neutrophils Relative %: 89 %
Platelets: 80 10*3/uL — ABNORMAL LOW (ref 150–400)
RBC: 4 MIL/uL — ABNORMAL LOW (ref 4.22–5.81)
RDW: 16.9 % — ABNORMAL HIGH (ref 11.5–15.5)
WBC: 9.7 10*3/uL (ref 4.0–10.5)
nRBC: 0 % (ref 0.0–0.2)

## 2019-12-10 LAB — GLUCOSE, CAPILLARY
Glucose-Capillary: 110 mg/dL — ABNORMAL HIGH (ref 70–99)
Glucose-Capillary: 118 mg/dL — ABNORMAL HIGH (ref 70–99)
Glucose-Capillary: 126 mg/dL — ABNORMAL HIGH (ref 70–99)
Glucose-Capillary: 133 mg/dL — ABNORMAL HIGH (ref 70–99)
Glucose-Capillary: 134 mg/dL — ABNORMAL HIGH (ref 70–99)
Glucose-Capillary: 138 mg/dL — ABNORMAL HIGH (ref 70–99)
Glucose-Capillary: 140 mg/dL — ABNORMAL HIGH (ref 70–99)

## 2019-12-10 LAB — RENAL FUNCTION PANEL
Albumin: 1.9 g/dL — ABNORMAL LOW (ref 3.5–5.0)
Anion gap: 5 (ref 5–15)
BUN: 44 mg/dL — ABNORMAL HIGH (ref 8–23)
CO2: 37 mmol/L — ABNORMAL HIGH (ref 22–32)
Calcium: 8.7 mg/dL — ABNORMAL LOW (ref 8.9–10.3)
Chloride: 105 mmol/L (ref 98–111)
Creatinine, Ser: 0.55 mg/dL — ABNORMAL LOW (ref 0.61–1.24)
GFR calc Af Amer: >60 mL/min (ref >60)
GFR calc non Af Amer: >60 mL/min (ref >60)
Glucose, Bld: 154 mg/dL — ABNORMAL HIGH (ref 70–99)
Phosphorus: 3.7 mg/dL (ref 2.5–4.6)
Potassium: 5 mmol/L (ref 3.5–5.1)
Sodium: 147 mmol/L — ABNORMAL HIGH (ref 135–145)

## 2019-12-10 LAB — MAGNESIUM: Magnesium: 2 mg/dL (ref 1.7–2.4)

## 2019-12-10 MED ORDER — ACETAZOLAMIDE SODIUM 500 MG IJ SOLR
500.0000 mg | Freq: Once | INTRAMUSCULAR | Status: AC
  Administered 2019-12-10: 500 mg via INTRAVENOUS
  Filled 2019-12-10: qty 500

## 2019-12-10 MED ORDER — VITAL AF 1.2 CAL PO LIQD
1000.0000 mL | ORAL | Status: DC
  Administered 2019-12-11 – 2019-12-15 (×5): 1000 mL

## 2019-12-10 MED ORDER — STERILE WATER FOR INJECTION IJ SOLN
INTRAMUSCULAR | Status: AC
  Filled 2019-12-10: qty 10

## 2019-12-10 MED ORDER — PROSOURCE TF PO LIQD
90.0000 mL | Freq: Two times a day (BID) | ORAL | Status: DC
  Administered 2019-12-10 – 2019-12-20 (×21): 90 mL
  Filled 2019-12-10 (×22): qty 90

## 2019-12-10 MED ORDER — ALBUMIN HUMAN 25 % IV SOLN
25.0000 g | Freq: Two times a day (BID) | INTRAVENOUS | Status: AC
  Administered 2019-12-10 – 2019-12-12 (×6): 25 g via INTRAVENOUS
  Filled 2019-12-10 (×9): qty 100

## 2019-12-10 MED ORDER — DEXAMETHASONE SODIUM PHOSPHATE 10 MG/ML IJ SOLN
6.0000 mg | INTRAMUSCULAR | Status: DC
  Administered 2019-12-10 – 2019-12-17 (×8): 6 mg via INTRAVENOUS
  Filled 2019-12-10 (×8): qty 0.6

## 2019-12-10 NOTE — Progress Notes (Signed)
[name] Kitchen CRITICAL CARE NOTE 59 y.o.malew/chronic neck and back pain [facility]ed to [facility] September 03, 2021for issues with an incarcerated ventral hernia. In the process of being evaluated the patient was noted to be hypoxic and tested postive for COVID-19.Patient was admitted for management of acute respiratory failure with hypoxia due to COVID-19 pneumonia. Patient transferred to the ICU/stepdown on 27 November 2019 due to increasing FiO2 requirements.  SIGNIFICANT EVENTS/TEST 9/14 progressive resp failure, patient decided to be placed on VENT 9/15 patient emergently intubated, RT CVL placed, LT FEM ART PLACED 9/16severe hypoxia, proned position, PATIENT IS DNR 9/17 severe hypoxia, plan for proning today Wife Boneta Lucks has been updated daily 9/18 severe ARDS hypoxia, proned positioned, wife updated daily 9/19 remains proned, severe ARDS 9/20 ARDS, resp failure, severe ARDS, PRONED, wife updated daily 9/21 severe ARDS, resp failure, severe hypoxia 9/22 plan for proning today, severe ARDS, severe Hypoxia 0/23 pt unproned tolerating well with O2 sats @96 %  REVIEW OF SYSTEMS Unable to assess pt mechanically intubated   SUBJECTIVE: Unable to assess pt mechanically intubated   Vent Mode: PRVC FiO2 (%):  [40 %-70 %] 70 % Set Rate:  [30 bmp] 30 bmp Vt Set:  [550 mL] 550 mL PEEP:  [15 cmH20] 15 cmH20   BP (!) 107/59   Pulse 92   Temp (!) 96.8 F (36 C) (Esophageal)   Resp (!) 30   Ht 5' 10.98" (1.803 m)   Wt 98.7 kg   SpO2 96%   BMI 30.36 kg/m    I/O last 3 completed shifts: In: 5115.8 [I.V.:2143.1; NG/GT:2972.8] Out: 1905 [Urine:1395; Stool:510] Total I/O In: 455.3 [I.V.:455.3] Out: 500 [Urine:500]  SpO2: 96 % O2 Flow Rate (L/min): 55 L/min FiO2 (%): 70 %  Estimated body mass index is 30.36 kg/m as calculated from the following:   Height as of this encounter: 5' 10.98" (1.803 m).   Weight as of this encounter: 98.7 kg.  Pressure Injury 12/05/19 Jaw Right Stage 2 -  Partial  thickness loss of dermis presenting as a shallow open injury with a red, pink wound bed without slough. (Active)  12/05/19 0500  Location: Jaw  Location Orientation: Right  Staging: Stage 2 -  Partial thickness loss of dermis presenting as a shallow open injury with a red, pink wound bed without slough.  Wound Description (Comments):   Present on Admission: No     Pressure Injury 12/06/19 Face Right Stage 2 -  Partial thickness loss of dermis presenting as a shallow open injury with a red, pink wound bed without slough. (Active)  12/06/19 0730  Location: Face  Location Orientation: Right  Staging: Stage 2 -  Partial thickness loss of dermis presenting as a shallow open injury with a red, pink wound bed without slough.  Wound Description (Comments):   Present on Admission: No     Pressure Injury 12/06/19 Penis Right;Distal Deep Tissue Pressure Injury - Purple or maroon localized area of discolored intact skin or blood-filled blister due to damage of underlying soft tissue from pressure and/or shear. (Active)  12/06/19 1030  Location: Penis  Location Orientation: Right;Distal  Staging: Deep Tissue Pressure Injury - Purple or maroon localized area of discolored intact skin or blood-filled blister due to damage of underlying soft tissue from pressure and/or shear.  Wound Description (Comments):   Present on Admission:      Pressure Injury 12/06/19 Ear Right Stage 2 -  Partial thickness loss of dermis presenting as a shallow open injury with a red, pink wound bed without  slough. (Active)  12/06/19 0800  Location: Ear  Location Orientation: Right  Staging: Stage 2 -  Partial thickness loss of dermis presenting as a shallow open injury with a red, pink wound bed without slough.  Wound Description (Comments):   Present on Admission: No     Pressure Injury 12/09/19 Face Left Stage 2 -  Partial thickness loss of dermis presenting as a shallow open injury with a red, pink wound bed without  slough. skin tear under ETT holder adhesive device (Active)  12/09/19 1200  Location: Face  Location Orientation: Left  Staging: Stage 2 -  Partial thickness loss of dermis presenting as a shallow open injury with a red, pink wound bed without slough.  Wound Description (Comments): skin tear under ETT holder adhesive device  Present on Admission: No   PHYSICAL EXAMINATION: Physical Exam physical examination is limited due to need for PPE/CAPR GENERAL: Acutely ill appearing male, NAD mechanically intubated  HEAD: Normocephalic, atraumatic.  EYES: Pupils equal, round, sluggish to light.  No scleral icterus.  MOUTH: Oral mucosa moist NECK: Supple. Trachea midline. No JVD. PULMONARY: Coarse breath sounds throughout, even, non labored  CARDIOVASCULAR:Regular rate and rhythm noted on monitor.  ABDOMEN: Soft, nondistended, hypoactive bowel sounds, rectal tube in place with watery brown stools  MUSCULOSKELETAL: 1+ facial and generalized edema  NEUROLOGIC: when sedation decreased pt agitated, PERRL, not following commands  SKIN: facial pressure injuries    Indwelling Urinary Catheter continued, requirement due to   Reason to continue Indwelling Urinary Catheter strict Intake/Output monitoring for hemodynamic instability   Central Line/ continued, requirement due to  Reason to continue [employer] Monitoring of central venous pressure or other hemodynamic parameters and poor IV access   Ventilator continued, requirement due to severe respiratory failure   Ventilator Sedation RASS -1 to -2      ASSESSMENT AND PLAN SYNOPSIS  Acute hypoxemic respiratory failure due to COVID-19 pneumonia / ARDS Mechanical ventilation via ARDS protocol, target PRVC 6 cc/kg Wean PEEP and FiO2 as able Goal plateau pressure less than 30, driving pressure less than 15 Paralytics if necessary for vent synchrony, gas exchange Cycle prone positioning if necessary for oxygenation PAD protocol, goal RASS -1 to  -2, currently requiring fentanyl and propofol gtts  Diuresis as blood pressure and renal function can tolerate, goal CVP 5-8.   VAP prevention order set IV STEROIDS  Follow inflammatory markers: Ferritin, D-dimer, CRP, IL-6, LDH Vitamin C, zinc  Severe ACUTE Hypoxic and Hypercapnic Respiratory Failure -continue Full MV support -continue Bronchodilator Therapy -Wean Fio2 and PEEP as tolerated -VAP/VENT bundle implementation   NEUROLOGY Acute toxic metabolic encephalopathy, need for sedation Goal RASS -1 to -2  SHOCK-SEPSIS -use vasopressors to keep MAP>65 as needed  Thrombocytopenia  VTE px: SCD's, avoid chemical prophylaxis  Trend CBC Monitor for s/sx of bleeding   CARDIAC ICU monitoring  ID -continue IV abx as prescibed -follow up cultures  GI GI PROPHYLAXIS as indicated   DIET-->TF's as tolerated Constipation protocol as indicated  ENDO - will use ICU hypoglycemic\Hyperglycemia protocol if indicated  ELECTROLYTES -follow labs as needed -replace as needed -pharmacy consultation and following   DVT/GI PRX ordered and assessed TRANSFUSIONS AS NEEDED MONITOR FSBS I Assessed the need for Labs I Assessed the need for Foley I Assessed the need for Central Venous Line Family Discussion when available I Assessed the need for Mobilization I made an Assessment of medications to be adjusted accordingly Safety Risk assessment completed  Sonda Rumble, AGNP  Pulmonary/Critical  Care Pager [phone] (please enter 7 digits) PCCM Consult Pager [phone] (please enter 7 digits)

## 2019-12-10 NOTE — Progress Notes (Signed)
Pt placed in Supine position. Pt ETT secured 28cm at the lip. Edema throughout the face noted. Pt has skin breakdown on cheek areas, skin barriers replaced.

## 2019-12-10 NOTE — Progress Notes (Signed)
Nutrition Follow-up  DOCUMENTATION CODES:   Not applicable  INTERVENTION:  Initiate new goal TF regimen of Vital AF 1.2 Cal at 60 mL/hr (1440 mL goal daily volume) per tube + PROSource 90 mL BID per tube. Provides 1888 kcal, 152 grams of protein, 1166 mL H2O daily. With current propofol rate provides 2598 kcal daily.  NUTRITION DIAGNOSIS:   Inadequate oral intake related to inability to eat as evidenced by NPO status.  Ongoing.  GOAL:   Patient will meet greater than or equal to 90% of their needs  Met with TF regimen.  MONITOR:   Vent status, Labs, Weight trends, I & O's  REASON FOR ASSESSMENT:   Ventilator    ASSESSMENT:   59 year old male with chronic neck and back pain presented to [facility] on 9/2 for incarcerated ventral hernia and was found to have COVID-19.  9/10 transferred to ICU 9/15 intubated 9/17 initiated trickle TFs after RN advanced tube 9/18 TFs stopped as second vasopressor was added 9/20 TFs resumed and started advancing towards goal  Patient is currently intubated on ventilator support MV: 14.9 L/min Temp (24hrs), Avg:97.1 F (36.2 C), Min:95.3 F (35.2 C), Max:98.2 F (36.8 C)  Propofol: 26.9 ml/hr (710 kcal daily)  Medications reviewed and include: vitamin C 1000 mg daily per tube, Decadron 6 mg Q24hrs IV, Colace 100 mg BID per tube, famotidine, free water 100 mL Q8hrs, Novolog 0-15 units Q4hrs, Miralax, zinc sulfate 220 mg daily, fentanyl gtt, propofol gtt.  Labs reviewed: CBG 118-138, Sodium 147, CO2 37, BUN 44, Creatinine 0.55.  I/O: 1150 mL UOP yesterday (0.5 mL/kg/hr)  Weight trend: 98.7 kg on 9/23; +10.3 kg from 9/10; pt with generalized non-pitting edema per RN documentation  Enteral Access: OGT; terminates in stomach per chest x-ray 9/19  TF regimen: Vital AF 1.2 Cal at 65 mL/hr + PROsource 45 mL BID per tube  Discussed with RN and on rounds. Patient tolerating tube feeds at goal rate. Patient on proning protocol.  Diet Order:    Diet Order            Diet NPO time specified  Diet effective now                EDUCATION NEEDS:   No education needs have been identified at this time  Skin:  Skin Assessment: Skin Integrity Issues: Skin Integrity Issues:: DTI, Stage II DTI: penis (1cm x 1cm) Stage II: right jaw (3cm x 7cm); right face (1cm x 2cm); right ear (4cm x 4cm); left face (2cm x 3cm)  Last BM:  12/10/2019 - large type 7  Height:   Ht Readings from Last 1 Encounters:  12/07/19 5' 10.98" (1.803 m)   Weight:   Wt Readings from Last 1 Encounters:  12/10/19 98.7 kg   Ideal Body Weight:  78.2 kg  BMI:  Body mass index is 30.36 kg/m.  Estimated Nutritional Needs:   Kcal:  2564  Protein:  150-160 grams  Fluid:  >/= 2.3 L/day  Leanne King, MS, RD, LDN Pager number available on Amion 

## 2019-12-10 NOTE — Plan of Care (Signed)
  Problem: Education: Goal: Knowledge of General Education information will improve Description: Including pain rating scale, medication(s)/side effects and non-pharmacologic comfort measures Outcome: Not Progressing   Problem: Health Behavior/Discharge Planning: Goal: Ability to manage health-related needs will improve Outcome: Not Progressing   Problem: Clinical Measurements: Goal: Ability to maintain clinical measurements within normal limits will improve Outcome: Not Progressing Goal: Will remain free from infection Outcome: Not Progressing Goal: Diagnostic test results will improve Outcome: Not Progressing Goal: Respiratory complications will improve Outcome: Not Progressing Goal: Cardiovascular complication will be avoided Outcome: Not Progressing   Problem: Activity: Goal: Risk for activity intolerance will decrease Outcome: Not Progressing   Problem: Coping: Goal: Level of anxiety will decrease Outcome: Not Progressing   Problem: Elimination: Goal: Will not experience complications related to bowel motility Outcome: Not Progressing Goal: Will not experience complications related to urinary retention Outcome: Not Progressing   Problem: Pain Managment: Goal: General experience of comfort will improve Outcome: Not Progressing   Problem: Safety: Goal: Ability to remain free from injury will improve Outcome: Not Progressing   Problem: Skin Integrity: Goal: Risk for impaired skin integrity will decrease Outcome: Not Progressing   Problem: Education: Goal: Knowledge of risk factors and measures for prevention of condition will improve Outcome: Not Progressing   Problem: Coping: Goal: Psychosocial and spiritual needs will be supported Outcome: Not Progressing   Problem: Respiratory: Goal: Will maintain a patent airway Outcome: Not Progressing Goal: Complications related to the disease process, condition or treatment will be avoided or minimized Outcome: Not  Progressing

## 2019-12-10 NOTE — Progress Notes (Addendum)
Shift summary:  - Patient remains intubated and sedated.  - No measurable progress in respiratory status towards liberation from ventilator.   - Patient proned at 1330 hrs.

## 2019-12-11 ENCOUNTER — Inpatient Hospital Stay: Payer: HRSA Program

## 2019-12-11 ENCOUNTER — Inpatient Hospital Stay: Payer: Self-pay

## 2019-12-11 LAB — TROPONIN I (HIGH SENSITIVITY)
Troponin I (High Sensitivity): 28 ng/L — ABNORMAL HIGH (ref <18)
Troponin I (High Sensitivity): 28 ng/L — ABNORMAL HIGH (ref <18)

## 2019-12-11 LAB — RENAL FUNCTION PANEL
Albumin: 2.6 g/dL — ABNORMAL LOW (ref 3.5–5.0)
Anion gap: 4 — ABNORMAL LOW (ref 5–15)
BUN: 41 mg/dL — ABNORMAL HIGH (ref 8–23)
CO2: 39 mmol/L — ABNORMAL HIGH (ref 22–32)
Calcium: 8.7 mg/dL — ABNORMAL LOW (ref 8.9–10.3)
Chloride: 107 mmol/L (ref 98–111)
Creatinine, Ser: 0.57 mg/dL — ABNORMAL LOW (ref 0.61–1.24)
GFR calc Af Amer: >60 mL/min (ref >60)
GFR calc non Af Amer: >60 mL/min (ref >60)
Glucose, Bld: 120 mg/dL — ABNORMAL HIGH (ref 70–99)
Phosphorus: 3.3 mg/dL (ref 2.5–4.6)
Potassium: 4.8 mmol/L (ref 3.5–5.1)
Sodium: 150 mmol/L — ABNORMAL HIGH (ref 135–145)

## 2019-12-11 LAB — CBC WITH DIFFERENTIAL/PLATELET
Abs Immature Granulocytes: 0.49 10*3/uL — ABNORMAL HIGH (ref 0.00–0.07)
Basophils Absolute: 0 10*3/uL (ref 0.0–0.1)
Basophils Relative: 1 %
Eosinophils Absolute: 0.1 10*3/uL (ref 0.0–0.5)
Eosinophils Relative: 1 %
HCT: 38.2 % — ABNORMAL LOW (ref 39.0–52.0)
Hemoglobin: 11.4 g/dL — ABNORMAL LOW (ref 13.0–17.0)
Immature Granulocytes: 6 %
Lymphocytes Relative: 4 %
Lymphs Abs: 0.3 10*3/uL — ABNORMAL LOW (ref 0.7–4.0)
MCH: 29.2 pg (ref 26.0–34.0)
MCHC: 29.8 g/dL — ABNORMAL LOW (ref 30.0–36.0)
MCV: 97.9 fL (ref 80.0–100.0)
Monocytes Absolute: 0.4 10*3/uL (ref 0.1–1.0)
Monocytes Relative: 5 %
Neutro Abs: 7.4 10*3/uL (ref 1.7–7.7)
Neutrophils Relative %: 83 %
Platelets: 87 10*3/uL — ABNORMAL LOW (ref 150–400)
RBC: 3.9 MIL/uL — ABNORMAL LOW (ref 4.22–5.81)
RDW: 17.2 % — ABNORMAL HIGH (ref 11.5–15.5)
Smear Review: NORMAL
WBC: 8.7 10*3/uL (ref 4.0–10.5)
nRBC: 0.5 % — ABNORMAL HIGH (ref 0.0–0.2)

## 2019-12-11 LAB — GLUCOSE, CAPILLARY
Glucose-Capillary: 118 mg/dL — ABNORMAL HIGH (ref 70–99)
Glucose-Capillary: 104 mg/dL — ABNORMAL HIGH (ref 70–99)
Glucose-Capillary: 125 mg/dL — ABNORMAL HIGH (ref 70–99)
Glucose-Capillary: 136 mg/dL — ABNORMAL HIGH (ref 70–99)
Glucose-Capillary: 157 mg/dL — ABNORMAL HIGH (ref 70–99)
Glucose-Capillary: 173 mg/dL — ABNORMAL HIGH (ref 70–99)
Glucose-Capillary: 147 mg/dL — ABNORMAL HIGH (ref 70–99)

## 2019-12-11 LAB — MAGNESIUM: Magnesium: 2.5 mg/dL — ABNORMAL HIGH (ref 1.7–2.4)

## 2019-12-11 MED ORDER — SODIUM CHLORIDE 0.9% FLUSH
10.0000 mL | Freq: Two times a day (BID) | INTRAVENOUS | Status: DC
  Administered 2019-12-11 – 2019-12-15 (×7): 10 mL
  Administered 2019-12-15: 30 mL
  Administered 2019-12-16: 10 mL
  Administered 2019-12-16 – 2019-12-17 (×2): 30 mL
  Administered 2019-12-17 – 2019-12-20 (×7): 10 mL

## 2019-12-11 MED ORDER — ARGATROBAN 50 MG/50ML IV SOLN
0.6500 ug/kg/min | INTRAVENOUS | Status: DC
  Administered 2019-12-11: 0.5 ug/kg/min via INTRAVENOUS
  Filled 2019-12-11: qty 50

## 2019-12-11 MED ORDER — SODIUM CHLORIDE 0.9% FLUSH: 10.0000 mL | INTRAVENOUS | Status: DC | PRN

## 2019-12-11 NOTE — Progress Notes (Signed)
[name] Kitchen CRITICAL CARE NOTE 59 y.o.malew/chronic neck and back pain whop[facility]ed to [facility] 9/2/2021for issues with an incarcerated ventral hernia. In the process of being evaluated the patient was noted to be hypoxic and tested postive for COVID-19.Patient was admitted for management of acute respiratory failure with hypoxia due to COVID-19 pneumonia. Patient transferred to the ICU/stepdown on 27 November 2019 due to increasing FiO2 requirements.  SIGNIFICANT EVENTS/TEST 9/14 progressive resp failure, patient decided to be placed on VENT 9/15 patient emergently intubated, RT CVL placed, LT FEM ART PLACED 9/16severe hypoxia, proned position, PATIENT IS DNR 9/17 severe hypoxia, plan for proning today Wife [name] has been updated daily 9/18 severe ARDS hypoxia, proned positioned, wife updated daily 9/19 remains proned, severe ARDS 9/20 ARDS, resp failure, severe ARDS, PRONED, wife updated daily 9/21 severe ARDS, resp failure, severe hypoxia 9/22 plan for proning today, severe ARDS, severe Hypoxia 9/23 pt unproned tolerating well with O2 sats @96 % 9/24 continue cycling proning/supine position  REVIEW OF SYSTEMS Unable to assess pt intubated mechanically ventilated  SUBJECTIVE: Unable to assess pt intubated mechanically ventilated  Vent Mode: PRVC FiO2 (%):  [60 %-70 %] 60 % Set Rate:  [35 bmp] 35 bmp Vt Set:  [450 mL] 450 mL PEEP:  [15 cmH20] 15 cmH20 Plateau Pressure:  [33 cmH20] 33 cmH20   BP 113/64    Pulse (!) 101    Temp 98.5 F (36.9 C)    Resp (!) 31    Ht 5' 10.98" (1.803 m)    Wt 98.6 kg    SpO2 95%    BMI 30.33 kg/m    I/O last 3 completed shifts: In: 5524.8 [I.V.:2129.5; Other:600; TD/DU:2025; IV Piggyback:267.3] Out: 2225 [Urine:1900; Emesis/NG output:100; Stool:225] Total I/O In: 56.8 [I.V.:56.8] Out: 300 [Urine:300]  SpO2: 95 % O2 Flow Rate (L/min): 55 L/min FiO2 (%): 60 %  Estimated body mass index is 30.33 kg/m as calculated from the following:   Height as  of this encounter: 5' 10.98" (1.803 m).   Weight as of this encounter: 98.6 kg.  Pressure Injury 12/05/19 Jaw Right Stage 2 -  Partial thickness loss of dermis presenting as a shallow open injury with a red, pink wound bed without slough. (Active)  12/05/19 0500  Location: Jaw  Location Orientation: Right  Staging: Stage 2 -  Partial thickness loss of dermis presenting as a shallow open injury with a red, pink wound bed without slough.  Wound Description (Comments):   Present on Admission: No     Pressure Injury 12/06/19 Face Right Stage 2 -  Partial thickness loss of dermis presenting as a shallow open injury with a red, pink wound bed without slough. (Active)  12/06/19 0730  Location: Face  Location Orientation: Right  Staging: Stage 2 -  Partial thickness loss of dermis presenting as a shallow open injury with a red, pink wound bed without slough.  Wound Description (Comments):   Present on Admission: No     Pressure Injury 12/06/19 Penis Right;Distal Deep Tissue Pressure Injury - Purple or maroon localized area of discolored intact skin or blood-filled blister due to damage of underlying soft tissue from pressure and/or shear. (Active)  12/06/19 1030  Location: Penis  Location Orientation: Right;Distal  Staging: Deep Tissue Pressure Injury - Purple or maroon localized area of discolored intact skin or blood-filled blister due to damage of underlying soft tissue from pressure and/or shear.  Wound Description (Comments):   Present on Admission:      Pressure Injury 12/06/19 Ear Right  Stage 2 -  Partial thickness loss of dermis presenting as a shallow open injury with a red, pink wound bed without slough. (Active)  12/06/19 0800  Location: Ear  Location Orientation: Right  Staging: Stage 2 -  Partial thickness loss of dermis presenting as a shallow open injury with a red, pink wound bed without slough.  Wound Description (Comments):   Present on Admission: No     Pressure Injury  12/09/19 Face Left Stage 2 -  Partial thickness loss of dermis presenting as a shallow open injury with a red, pink wound bed without slough. skin tear under ETT holder adhesive device (Active)  12/09/19 1200  Location: Face  Location Orientation: Left  Staging: Stage 2 -  Partial thickness loss of dermis presenting as a shallow open injury with a red, pink wound bed without slough.  Wound Description (Comments): skin tear under ETT holder adhesive device  Present on Admission: No   PHYSICAL EXAMINATION: Physical Exam physical examination is limited due to need for PPE/CAPR GENERAL: Acutely ill appearing male, synchronous, mechanically ventilated HEAD: Normocephalic, atraumatic.  EYES: Pupils equal, round, sluggish to light.  No scleral icterus.  MOUTH: Orotracheally intubated, OG in place. NECK: Supple. Trachea midline. No JVD. PULMONARY: Coarse breath sounds throughout, even, non labored  CARDIOVASCULAR:Regular rate and rhythm noted on monitor.  ABDOMEN: Soft, nondistended, hypoactive bowel sounds, rectal tube in place with watery brown stools  MUSCULOSKELETAL: 1+ facial and generalized edema  NEUROLOGIC: when sedation decreased pt agitated, PERRL, not following commands  SKIN: facial pressure injuries, subcu emphysema, not increasing.   Scheduled Meds:  vitamin C  1,000 mg Per Tube Daily   chlorhexidine gluconate (MEDLINE KIT)  15 mL Mouth Rinse BID   Chlorhexidine Gluconate Cloth  6 each Topical Daily   dexamethasone (DECADRON) injection  6 mg Intravenous Q24H   docusate  100 mg Per Tube BID   famotidine  20 mg Per Tube BID   feeding supplement (PROSource TF)  90 mL Per Tube BID   free water  100 mL Per Tube Q8H   insulin aspart  0-15 Units Subcutaneous Q4H   mouth rinse  15 mL Mouth Rinse 10 times per day   polyethylene glycol  17 g Per Tube Daily   sodium chloride flush  10-40 mL Intracatheter Q12H   sodium chloride flush  3 mL Intravenous Q12H   traZODone   100 mg Per Tube QHS   zinc sulfate  220 mg Per Tube Daily   Continuous Infusions:  sodium chloride Stopped (12/11/19 1420)   albumin human 999 mL/hr at 12/11/19 1135   feeding supplement (VITAL AF 1.2 CAL) 1,000 mL (12/11/19 1446)   fentaNYL infusion INTRAVENOUS 300 mcg/hr (12/11/19 2000)   propofol (DIPRIVAN) infusion 50 mcg/kg/min (12/11/19 2000)   PRN Meds:.sodium chloride, acetaminophen, fentaNYL, guaiFENesin-dextromethorphan, ondansetron **OR** ondansetron (ZOFRAN) IV, sodium chloride flush, sodium chloride flush, sodium chloride flush, vecuronium      Indwelling Urinary Catheter continued, requirement due to   Reason to continue Indwelling Urinary Catheter strict Intake/Output monitoring for hemodynamic instability   Central Line/ DC central line, PICC line 9/24  Reason to continue [place] [employer] continued, requirement due to severe respiratory failure   Ventilator Sedation RASS -2 to -3      ASSESSMENT AND PLAN SYNOPSIS  Acute hypoxemic respiratory failure due to COVID-19 pneumonia / ARDS Mechanical ventilation via ARDS protocol, target PRVC 6 cc/kg Wean PEEP and FiO2 as able Goal plateau pressure less than 30,  driving pressure less than 15 Paralytics if necessary for vent synchrony, gas exchange Cycle prone positioning if necessary for oxygenation PAD protocol, goal RASS -2 to -3, currently requiring fentanyl and propofol gtts  Diuresis as blood pressure and renal function can tolerate, received Diamox x1 yesterday VAP prevention order set IV STEROIDS, continue Vitamin C, zinc    Acute toxic metabolic encephalopathy Ventilator discomfort sedation Goal RASS -2 to -3 Fentanyl/propofol  SHOCK Shock physiology has corrected  Thrombocytopenia  Platelets holding at 87K Due to severe critical illness Bone marrow suppression from viral illness Currently on SCDs for DVT prophylaxis  ID Monitor fever/WBCs Trend procalcitonin as  indicated  Prophylaxis DVT: SCDs GI: Famotidine   Severe protein calorie malnutrition Hypoalbuminemia Supplemental albumin Continue tube feeds Trend prealbumin  ENDO ICU hyperglycemia protocol   Multidisciplinary rounds were performed with the ICU team.  We will continue daily updates to wife, [name].  Total critical care time: 40 minutes   C. Derrill Kay, MD [place] [facility]   *This note was dictated using voice recognition software/Dragon.  Despite best efforts to proofread, errors can occur which can change the meaning.  Any change was purely unintentional.

## 2019-12-11 NOTE — Progress Notes (Signed)
Patients wife called to update chart with new phone number.  [phone]. Number placed in chart

## 2019-12-11 NOTE — Progress Notes (Addendum)
Shift summary:  - Patient remains intubated and sedated.  - Airborne/Contact precautions DC'd per Dr. Jayme Cloud.   - Consent for PICC obtained via telephone from patient's wife, Boneta Lucks. Witnessed by Sue Lush, RN.

## 2019-12-11 NOTE — Progress Notes (Signed)
Peripherally Inserted Central Catheter Placement  The IV Nurse has discussed with the patient and/or persons authorized to consent for the patient, the purpose of this procedure and the potential benefits and risks involved with this procedure.  The benefits include less needle sticks, lab draws from the catheter, and the patient may be discharged home with the catheter. Risks include, but not limited to, infection, bleeding, blood clot (thrombus formation), and puncture of an artery; nerve damage and irregular heartbeat and possibility to perform a PICC exchange if needed/ordered by physician.  Alternatives to this procedure were also discussed.  [employer] PICC patient education guide, fact sheet on infection prevention and patient information card has been provided to patient /or left at bedside.    PICC Placement Documentation  PICC Triple Lumen 12/11/19 PICC Right Basilic 43 cm 0 cm (Active)  Indication for Insertion or Continuance of Line Vasoactive infusions 12/11/19 1344  Exposed Catheter (cm) 0 cm 12/11/19 1344  Site Assessment Clean;Dry;Intact 12/11/19 1344  Lumen #1 Status Flushed;Saline locked;Blood return noted 12/11/19 1344  Lumen #2 Status Flushed;Saline locked;Blood return noted 12/11/19 1344  Lumen #3 Status Flushed;Saline locked;Blood return noted 12/11/19 1344  Dressing Type Transparent;Securing device 12/11/19 1344  Dressing Status Clean;Dry;Intact 12/11/19 1344  Antimicrobial disc in place? Yes 12/11/19 1344  Safety Lock Not Applicable 12/11/19 1344  Dressing Intervention New dressing 12/11/19 1344  Dressing Change Due 12/18/19 12/11/19 1344       Annett Fabian 12/11/2019, 1:48 PM

## 2019-12-11 NOTE — Plan of Care (Signed)
  Problem: Education: Goal: Knowledge of General Education information will improve Description: Including pain rating scale, medication(s)/side effects and non-pharmacologic comfort measures Outcome: Not Progressing   Problem: Health Behavior/Discharge Planning: Goal: Ability to manage health-related needs will improve Outcome: Not Progressing   Problem: Clinical Measurements: Goal: Ability to maintain clinical measurements within normal limits will improve Outcome: Not Progressing Goal: Will remain free from infection Outcome: Not Progressing Goal: Diagnostic test results will improve Outcome: Not Progressing Goal: Respiratory complications will improve Outcome: Not Progressing Goal: Cardiovascular complication will be avoided Outcome: Not Progressing   Problem: Activity: Goal: Risk for activity intolerance will decrease Outcome: Not Progressing   Problem: Coping: Goal: Level of anxiety will decrease Outcome: Not Progressing   Problem: Elimination: Goal: Will not experience complications related to bowel motility Outcome: Not Progressing Goal: Will not experience complications related to urinary retention Outcome: Not Progressing   Problem: Pain Managment: Goal: General experience of comfort will improve Outcome: Not Progressing   Problem: Safety: Goal: Ability to remain free from injury will improve Outcome: Not Progressing   Problem: Skin Integrity: Goal: Risk for impaired skin integrity will decrease Outcome: Not Progressing   Problem: Skin Integrity: Goal: Risk for impaired skin integrity will decrease Outcome: Not Progressing   Problem: Education: Goal: Knowledge of risk factors and measures for prevention of condition will improve Outcome: Not Progressing   Problem: Coping: Goal: Psychosocial and spiritual needs will be supported Outcome: Not Progressing   Problem: Respiratory: Goal: Will maintain a patent airway Outcome: Not Progressing Goal:  Complications related to the disease process, condition or treatment will be avoided or minimized Outcome: Not Progressing

## 2019-12-11 NOTE — Progress Notes (Signed)
CH at RN station charting when pt.'s wife and dtr. arrived to visit; CH briefly introduced himself and shared that [facility] and pt. had been able to have two meaningful conversations before pt. was intubated and pt. has been in [employer] prayers; family grateful; CH remains available as needed.

## 2019-12-11 NOTE — Progress Notes (Signed)
ANTICOAGULATION CONSULT NOTE - Initial Consult  Pharmacy Consult for Argatroban Indication: chest pain/ACS  No Known Allergies  Patient Measurements: Height: 5' 10.98" (180.3 cm) Weight: 98.6 kg (217 lb 6 oz) IBW/kg (Calculated) : 75.26 Heparin Dosing Weight:    Vital Signs: Temp: 98.5 F (36.9 C) (09/24 1934) Temp Source: Esophageal (09/24 1600) BP: 113/64 (09/24 2000) Pulse Rate: 101 (09/24 2000)  Labs: Recent Labs    12/09/19 0408 12/09/19 0408 12/10/19 0513 12/10/19 0645 12/11/19 0555 12/11/19 1842  HGB 11.6*   < >  --  11.9* 11.4*  --   HCT 37.5*  --   --  38.8* 38.2*  --   PLT 72*  --   --  80* 87*  --   CREATININE 0.64  --  0.55*  --  0.57*  --   TROPONINIHS  --   --   --   --   --  28*   < > = values in this interval not displayed.    Estimated Creatinine Clearance: 116 mL/min (A) (by C-G formula based on SCr of 0.57 mg/dL (L)).   Medical History: Past Medical History:  Diagnosis Date  . Chronic neck and back pain    Assessment: Patient is a 59yo male admitted for Covid. Pharmacy consulted for argatroban dosing for ACS. Patient with thrombocytopenia as well.  Goal of Therapy:  aPTT 50 - 90 seconds Monitor platelets by anticoagulation protocol: Yes   Plan:  Will order Argatroban 0.73mcg/kg/min. Will check aPTT 2 hours after start of infusion. Will follow nomogram for rate adjustments. CBC per protocol.  Rick Lowery, PharmD, BCPS 12/11/2019 10:05 PM

## 2019-12-11 NOTE — Progress Notes (Signed)
Pt was placed in supine position without incident. Pt face has edema and several skin tears. Pt ETT remains in place 28cm at lip, Spo2 remains in mid 90's.

## 2019-12-12 ENCOUNTER — Inpatient Hospital Stay: Payer: HRSA Program

## 2019-12-12 LAB — BLOOD GAS, ARTERIAL
Acid-Base Excess: 7.9 mmol/L — ABNORMAL HIGH (ref 0.0–2.0)
Bicarbonate: 38.1 mmol/L — ABNORMAL HIGH (ref 20.0–28.0)
FIO2: 60
MECHVT: 450 mL
O2 Saturation: 81 %
PEEP: 15 cmH2O
Patient temperature: 37
RATE: 35 resp/min
pCO2 arterial: 91 mmHg (ref 32.0–48.0)
pH, Arterial: 7.23 — ABNORMAL LOW (ref 7.350–7.450)
pO2, Arterial: 54 mmHg — ABNORMAL LOW (ref 83.0–108.0)

## 2019-12-12 LAB — GLUCOSE, CAPILLARY
Glucose-Capillary: 122 mg/dL — ABNORMAL HIGH (ref 70–99)
Glucose-Capillary: 126 mg/dL — ABNORMAL HIGH (ref 70–99)
Glucose-Capillary: 127 mg/dL — ABNORMAL HIGH (ref 70–99)
Glucose-Capillary: 134 mg/dL — ABNORMAL HIGH (ref 70–99)
Glucose-Capillary: 148 mg/dL — ABNORMAL HIGH (ref 70–99)
Glucose-Capillary: 160 mg/dL — ABNORMAL HIGH (ref 70–99)
Glucose-Capillary: 98 mg/dL (ref 70–99)

## 2019-12-12 LAB — APTT
aPTT: 50 seconds — ABNORMAL HIGH (ref 24–36)
aPTT: 50 seconds — ABNORMAL HIGH (ref 24–36)

## 2019-12-12 LAB — RENAL FUNCTION PANEL
Albumin: 3.1 g/dL — ABNORMAL LOW (ref 3.5–5.0)
Anion gap: 5 (ref 5–15)
BUN: 60 mg/dL — ABNORMAL HIGH (ref 8–23)
CO2: 37 mmol/L — ABNORMAL HIGH (ref 22–32)
Calcium: 9.1 mg/dL (ref 8.9–10.3)
Chloride: 107 mmol/L (ref 98–111)
Creatinine, Ser: 0.77 mg/dL (ref 0.61–1.24)
GFR calc Af Amer: >60 mL/min (ref >60)
GFR calc non Af Amer: >60 mL/min (ref >60)
Glucose, Bld: 145 mg/dL — ABNORMAL HIGH (ref 70–99)
Phosphorus: 4 mg/dL (ref 2.5–4.6)
Potassium: 5 mmol/L (ref 3.5–5.1)
Sodium: 149 mmol/L — ABNORMAL HIGH (ref 135–145)

## 2019-12-12 LAB — C-REACTIVE PROTEIN: CRP: 10.9 mg/dL — ABNORMAL HIGH (ref <1.0)

## 2019-12-12 LAB — CBC WITH DIFFERENTIAL/PLATELET
Abs Immature Granulocytes: 0.3 10*3/uL — ABNORMAL HIGH (ref 0.00–0.07)
Basophils Absolute: 0 10*3/uL (ref 0.0–0.1)
Basophils Relative: 0 %
Eosinophils Absolute: 0.1 10*3/uL (ref 0.0–0.5)
Eosinophils Relative: 1 %
HCT: 37.9 % — ABNORMAL LOW (ref 39.0–52.0)
Hemoglobin: 10.7 g/dL — ABNORMAL LOW (ref 13.0–17.0)
Immature Granulocytes: 3 %
Lymphocytes Relative: 3 %
Lymphs Abs: 0.3 10*3/uL — ABNORMAL LOW (ref 0.7–4.0)
MCH: 28.8 pg (ref 26.0–34.0)
MCHC: 28.2 g/dL — ABNORMAL LOW (ref 30.0–36.0)
MCV: 101.9 fL — ABNORMAL HIGH (ref 80.0–100.0)
Monocytes Absolute: 0.5 10*3/uL (ref 0.1–1.0)
Monocytes Relative: 5 %
Neutro Abs: 9.4 10*3/uL — ABNORMAL HIGH (ref 1.7–7.7)
Neutrophils Relative %: 88 %
Platelets: 99 10*3/uL — ABNORMAL LOW (ref 150–400)
RBC: 3.72 MIL/uL — ABNORMAL LOW (ref 4.22–5.81)
RDW: 17.3 % — ABNORMAL HIGH (ref 11.5–15.5)
WBC: 10.5 10*3/uL (ref 4.0–10.5)
nRBC: 0.5 % — ABNORMAL HIGH (ref 0.0–0.2)

## 2019-12-12 LAB — TROPONIN I (HIGH SENSITIVITY): Troponin I (High Sensitivity): 30 ng/L — ABNORMAL HIGH (ref <18)

## 2019-12-12 LAB — IMMATURE PLATELET FRACTION: Immature Platelet Fraction: 14.5 % — ABNORMAL HIGH (ref 1.2–8.6)

## 2019-12-12 LAB — FIBRIN DERIVATIVES D-DIMER (ARMC ONLY): Fibrin derivatives D-dimer (ARMC): >7500 ng/mL (FEU) — ABNORMAL HIGH (ref 0.00–499.00)

## 2019-12-12 LAB — MAGNESIUM: Magnesium: 2.5 mg/dL — ABNORMAL HIGH (ref 1.7–2.4)

## 2019-12-12 MED ORDER — FREE WATER
200.0000 mL | Status: DC
  Administered 2019-12-12 – 2019-12-18 (×34): 200 mL

## 2019-12-12 MED ORDER — FUROSEMIDE 10 MG/ML IJ SOLN
40.0000 mg | Freq: Once | INTRAMUSCULAR | Status: AC
  Administered 2019-12-12: 40 mg via INTRAVENOUS
  Filled 2019-12-12: qty 4

## 2019-12-12 NOTE — Progress Notes (Addendum)
Rick Lowery CRITICAL CARE NOTE 59 y.o.malew/chronic neck and back pain [facility] issues with an incarcerated ventral hernia. In the process of being evaluated the patient was noted to be hypoxic and tested postive for COVID-19.Patient was admitted for management of acute respiratory failure with hypoxia due to COVID-19 pneumonia. Patient transferred to the ICU/stepdown on 27 November 2019 due to increasing FiO2 requirements.  SIGNIFICANT EVENTS/TEST 9/14 progressive resp failure, patient decided to be placed on VENT 9/15 patient emergently intubated, RT CVL placed, LT FEM ART PLACED 9/16severe hypoxia, proned position, PATIENT IS DNR 9/17 severe hypoxia, plan for proning today Wife [name] has been updated daily 9/18 severe ARDS hypoxia, proned positioned, wife updated daily 9/19 remains proned, severe ARDS 9/20 ARDS, resp failure, severe ARDS, PRONED, wife updated daily 9/21 severe ARDS, resp failure, severe hypoxia 9/22 plan for proning today, severe ARDS, severe Hypoxia 9/23 pt unproned tolerating well with O2 sats _0 % 9/24 continue cycling proning/supine position 9/25 remains on vent, critically ill, ST changes noted, started on hep,+blood in foley  CC Follow up Resp failure/severe COVID  HPI remains critically ill Severe ARDS Severe hypoxia ST changes noted, started on hep,+blood in foley   REVIEW OF SYSTEMS  PATIENT IS UNABLE TO PROVIDE COMPLETE REVIEW OF SYSTEM S DUE TO SEVERE CRITICAL ILLNESS AND ENCEPHALOPATHY  Vent Mode: PRVC FiO2 (%):  [60 %-70 %] 60 % Set Rate:  [35 bmp] 35 bmp Vt Set:  [450 mL] 450 mL PEEP:  [15 cmH20] 15 cmH20 Plateau Pressure:  [33 cmH20] 33 cmH20   BP 120/64   Pulse 98   Temp 98.5 F (36.9 C)   Resp (!) 35   Ht 5' 10.98" (1.803 m)   Wt 98.6 kg   SpO2 94%   BMI 30.33 kg/m    I/O last 3 completed shifts: In: 5524.8 [I.V.:2129.5; Other:600; YQ/MV:7846; IV Piggyback:267.3] Out: 2225 [Urine:1900; Emesis/NG  output:100; Stool:225] Total I/O In: 527.7 [I.V.:441.9; IV Piggyback:85.8] Out: 300 [Urine:300]  SpO2: 94 % O2 Flow Rate (L/min): 55 L/min FiO2 (%): 60 %  Estimated body mass index is 30.33 kg/m as calculated from the following:   Height as of this encounter: 5' 10.98" (1.803 m).   Weight as of this encounter: 98.6 kg.  Pressure Injury 12/05/19 Jaw Right Stage 2 -  Partial thickness loss of dermis presenting as a shallow open injury with a red, pink wound bed without slough. (Active)  12/05/19 0500  Location: Jaw  Location Orientation: Right  Staging: Stage 2 -  Partial thickness loss of dermis presenting as a shallow open injury with a red, pink wound bed without slough.  Wound Description (Comments):   Present on Admission: No     Pressure Injury 12/06/19 Face Right Stage 2 -  Partial thickness loss of dermis presenting as a shallow open injury with a red, pink wound bed without slough. (Active)  12/06/19 0730  Location: Face  Location Orientation: Right  Staging: Stage 2 -  Partial thickness loss of dermis presenting as a shallow open injury with a red, pink wound bed without slough.  Wound Description (Comments):   Present on Admission: No     Pressure Injury 12/06/19 Penis Right;Distal Deep Tissue Pressure Injury - Purple or maroon localized area of discolored intact skin or blood-filled blister due to damage of underlying soft tissue from pressure and/or shear. (Active)  12/06/19 1030  Location: Penis  Location Orientation: Right;Distal  Staging: Deep Tissue Pressure Injury - Purple or maroon localized area of discolored  intact skin or blood-filled blister due to damage of underlying soft tissue from pressure and/or shear.  Wound Description (Comments):   Present on Admission:      Pressure Injury 12/06/19 Ear Right Stage 2 -  Partial thickness loss of dermis presenting as a shallow open injury with a red, pink wound bed without slough. (Active)  12/06/19 0800  Location:  Ear  Location Orientation: Right  Staging: Stage 2 -  Partial thickness loss of dermis presenting as a shallow open injury with a red, pink wound bed without slough.  Wound Description (Comments):   Present on Admission: No     Pressure Injury 12/09/19 Face Left Stage 2 -  Partial thickness loss of dermis presenting as a shallow open injury with a red, pink wound bed without slough. skin tear under ETT holder adhesive device (Active)  12/09/19 1200  Location: Face  Location Orientation: Left  Staging: Stage 2 -  Partial thickness loss of dermis presenting as a shallow open injury with a red, pink wound bed without slough.  Wound Description (Comments): skin tear under ETT holder adhesive device  Present on Admission: No   PHYSICAL EXAMINATION:  GENERAL:critically ill appearing, +resp distress NECK: Supple. No thyromegaly. No nodules. No JVD.  PULMONARY: +rhonchi, +wheezing CARDIOVASCULAR: S1 and S2. Regular rate and rhythm. No murmurs, rubs, or gallops.  GASTROINTESTINAL: Soft, nontender, -distended. Positive bowel sounds.  MUSCULOSKELETAL: No swelling, clubbing, or edema.  NEUROLOGIC: obtunded SKIN:+SUBQ emphyesema     Scheduled Meds: . vitamin C  1,000 mg Per Tube Daily  . chlorhexidine gluconate (MEDLINE KIT)  15 mL Mouth Rinse BID  . Chlorhexidine Gluconate Cloth  6 each Topical Daily  . dexamethasone (DECADRON) injection  6 mg Intravenous Q24H  . docusate  100 mg Per Tube BID  . famotidine  20 mg Per Tube BID  . feeding supplement (PROSource TF)  90 mL Per Tube BID  . free water  100 mL Per Tube Q8H  . insulin aspart  0-15 Units Subcutaneous Q4H  . mouth rinse  15 mL Mouth Rinse 10 times per day  . polyethylene glycol  17 g Per Tube Daily  . sodium chloride flush  10-40 mL Intracatheter Q12H  . sodium chloride flush  3 mL Intravenous Q12H  . traZODone  100 mg Per Tube QHS  . zinc sulfate  220 mg Per Tube Daily   Continuous Infusions: . sodium chloride Stopped  (12/11/19 1420)  . albumin human Stopped (12/11/19 2257)  . argatroban Stopped (12/12/19 0429)  . feeding supplement (VITAL AF 1.2 CAL) 60 mL/hr at 12/11/19 2300  . fentaNYL infusion INTRAVENOUS 300 mcg/hr (12/12/19 0300)  . propofol (DIPRIVAN) infusion 50 mcg/kg/min (12/12/19 0417)   PRN Meds:.sodium chloride, acetaminophen, fentaNYL, ondansetron **OR** ondansetron (ZOFRAN) IV, sodium chloride flush, sodium chloride flush, sodium chloride flush, vecuronium        Indwelling Urinary Catheter continued, requirement due to   Reason to continue Indwelling Urinary Catheter strict Intake/Output monitoring for hemodynamic instability   Central Line/ continued, requirement due to  Reason to continue [place] of central venous pressure or other hemodynamic parameters and poor IV access   Ventilator continued, requirement due to severe respiratory failure   Ventilator Sedation RASS 0 to -2       ASSESSMENT AND PLAN SYNOPSIS  Acute hypoxemic respiratory failure due to COVID-19 pneumonia / ARDS Severe COVID-19 infection, ARDS and pneumonia/pneumonitis Continue IV steroids  Mechanical ventilation via ARDS protocol, target PRVC 6 cc/kg Wean  PEEP and FiO2 as able Goal plateau pressure less than 30, driving pressure less than 15 Paralytics if necessary for vent synchrony, gas exchange Cycle prone positioning if necessary for oxygenation Deep sedation per PAD protocol, goal RASS -4, currently fentanyl, midazolam Diuresis as blood pressure and renal function can tolerate, goal CVP 5-8.   diuresis as tolerated based on Kidney function VAP prevention order set    ACUTE NSTEMI -oxygen as needed -Lasix as tolerated -follow up cardiac enzymes as indicated -follow up cardiology recs Check ECHO Hold blood thinners for now due to increased bleeding from foley    NEUROLOGY Acute toxic metabolic encephalopathy, need for sedation Goal RASS -2 to -3  Thrombocytopenia   Platelets holding at 87K Due to severe critical illness Bone marrow suppression from viral illness Currently on SCDs for DVT prophylaxis   GI GI PROPHYLAXIS as indicated  NUTRITIONAL STATUS DIET-->TF's as tolerated Constipation protocol as indicated Severe protein calorie malnutrition Hypoalbuminemia   ELECTROLYTES -follow labs as needed -replace as needed -pharmacy consultation and following   DVT/GI PRX ordered and assessed TRANSFUSIONS AS NEEDED MONITOR FSBS I Assessed the need for Labs I Assessed the need for Foley I Assessed the need for Central Venous Line Family Discussion when available I Assessed the need for Mobilization I made an Assessment of medications to be adjusted accordingly Safety Risk assessment completed   Critical Care Time devoted to patient care services described in this note is 32 minutes.   Overall, patient is critically ill, prognosis is guarded.  Patient with Multiorgan failure and at high risk for cardiac arrest and death.   Wean fio2 and vent as tolerated  Corrin Parker, M.D.  [facility]  Medical Director [place] Director [facility] Cardio-Pulmonary Department

## 2019-12-12 NOTE — Progress Notes (Signed)
ANTICOAGULATION CONSULT NOTE - Initial Consult  Pharmacy Consult for Argatroban Indication: chest pain/ACS  No Known Allergies  Patient Measurements: Height: 5' 10.98" (180.3 cm) Weight: 98.6 kg (217 lb 6 oz) IBW/kg (Calculated) : 75.26 Heparin Dosing Weight:    Vital Signs: Temp: 98.5 F (36.9 C) (09/24 1934) Temp Source: Esophageal (09/24 1600) BP: 114/63 (09/24 2200) Pulse Rate: 102 (09/24 2200)  Labs: Recent Labs    12/09/19 0408 12/09/19 0408 12/10/19 0513 12/10/19 0645 12/11/19 0555 12/11/19 1842 12/11/19 2200 12/12/19 0139  HGB 11.6*   < >  --  11.9* 11.4*  --   --   --   HCT 37.5*  --   --  38.8* 38.2*  --   --   --   PLT 72*  --   --  80* 87*  --   --   --   APTT  --   --   --   --   --   --   --  50*  CREATININE 0.64  --  0.55*  --  0.57*  --   --   --   TROPONINIHS  --   --   --   --   --  28* 28*  --    < > = values in this interval not displayed.    Estimated Creatinine Clearance: 116 mL/min (A) (by C-G formula based on SCr of 0.57 mg/dL (L)).   Medical History: Past Medical History:  Diagnosis Date  . Chronic neck and back pain    Assessment: Patient is a 59yo male admitted for Covid. Pharmacy consulted for argatroban dosing for ACS. Patient with thrombocytopenia as well.  Goal of Therapy:  aPTT 50 - 90 seconds Monitor platelets by anticoagulation protocol: Yes   Plan:  Will order Argatroban 0.108mcg/kg/min. Will check aPTT 2 hours after start of infusion. Will follow nomogram for rate adjustments. CBC per protocol.  9/25:  APTT @ 0139 = 50  Will continue pt on current rate and recheck aPTT in 2 hrs.  Kayin Osment D  12/12/2019 2:06 AM

## 2019-12-12 NOTE — Progress Notes (Signed)
Late note:  12/11/19 1315 Right forearm and hand swollen prior to PICC placement.

## 2019-12-12 NOTE — Progress Notes (Signed)
Pt was placed in supine position without  RT being present. Noted cuff leak on assessment of pt. And facial edema

## 2019-12-12 NOTE — Progress Notes (Signed)
ANTICOAGULATION CONSULT NOTE - Initial Consult  Pharmacy Consult for Argatroban Indication: chest pain/ACS  No Known Allergies  Patient Measurements: Height: 5' 10.98" (180.3 cm) Weight: 98.6 kg (217 lb 6 oz) IBW/kg (Calculated) : 75.26 Heparin Dosing Weight:    Vital Signs: Temp: 98.5 F (36.9 C) (09/24 1934) BP: 120/64 (09/25 0200) Pulse Rate: 98 (09/25 0200)  Labs: Recent Labs    12/10/19 0513 12/10/19 0645 12/10/19 0645 12/11/19 0555 12/11/19 1842 12/11/19 2200 12/12/19 0139 12/12/19 0442  HGB  --  11.9*   < > 11.4*  --   --   --  10.7*  HCT  --  38.8*  --  38.2*  --   --   --  37.9*  PLT  --  80*  --  87*  --   --   --  99*  APTT  --   --   --   --   --   --  50* 50*  CREATININE 0.55*  --   --  0.57*  --   --   --   --   TROPONINIHS  --   --   --   --  28* 28* 30*  --    < > = values in this interval not displayed.    Estimated Creatinine Clearance: 116 mL/min (A) (by C-G formula based on SCr of 0.57 mg/dL (L)).   Medical History: Past Medical History:  Diagnosis Date  . Chronic neck and back pain    Assessment: Patient is a 59yo male admitted for Covid. Pharmacy consulted for argatroban dosing for ACS. Patient with thrombocytopenia as well.  Goal of Therapy:  aPTT 50 - 90 seconds Monitor platelets by anticoagulation protocol: Yes   Plan:  Will order Argatroban 0.53mcg/kg/min. Will check aPTT 2 hours after start of infusion. Will follow nomogram for rate adjustments. CBC per protocol.  9/25:  APTT @ 0139 = 50  Will continue pt on current rate and recheck aPTT in 2 hrs.  9/25: APTT @ 0442 = 50 Will continue pt on current rate and recheck aPTT on 9/26 with AM labs.   Maricus Tanzi D  12/12/2019 5:09 AM

## 2019-12-13 LAB — APTT: aPTT: 32 seconds (ref 24–36)

## 2019-12-13 LAB — CBC WITH DIFFERENTIAL/PLATELET
Abs Immature Granulocytes: 0.16 10*3/uL — ABNORMAL HIGH (ref 0.00–0.07)
Basophils Absolute: 0 10*3/uL (ref 0.0–0.1)
Basophils Relative: 0 %
Eosinophils Absolute: 0.1 10*3/uL (ref 0.0–0.5)
Eosinophils Relative: 1 %
HCT: 31.3 % — ABNORMAL LOW (ref 39.0–52.0)
Hemoglobin: 9.7 g/dL — ABNORMAL LOW (ref 13.0–17.0)
Immature Granulocytes: 2 %
Lymphocytes Relative: 3 %
Lymphs Abs: 0.3 10*3/uL — ABNORMAL LOW (ref 0.7–4.0)
MCH: 29.8 pg (ref 26.0–34.0)
MCHC: 31 g/dL (ref 30.0–36.0)
MCV: 96.3 fL (ref 80.0–100.0)
Monocytes Absolute: 0.5 10*3/uL (ref 0.1–1.0)
Monocytes Relative: 5 %
Neutro Abs: 7.9 10*3/uL — ABNORMAL HIGH (ref 1.7–7.7)
Neutrophils Relative %: 89 %
Platelets: 110 10*3/uL — ABNORMAL LOW (ref 150–400)
RBC: 3.25 MIL/uL — ABNORMAL LOW (ref 4.22–5.81)
RDW: 17.6 % — ABNORMAL HIGH (ref 11.5–15.5)
WBC: 8.9 10*3/uL (ref 4.0–10.5)
nRBC: 0.2 % (ref 0.0–0.2)

## 2019-12-13 LAB — RENAL FUNCTION PANEL
Albumin: 3.1 g/dL — ABNORMAL LOW (ref 3.5–5.0)
Anion gap: 6 (ref 5–15)
BUN: 78 mg/dL — ABNORMAL HIGH (ref 8–23)
CO2: 38 mmol/L — ABNORMAL HIGH (ref 22–32)
Calcium: 9.4 mg/dL (ref 8.9–10.3)
Chloride: 105 mmol/L (ref 98–111)
Creatinine, Ser: 1.02 mg/dL (ref 0.61–1.24)
GFR calc Af Amer: >60 mL/min (ref >60)
GFR calc non Af Amer: >60 mL/min (ref >60)
Glucose, Bld: 130 mg/dL — ABNORMAL HIGH (ref 70–99)
Phosphorus: 3.4 mg/dL (ref 2.5–4.6)
Potassium: 4.9 mmol/L (ref 3.5–5.1)
Sodium: 149 mmol/L — ABNORMAL HIGH (ref 135–145)

## 2019-12-13 LAB — BLOOD GAS, ARTERIAL
Acid-Base Excess: 10.4 mmol/L — ABNORMAL HIGH (ref 0.0–2.0)
Bicarbonate: 39.5 mmol/L — ABNORMAL HIGH (ref 20.0–28.0)
FIO2: 60
MECHVT: 450 mL
Mechanical Rate: 35
O2 Saturation: 87.1 %
PEEP: 15 cmH2O
Patient temperature: 37
RATE: 35 resp/min
pCO2 arterial: 84 mmHg (ref 32.0–48.0)
pH, Arterial: 7.28 — ABNORMAL LOW (ref 7.350–7.450)
pO2, Arterial: 60 mmHg — ABNORMAL LOW (ref 83.0–108.0)

## 2019-12-13 LAB — GLUCOSE, CAPILLARY
Glucose-Capillary: 113 mg/dL — ABNORMAL HIGH (ref 70–99)
Glucose-Capillary: 126 mg/dL — ABNORMAL HIGH (ref 70–99)
Glucose-Capillary: 140 mg/dL — ABNORMAL HIGH (ref 70–99)
Glucose-Capillary: 146 mg/dL — ABNORMAL HIGH (ref 70–99)
Glucose-Capillary: 152 mg/dL — ABNORMAL HIGH (ref 70–99)

## 2019-12-13 LAB — MAGNESIUM: Magnesium: 2.5 mg/dL — ABNORMAL HIGH (ref 1.7–2.4)

## 2019-12-13 NOTE — Progress Notes (Signed)
ANTICOAGULATION CONSULT NOTE - Initial Consult  Pharmacy Consult for Argatroban Indication: chest pain/ACS  No Known Allergies  Patient Measurements: Height: 5' 10.98" (180.3 cm) Weight:  (bed scale malfunction) IBW/kg (Calculated) : 75.26 Heparin Dosing Weight:    Vital Signs: Temp: 98 F (36.7 C) (09/26 0536) Temp Source: Axillary (09/26 0536) BP: 110/60 (09/26 0600) Pulse Rate: 105 (09/26 0600)  Labs: Recent Labs    12/11/19 0555 12/11/19 0555 12/11/19 1842 12/11/19 2200 12/12/19 0139 12/12/19 0442 12/13/19 0506 12/13/19 0642  HGB 11.4*   < >  --   --   --  10.7*  --  9.7*  HCT 38.2*  --   --   --   --  37.9*  --  31.3*  PLT 87*  --   --   --   --  99*  --  110*  APTT  --   --   --   --  50* 50*  --  32  CREATININE 0.57*  --   --   --   --  0.77 1.02  --   TROPONINIHS  --   --  28* 28* 30*  --   --   --    < > = values in this interval not displayed.    Estimated Creatinine Clearance: 91 mL/min (by C-G formula based on SCr of 1.02 mg/dL).   Medical History: Past Medical History:  Diagnosis Date  . Chronic neck and back pain    Assessment: Patient is a 59yo male admitted for Covid. Pharmacy consulted for argatroban dosing for ACS. Patient with thrombocytopenia as well.  Goal of Therapy:  aPTT 50 - 90 seconds Monitor platelets by anticoagulation protocol: Yes   Plan:  Will order Argatroban 0.85mcg/kg/min. Will check aPTT 2 hours after start of infusion. Will follow nomogram for rate adjustments. CBC per protocol.  9/25:  APTT @ 0139 = 50  Will continue pt on current rate and recheck aPTT in 2 hrs.  9/25: APTT @ 0442 = 50 Will continue pt on current rate and recheck aPTT on 9/26 with AM labs.   9/26: APTT @ 0642 = 32 Will increase drip rate by 30% to 3.85 ml/hr and recheck aPTT 2 hrs after rate change.   Kierre Hintz D  12/13/2019 7:34 AM

## 2019-12-13 NOTE — Progress Notes (Signed)
Rick Lowery Kitchen CRITICAL CARE NOTE 59 y.o.malew/chronic neck and back pain whopresented to [facility] 9/2/2021for issues with an incarcerated ventral hernia. In the process of being evaluated the patient was noted to be hypoxic and tested postive for COVID-19.Patient was admitted for management of acute respiratory failure with hypoxia due to COVID-19 pneumonia. Patient transferred to the ICU/stepdown on 27 November 2019 due to increasing FiO2 requirements.  SIGNIFICANT EVENTS/TEST 9/14 progressive resp failure, patient decided to be placed on VENT 9/15 patient emergently intubated, RT CVL placed, LT FEM ART PLACED 9/16severe hypoxia, proned position, PATIENT IS DNR 9/17 severe hypoxia, plan for proning today Wife Rick Lowery has been updated daily 9/18 severe ARDS hypoxia, proned positioned, wife updated daily 9/19 remains proned, severe ARDS 9/20 ARDS, resp failure, severe ARDS, PRONED, wife updated daily 9/21 severe ARDS, resp failure, severe hypoxia 9/22 plan for proning today, severe ARDS, severe Hypoxia 9/23 pt unproned tolerating well with O2 sats _0 % 9/24 continue cycling proning/supine position  REVIEW OF SYSTEMS Unable to assess pt intubated mechanically ventilated  SUBJECTIVE: Unable to assess pt intubated mechanically ventilated  Vent Mode: PRVC FiO2 (%):  [60 %] 60 % Set Rate:  [35 bmp] 35 bmp Vt Set:  [450 mL] 450 mL PEEP:  [15 cmH20] 15 cmH20 Plateau Pressure:  [31 cmH20-33 cmH20] 31 cmH20   BP (!) 103/57    Pulse (!) 109    Temp (!) 97.4 F (36.3 C) (Axillary)    Resp (!) 33    Ht 5' 10.98" (1.803 m)    Wt 98.6 kg    SpO2 (!) 87%    BMI 30.33 kg/m    I/O last 3 completed shifts: In: 3648.8 [I.V.:1989.3; Other:600; NG/GT:710; IV Piggyback:349.5] Out: 2150 [Urine:1450; Stool:700] Total I/O In: 961.4 [I.V.:367.9; NG/GT:463.5; IV Piggyback:130] Out: 650 [Urine:650]  SpO2: (!) 87 % O2 Flow Rate (L/min): 55 L/min FiO2 (%): 60 %  Estimated body mass index is 30.33 kg/m as  calculated from the following:   Height as of this encounter: 5' 10.98" (1.803 m).   Weight as of this encounter: 98.6 kg.  Pressure Injury 12/05/19 Jaw Right Stage 2 -  Partial thickness loss of dermis presenting as a shallow open injury with a red, pink wound bed without slough. (Active)  12/05/19 0500  Location: Jaw  Location Orientation: Right  Staging: Stage 2 -  Partial thickness loss of dermis presenting as a shallow open injury with a red, pink wound bed without slough.  Wound Description (Comments):   Present on Admission: No     Pressure Injury 12/06/19 Face Right Stage 2 -  Partial thickness loss of dermis presenting as a shallow open injury with a red, pink wound bed without slough. (Active)  12/06/19 0730  Location: Face  Location Orientation: Right  Staging: Stage 2 -  Partial thickness loss of dermis presenting as a shallow open injury with a red, pink wound bed without slough.  Wound Description (Comments):   Present on Admission: No     Pressure Injury 12/06/19 Penis Right;Distal Deep Tissue Pressure Injury - Purple or maroon localized area of discolored intact skin or blood-filled blister due to damage of underlying soft tissue from pressure and/or shear. (Active)  12/06/19 1030  Location: Penis  Location Orientation: Right;Distal  Staging: Deep Tissue Pressure Injury - Purple or maroon localized area of discolored intact skin or blood-filled blister due to damage of underlying soft tissue from pressure and/or shear.  Wound Description (Comments):   Present on Admission:  Pressure Injury 12/06/19 Ear Right Stage 2 -  Partial thickness loss of dermis presenting as a shallow open injury with a red, pink wound bed without slough. (Active)  12/06/19 0800  Location: Ear  Location Orientation: Right  Staging: Stage 2 -  Partial thickness loss of dermis presenting as a shallow open injury with a red, pink wound bed without slough.  Wound Description (Comments):    Present on Admission: No     Pressure Injury 12/09/19 Face Left Stage 2 -  Partial thickness loss of dermis presenting as a shallow open injury with a red, pink wound bed without slough. skin tear under ETT holder adhesive device (Active)  12/09/19 1200  Location: Face  Location Orientation: Left  Staging: Stage 2 -  Partial thickness loss of dermis presenting as a shallow open injury with a red, pink wound bed without slough.  Wound Description (Comments): skin tear under ETT holder adhesive device  Present on Admission: No   PHYSICAL EXAMINATION: Physical Exam physical examination is limited due to need for PPE/CAPR GENERAL: Acutely ill appearing male, synchronous, mechanically ventilated HEAD: Normocephalic, atraumatic.  EYES: Pupils equal, round, sluggish to light.  No scleral icterus.  MOUTH: Orotracheally intubated, OG in place. NECK: Supple. Trachea midline. No JVD. PULMONARY: diminished breath sounds throughout, even, non labored, subcutaneous emphysema present bilateral upper chest  CARDIOVASCULAR:Regular rate and rhythm noted on monitor.  ABDOMEN: Soft, nondistended, hypoactive bowel sounds, rectal tube in place with watery brown stools  MUSCULOSKELETAL: 2+ generalized edema   NEUROLOGIC: sedated, not following commands, PERRL SKIN: facial pressure injuries, subcu emphysema, not increasing.   Scheduled Meds:  vitamin C  1,000 mg Per Tube Daily   chlorhexidine gluconate (MEDLINE KIT)  15 mL Mouth Rinse BID   Chlorhexidine Gluconate Cloth  6 each Topical Daily   dexamethasone (DECADRON) injection  6 mg Intravenous Q24H   docusate  100 mg Per Tube BID   famotidine  20 mg Per Tube BID   feeding supplement (PROSource TF)  90 mL Per Tube BID   free water  200 mL Per Tube Q4H   insulin aspart  0-15 Units Subcutaneous Q4H   mouth rinse  15 mL Mouth Rinse 10 times per day   polyethylene glycol  17 g Per Tube Daily   sodium chloride flush  10-40 mL Intracatheter  Q12H   traZODone  100 mg Per Tube QHS   zinc sulfate  220 mg Per Tube Daily   Continuous Infusions:  argatroban Stopped (12/12/19 0429)   feeding supplement (VITAL AF 1.2 CAL) 1,000 mL (12/12/19 1222)   fentaNYL infusion INTRAVENOUS 50 mcg/hr (12/13/19 0100)   propofol (DIPRIVAN) infusion 50 mcg/kg/min (12/13/19 0258)   PRN Meds:.acetaminophen, fentaNYL, ondansetron **OR** ondansetron (ZOFRAN) IV, sodium chloride flush, vecuronium      Indwelling Urinary Catheter continued, requirement due to   Reason to continue Indwelling Urinary Catheter strict Intake/Output monitoring for hemodynamic instability   Central Line/ DC central line, PICC line 9/24  Reason to continue [employer] continued, requirement due to severe respiratory failure   Ventilator Sedation RASS -2 to -3      ASSESSMENT AND PLAN SYNOPSIS  Acute hypoxemic respiratory failure due to COVID-19 pneumonia / ARDS Mechanical ventilation via ARDS protocol, target PRVC 6 cc/kg Wean PEEP and FiO2 as able Goal plateau pressure less than 30, driving pressure less than 15 Paralytics if necessary for vent synchrony, gas exchange Cycle prone positioning if necessary for oxygenation PAD protocol, goal RASS -  2 to -3, currently requiring fentanyl and propofol gtts  Diuresis as blood pressure and renal function can tolerate, received Diamox x1 yesterday VAP prevention order set IV STEROIDS, continue Vitamin C, zinc   Acute toxic metabolic encephalopathy Ventilator discomfort sedation Goal RASS -2 to -3 Fentanyl/propofol  SHOCK Shock physiology has corrected  Thrombocytopenia  Platelets holding at 99K Due to severe critical illness Bone marrow suppression from viral illness Currently on SCDs for DVT prophylaxis  ID Monitor fever/WBCs Trend procalcitonin as indicated  Prophylaxis DVT: SCDs GI: Famotidine   Severe protein calorie malnutrition Hypoalbuminemia Supplemental  albumin Continue tube feeds Trend prealbumin  ENDO ICU hyperglycemia protocol   Multidisciplinary rounds were performed with the ICU team.  We will continue daily updates to wife, Rick Lowery.   Marda Stalker, [place] Pager [phone] (please enter 7 digits) PCCM Consult Pager [phone] (please enter 7 digits)

## 2019-12-13 NOTE — Progress Notes (Signed)
Pt resting in bed, sedated on vent. No s/s of distress noted. Pt on fentanyl and propofol gtt for sedation. Pt responds to pain but does not open eyes or follow commands. Tube feeds infusing at goal rate. Urine output WNL throughout shift. All VSS on 60% FIOR. Wife and son visited pt today.

## 2019-12-14 LAB — CBC WITH DIFFERENTIAL/PLATELET
Abs Immature Granulocytes: 0.19 10*3/uL — ABNORMAL HIGH (ref 0.00–0.07)
Basophils Absolute: 0 10*3/uL (ref 0.0–0.1)
Basophils Relative: 0 %
Eosinophils Absolute: 0.1 10*3/uL (ref 0.0–0.5)
Eosinophils Relative: 1 %
HCT: 31.7 % — ABNORMAL LOW (ref 39.0–52.0)
Hemoglobin: 9.8 g/dL — ABNORMAL LOW (ref 13.0–17.0)
Immature Granulocytes: 3 %
Lymphocytes Relative: 5 %
Lymphs Abs: 0.4 10*3/uL — ABNORMAL LOW (ref 0.7–4.0)
MCH: 29.9 pg (ref 26.0–34.0)
MCHC: 30.9 g/dL (ref 30.0–36.0)
MCV: 96.6 fL (ref 80.0–100.0)
Monocytes Absolute: 0.5 10*3/uL (ref 0.1–1.0)
Monocytes Relative: 6 %
Neutro Abs: 6.5 10*3/uL (ref 1.7–7.7)
Neutrophils Relative %: 85 %
Platelets: 138 10*3/uL — ABNORMAL LOW (ref 150–400)
RBC: 3.28 MIL/uL — ABNORMAL LOW (ref 4.22–5.81)
RDW: 17.9 % — ABNORMAL HIGH (ref 11.5–15.5)
WBC: 7.7 10*3/uL (ref 4.0–10.5)
nRBC: 0.5 % — ABNORMAL HIGH (ref 0.0–0.2)

## 2019-12-14 LAB — GLUCOSE, CAPILLARY
Glucose-Capillary: 112 mg/dL — ABNORMAL HIGH (ref 70–99)
Glucose-Capillary: 113 mg/dL — ABNORMAL HIGH (ref 70–99)
Glucose-Capillary: 120 mg/dL — ABNORMAL HIGH (ref 70–99)
Glucose-Capillary: 130 mg/dL — ABNORMAL HIGH (ref 70–99)
Glucose-Capillary: 134 mg/dL — ABNORMAL HIGH (ref 70–99)
Glucose-Capillary: 141 mg/dL — ABNORMAL HIGH (ref 70–99)
Glucose-Capillary: 150 mg/dL — ABNORMAL HIGH (ref 70–99)

## 2019-12-14 LAB — MAGNESIUM: Magnesium: 2.5 mg/dL — ABNORMAL HIGH (ref 1.7–2.4)

## 2019-12-14 LAB — RENAL FUNCTION PANEL
Albumin: 2.9 g/dL — ABNORMAL LOW (ref 3.5–5.0)
Anion gap: 6 (ref 5–15)
BUN: 80 mg/dL — ABNORMAL HIGH (ref 8–23)
CO2: 38 mmol/L — ABNORMAL HIGH (ref 22–32)
Calcium: 9.5 mg/dL (ref 8.9–10.3)
Chloride: 105 mmol/L (ref 98–111)
Creatinine, Ser: 0.85 mg/dL (ref 0.61–1.24)
GFR calc Af Amer: >60 mL/min (ref >60)
GFR calc non Af Amer: >60 mL/min (ref >60)
Glucose, Bld: 149 mg/dL — ABNORMAL HIGH (ref 70–99)
Phosphorus: 3.1 mg/dL (ref 2.5–4.6)
Potassium: 4.9 mmol/L (ref 3.5–5.1)
Sodium: 149 mmol/L — ABNORMAL HIGH (ref 135–145)

## 2019-12-14 LAB — TRIGLYCERIDES: Triglycerides: 175 mg/dL — ABNORMAL HIGH (ref <150)

## 2019-12-14 MED ORDER — ENOXAPARIN SODIUM 40 MG/0.4ML ~~LOC~~ SOLN
40.0000 mg | SUBCUTANEOUS | Status: DC
  Administered 2019-12-14 – 2019-12-20 (×7): 40 mg via SUBCUTANEOUS
  Filled 2019-12-14 (×7): qty 0.4

## 2019-12-14 NOTE — Progress Notes (Signed)
[name] Kitchen CRITICAL CARE NOTE 59 y.o.malew/chronic neck and back pain whopresented to [facility] 9/2/2021for issues with an incarcerated ventral hernia. In the process of being evaluated the patient was noted to be hypoxic and tested postive for COVID-19.Patient was admitted for management of acute respiratory failure with hypoxia due to COVID-19 pneumonia. Patient transferred to the ICU/stepdown on 27 November 2019 due to increasing FiO2 requirements.  SIGNIFICANT EVENTS/TEST 9/14 progressive resp failure, patient decided to be placed on VENT 9/15 patient emergently intubated, RT CVL placed, LT FEM ART PLACED 9/16severe hypoxia, proned position, PATIENT IS DNR 9/17 severe hypoxia, plan for proning today Wife [name] has been updated daily 9/18 severe ARDS hypoxia, proned positioned, wife updated daily 9/19 remains proned, severe ARDS 9/20 ARDS, resp failure, severe ARDS, PRONED, wife updated daily 9/21 severe ARDS, resp failure, severe hypoxia 9/22 plan for proning today, severe ARDS, severe Hypoxia 9/23 pt unproned tolerating well with O2 sats _0 % 9/24 continue cycling proning/supine position 9/27- FiO2 60 peep 15 patient is DNR wife asking about tracheostomy.  I spoke to wife [name] today to give update, she understands that patient is doing poorly and is at day 12 of mechanical ventilation she wishes to speak to ENT regarding trache.    REVIEW OF SYSTEMS Unable to assess pt intubated mechanically ventilated  SUBJECTIVE: Unable to assess pt intubated mechanically ventilated  Vent Mode: PRVC FiO2 (%):  [60 %] 60 % Set Rate:  [35 bmp] 35 bmp Vt Set:  [450 mL] 450 mL PEEP:  [15 cmH20] 15 cmH20 Plateau Pressure:  [26 cmH20-36 cmH20] 26 cmH20   BP 111/62   Pulse (!) 104   Temp 98.6 F (37 C) (Axillary)   Resp (!) 36   Ht 5' 10.98" (1.803 m)   Wt 98.6 kg   SpO2 (!) 89%   BMI 30.33 kg/m    I/O last 3 completed shifts: In: 2071.9 [I.V.:1478.4; NG/GT:463.5; IV Piggyback:130] Out: 2520  [Urine:2520] Total I/O In: 94 [I.V.:94] Out: 225 [Urine:225]  SpO2: (!) 89 % O2 Flow Rate (L/min): 55 L/min FiO2 (%): 60 %  Estimated body mass index is 30.33 kg/m as calculated from the following:   Height as of this encounter: 5' 10.98" (1.803 m).   Weight as of this encounter: 98.6 kg.  Pressure Injury 12/05/19 Jaw Right Stage 2 -  Partial thickness loss of dermis presenting as a shallow open injury with a red, pink wound bed without slough. (Active)  12/05/19 0500  Location: Jaw  Location Orientation: Right  Staging: Stage 2 -  Partial thickness loss of dermis presenting as a shallow open injury with a red, pink wound bed without slough.  Wound Description (Comments):   Present on Admission: No     Pressure Injury 12/06/19 Face Right Stage 2 -  Partial thickness loss of dermis presenting as a shallow open injury with a red, pink wound bed without slough. (Active)  12/06/19 0730  Location: Face  Location Orientation: Right  Staging: Stage 2 -  Partial thickness loss of dermis presenting as a shallow open injury with a red, pink wound bed without slough.  Wound Description (Comments):   Present on Admission: No     Pressure Injury 12/06/19 Penis Right;Distal Deep Tissue Pressure Injury - Purple or maroon localized area of discolored intact skin or blood-filled blister due to damage of underlying soft tissue from pressure and/or shear. (Active)  12/06/19 1030  Location: Penis  Location Orientation: Right;Distal  Staging: Deep Tissue Pressure Injury - Purple or maroon  localized area of discolored intact skin or blood-filled blister due to damage of underlying soft tissue from pressure and/or shear.  Wound Description (Comments):   Present on Admission:      Pressure Injury 12/06/19 Ear Right Stage 2 -  Partial thickness loss of dermis presenting as a shallow open injury with a red, pink wound bed without slough. (Active)  12/06/19 0800  Location: Ear  Location Orientation:  Right  Staging: Stage 2 -  Partial thickness loss of dermis presenting as a shallow open injury with a red, pink wound bed without slough.  Wound Description (Comments):   Present on Admission: No     Pressure Injury 12/09/19 Face Left Stage 2 -  Partial thickness loss of dermis presenting as a shallow open injury with a red, pink wound bed without slough. skin tear under ETT holder adhesive device (Active)  12/09/19 1200  Location: Face  Location Orientation: Left  Staging: Stage 2 -  Partial thickness loss of dermis presenting as a shallow open injury with a red, pink wound bed without slough.  Wound Description (Comments): skin tear under ETT holder adhesive device  Present on Admission: No   PHYSICAL EXAMINATION: Physical Exam physical examination is limited due to need for PPE/CAPR GENERAL: Acutely ill appearing male, synchronous, mechanically ventilated HEAD: Normocephalic, atraumatic.  EYES: Pupils equal, round, sluggish to light.  No scleral icterus.  MOUTH: Orotracheally intubated, OG in place. NECK: Supple. Trachea midline. No JVD. PULMONARY: diminished breath sounds throughout, even, non labored, subcutaneous emphysema present bilateral upper chest  CARDIOVASCULAR:Regular rate and rhythm noted on monitor.  ABDOMEN: Soft, nondistended, hypoactive bowel sounds, rectal tube in place with watery brown stools  MUSCULOSKELETAL: 2+ generalized edema   NEUROLOGIC: sedated, not following commands, PERRL SKIN: facial pressure injuries, subcu emphysema, not increasing.   Scheduled Meds: . vitamin C  1,000 mg Per Tube Daily  . chlorhexidine gluconate (MEDLINE KIT)  15 mL Mouth Rinse BID  . Chlorhexidine Gluconate Cloth  6 each Topical Daily  . dexamethasone (DECADRON) injection  6 mg Intravenous Q24H  . docusate  100 mg Per Tube BID  . famotidine  20 mg Per Tube BID  . feeding supplement (PROSource TF)  90 mL Per Tube BID  . free water  200 mL Per Tube Q4H  . insulin aspart  0-15  Units Subcutaneous Q4H  . mouth rinse  15 mL Mouth Rinse 10 times per day  . polyethylene glycol  17 g Per Tube Daily  . sodium chloride flush  10-40 mL Intracatheter Q12H  . traZODone  100 mg Per Tube QHS  . zinc sulfate  220 mg Per Tube Daily   Continuous Infusions: . feeding supplement (VITAL AF 1.2 CAL) 1,000 mL (12/14/19 0755)  . fentaNYL infusion INTRAVENOUS 200 mcg/hr (12/14/19 0811)  . propofol (DIPRIVAN) infusion 50 mcg/kg/min (12/14/19 0936)   PRN Meds:.acetaminophen, fentaNYL, ondansetron **OR** ondansetron (ZOFRAN) IV, sodium chloride flush, vecuronium      Indwelling Urinary Catheter continued, requirement due to   Reason to continue Indwelling Urinary Catheter strict Intake/Output monitoring for hemodynamic instability   Central Line/ DC central line, PICC line 9/24  Reason to continue [employer] continued, requirement due to severe respiratory failure   Ventilator Sedation RASS -2 to -3      ASSESSMENT AND PLAN SYNOPSIS  Acute hypoxemic respiratory failure due to COVID-19 pneumonia / ARDS Mechanical ventilation via ARDS protocol, target PRVC 6 cc/kg Wean PEEP and FiO2 as able Goal plateau  pressure less than 30, driving pressure less than 15 Paralytics if necessary for vent synchrony, gas exchange Cycle prone positioning if necessary for oxygenation PAD protocol, goal RASS -2 to -3, currently requiring fentanyl and propofol gtts  Diuresis as blood pressure and renal function can tolerate, received VAP prevention order set IV STEROIDS, continue Vitamin C, zinc   Acute toxic metabolic encephalopathy Ventilator discomfort sedation Goal RASS -2 to -3 Fentanyl/propofol  SHOCK Shock physiology has corrected  Thrombocytopenia  Platelets holding at 99K Due to severe critical illness Bone marrow suppression from viral illness Currently on SCDs for DVT prophylaxis  ID Monitor fever/WBCs Trend procalcitonin as  indicated  Prophylaxis DVT: SCDs GI: Famotidine   Severe protein calorie malnutrition Hypoalbuminemia Supplemental albumin Continue tube feeds Trend prealbumin  ENDO ICU hyperglycemia protocol   Multidisciplinary rounds were performed with the ICU team.    Critical care provider statement:    Critical care time (minutes):  33   Critical care time was exclusive of:  Separately billable procedures and  treating other patients   Critical care was necessary to treat or prevent imminent or  life-threatening deterioration of the following conditions:  acute hypoxemic respiratory failure.    Critical care was time spent personally by me on the following  activities:  Development of treatment plan with patient or surrogate,  discussions with consultants, evaluation of patient's response to  treatment, examination of patient, obtaining history from patient or  surrogate, ordering and performing treatments and interventions, ordering  and review of laboratory studies and re-evaluation of patient's condition   I assumed direction of critical care for this patient from another  provider in my specialty: no     Ottie Glazier, M.D.  [facility] [place]

## 2019-12-15 ENCOUNTER — Inpatient Hospital Stay: Payer: HRSA Program

## 2019-12-15 LAB — CBC WITH DIFFERENTIAL/PLATELET
Abs Immature Granulocytes: 0.26 10*3/uL — ABNORMAL HIGH (ref 0.00–0.07)
Basophils Absolute: 0 10*3/uL (ref 0.0–0.1)
Basophils Relative: 0 %
Eosinophils Absolute: 0.1 10*3/uL (ref 0.0–0.5)
Eosinophils Relative: 2 %
HCT: 30.1 % — ABNORMAL LOW (ref 39.0–52.0)
Hemoglobin: 8.9 g/dL — ABNORMAL LOW (ref 13.0–17.0)
Immature Granulocytes: 4 %
Lymphocytes Relative: 6 %
Lymphs Abs: 0.4 10*3/uL — ABNORMAL LOW (ref 0.7–4.0)
MCH: 29.2 pg (ref 26.0–34.0)
MCHC: 29.6 g/dL — ABNORMAL LOW (ref 30.0–36.0)
MCV: 98.7 fL (ref 80.0–100.0)
Monocytes Absolute: 0.5 10*3/uL (ref 0.1–1.0)
Monocytes Relative: 7 %
Neutro Abs: 6 10*3/uL (ref 1.7–7.7)
Neutrophils Relative %: 81 %
Platelets: 164 10*3/uL (ref 150–400)
RBC: 3.05 MIL/uL — ABNORMAL LOW (ref 4.22–5.81)
RDW: 17.8 % — ABNORMAL HIGH (ref 11.5–15.5)
WBC: 7.3 10*3/uL (ref 4.0–10.5)
nRBC: 0.3 % — ABNORMAL HIGH (ref 0.0–0.2)

## 2019-12-15 LAB — GLUCOSE, CAPILLARY
Glucose-Capillary: 112 mg/dL — ABNORMAL HIGH (ref 70–99)
Glucose-Capillary: 131 mg/dL — ABNORMAL HIGH (ref 70–99)
Glucose-Capillary: 127 mg/dL — ABNORMAL HIGH (ref 70–99)
Glucose-Capillary: 124 mg/dL — ABNORMAL HIGH (ref 70–99)
Glucose-Capillary: 146 mg/dL — ABNORMAL HIGH (ref 70–99)
Glucose-Capillary: 152 mg/dL — ABNORMAL HIGH (ref 70–99)

## 2019-12-15 LAB — RENAL FUNCTION PANEL
Albumin: 2.8 g/dL — ABNORMAL LOW (ref 3.5–5.0)
Anion gap: 8 (ref 5–15)
BUN: 73 mg/dL — ABNORMAL HIGH (ref 8–23)
CO2: 36 mmol/L — ABNORMAL HIGH (ref 22–32)
Calcium: 9.2 mg/dL (ref 8.9–10.3)
Chloride: 103 mmol/L (ref 98–111)
Creatinine, Ser: 0.81 mg/dL (ref 0.61–1.24)
GFR calc Af Amer: >60 mL/min (ref >60)
GFR calc non Af Amer: >60 mL/min (ref >60)
Glucose, Bld: 125 mg/dL — ABNORMAL HIGH (ref 70–99)
Phosphorus: 2.8 mg/dL (ref 2.5–4.6)
Potassium: 4.9 mmol/L (ref 3.5–5.1)
Sodium: 147 mmol/L — ABNORMAL HIGH (ref 135–145)

## 2019-12-15 LAB — MAGNESIUM: Magnesium: 2.4 mg/dL (ref 1.7–2.4)

## 2019-12-15 MED ORDER — FUROSEMIDE 10 MG/ML IJ SOLN
20.0000 mg | Freq: Two times a day (BID) | INTRAMUSCULAR | Status: DC
  Administered 2019-12-15 – 2019-12-16 (×2): 20 mg via INTRAVENOUS
  Filled 2019-12-15 (×2): qty 2

## 2019-12-15 NOTE — Consult Note (Signed)
[place], [place] [id number] [birth year] Vida Rigger, MD  Reason for Consult: Failure to extubate  HPI: Patient admitted with abdominal hernia subsequently developed severe Covid infection subsequently has had severe pulmonary disease requiring intubation.  Attempts at extubation have failed we have been consulted for possible tracheostomy tube placement.  There are 2 daughters at bedside with whom I spoke.  Allergies: No Known Allergies  ROS: Review of systems normal other than 12 systems except per HPI.  PMH:  Past Medical History:  Diagnosis Date  . Chronic neck and back pain     FH:  Family History  Problem Relation Age of Onset  . Stroke Father   . Diabetes Mother   . Heart disease Mother     SH:  Social History   Socioeconomic History  . Marital status: Married    Spouse name: Not on file  . Number of children: Not on file  . Years of education: Not on file  . Highest education level: Not on file  Occupational History  . Not on file  Tobacco Use  . Smoking status: Never Smoker  . Smokeless tobacco: Never Used  Substance and Sexual Activity  . Alcohol use: No  . Drug use: No  . Sexual activity: Not on file  Other Topics Concern  . Not on file  Social History Narrative  . Not on file   Social Determinants of Health   Financial Resource Strain:   . Difficulty of Paying Living Expenses: Not on file  Food Insecurity:   . Worried About Programme researcher, broadcasting/film/video in the Last Year: Not on file  . Ran Out of Food in the Last Year: Not on file  Transportation Needs:   . Lack of Transportation (Medical): Not on file  . Lack of Transportation (Non-Medical): Not on file  Physical Activity:   . Days of Exercise per Week: Not on file  . Minutes of Exercise per Session: Not on file  Stress:   . Feeling of Stress : Not on file  Social Connections:   . Frequency of Communication with Friends and Family: Not on file  . Frequency of Social Gatherings with Friends and  Family: Not on file  . Attends Religious Services: Not on file  . Active Member of Clubs or Organizations: Not on file  . Attends Banker Meetings: Not on file  . Marital Status: Not on file  Intimate Partner Violence:   . Fear of Current or Ex-Partner: Not on file  . Emotionally Abused: Not on file  . Physically Abused: Not on file  . Sexually Abused: Not on file    PSH:  Past Surgical History:  Procedure Laterality Date  . CHOLECYSTECTOMY      Physical  Exam: Patient is intubated and sedated in the ICU.  Extremities.  Normal the anterior nose benign.  Examination of the neck showed no previous scar from tracheostomy tube placement in the past, he is not obese. A/P: Failure to extubate-agree with plan for tracheostomy.  Discussed with his daughters answered questions discussed risk and benefits clean bleeding infection, pneumonia and death.  They will discuss with his wife.  I will have our scheduling our office to look at a date for possible tracheostomy tube placement in the near future and let ICU team know when the date has been chosen.  If there is a question from family or wife feel free to have them call me in the office for further discussion.  ICU time  approximately 45 minutes   Davina Poke 12/15/2019 6:31 PM

## 2019-12-15 NOTE — Progress Notes (Signed)
[name] Kitchen CRITICAL CARE NOTE 59 y.o.malew/chronic neck and back pain whopresented to [facility] 9/2/2021for issues with an incarcerated ventral hernia. In the process of being evaluated the patient was noted to be hypoxic and tested postive for COVID-19.Patient was admitted for management of acute respiratory failure with hypoxia due to COVID-19 pneumonia. Patient transferred to the ICU/stepdown on 27 November 2019 due to increasing FiO2 requirements.  SIGNIFICANT EVENTS/TEST 9/14 progressive resp failure, patient decided to be placed on VENT 9/15 patient emergently intubated, RT CVL placed, LT FEM ART PLACED 9/16severe hypoxia, proned position, PATIENT IS DNR 9/17 severe hypoxia, plan for proning today Wife [name] has been updated daily 9/18 severe ARDS hypoxia, proned positioned, wife updated daily 9/19 remains proned, severe ARDS 9/20 ARDS, resp failure, severe ARDS, PRONED, wife updated daily 9/21 severe ARDS, resp failure, severe hypoxia 9/22 plan for proning today, severe ARDS, severe Hypoxia 9/23 pt unproned tolerating well with O2 sats _0 % 9/24 continue cycling proning/supine position 9/27- FiO2 60 peep 15 patient is DNR wife asking about tracheostomy.  I spoke to wife [name] today to give update, she understands that patient is doing poorly and is at day 12 of mechanical ventilation she wishes to speak to ENT regarding trache.   12/15/19- met with family at bedside, reviewed care plan. Sent consult request for ENT today. Patient with peripheral and scrotal edema during physical examination, will plan to gently diurese today. FiO2 up to 70% today, likely atelectatic changes vs fluid shift related due to several gtt.   REVIEW OF SYSTEMS Unable to assess pt intubated mechanically ventilated  SUBJECTIVE: Unable to assess pt intubated mechanically ventilated  Vent Mode: PRVC FiO2 (%):  [60 %-70 %] 70 % Set Rate:  [35 bmp] 35 bmp Vt Set:  [450 mL] 450 mL PEEP:  [15 cmH20] 15 cmH20 Plateau  Pressure:  [26 cmH20-31 cmH20] 31 cmH20   BP (!) 105/58   Pulse 99   Temp (!) 97.1 F (36.2 C) (Axillary)   Resp (!) 38   Ht 5' 10.98" (1.803 m)   Wt 105.5 kg   SpO2 91%   BMI 32.45 kg/m    I/O last 3 completed shifts: In: 2431 [I.V.:1711; NG/GT:720] Out: 2995 [Urine:2895; Stool:100] No intake/output data recorded.  SpO2: 91 % O2 Flow Rate (L/min): 55 L/min FiO2 (%): 70 %  Estimated body mass index is 32.45 kg/m as calculated from the following:   Height as of this encounter: 5' 10.98" (1.803 m).   Weight as of this encounter: 105.5 kg.  Pressure Injury 12/05/19 Jaw Right Stage 2 -  Partial thickness loss of dermis presenting as a shallow open injury with a red, pink wound bed without slough. (Active)  12/05/19 0500  Location: Jaw  Location Orientation: Right  Staging: Stage 2 -  Partial thickness loss of dermis presenting as a shallow open injury with a red, pink wound bed without slough.  Wound Description (Comments):   Present on Admission: No     Pressure Injury 12/06/19 Face Right Stage 2 -  Partial thickness loss of dermis presenting as a shallow open injury with a red, pink wound bed without slough. (Active)  12/06/19 0730  Location: Face  Location Orientation: Right  Staging: Stage 2 -  Partial thickness loss of dermis presenting as a shallow open injury with a red, pink wound bed without slough.  Wound Description (Comments):   Present on Admission: No     Pressure Injury 12/06/19 Penis Right;Distal Deep Tissue Pressure Injury - Purple or  maroon localized area of discolored intact skin or blood-filled blister due to damage of underlying soft tissue from pressure and/or shear. (Active)  12/06/19 1030  Location: Penis  Location Orientation: Right;Distal  Staging: Deep Tissue Pressure Injury - Purple or maroon localized area of discolored intact skin or blood-filled blister due to damage of underlying soft tissue from pressure and/or shear.  Wound Description  (Comments):   Present on Admission:      Pressure Injury 12/06/19 Ear Right Stage 2 -  Partial thickness loss of dermis presenting as a shallow open injury with a red, pink wound bed without slough. (Active)  12/06/19 0800  Location: Ear  Location Orientation: Right  Staging: Stage 2 -  Partial thickness loss of dermis presenting as a shallow open injury with a red, pink wound bed without slough.  Wound Description (Comments):   Present on Admission: No     Pressure Injury 12/09/19 Face Left Stage 2 -  Partial thickness loss of dermis presenting as a shallow open injury with a red, pink wound bed without slough. skin tear under ETT holder adhesive device (Active)  12/09/19 1200  Location: Face  Location Orientation: Left  Staging: Stage 2 -  Partial thickness loss of dermis presenting as a shallow open injury with a red, pink wound bed without slough.  Wound Description (Comments): skin tear under ETT holder adhesive device  Present on Admission: No   PHYSICAL EXAMINATION:  GENERAL: Acutely ill appearing male, synchronous, mechanically ventilated HEAD: Normocephalic, atraumatic.  EYES: Pupils equal, round, sluggish to light.  No scleral icterus.  MOUTH: Orotracheally intubated, OG in place. NECK: Supple. Trachea midline. No JVD. Numerous areas of skin breakdown around neck and face from proning protocol PULMONARY: diminished breath sounds throughout, even, non labored, subcutaneous emphysema present bilateral upper chest  CARDIOVASCULAR:Regular rate and rhythm noted on monitor.  ABDOMEN: Soft, nondistended, hypoactive bowel sounds, rectal tube in place with watery brown stools, +scrotal edema MUSCULOSKELETAL: 2+ generalized edema   NEUROLOGIC: sedated, not following commands, PERRL GCS4T SKIN: facial pressure injuries, subcu emphysema, not increasing.   Scheduled Meds: . vitamin C  1,000 mg Per Tube Daily  . chlorhexidine gluconate (MEDLINE KIT)  15 mL Mouth Rinse BID  .  Chlorhexidine Gluconate Cloth  6 each Topical Daily  . dexamethasone (DECADRON) injection  6 mg Intravenous Q24H  . docusate  100 mg Per Tube BID  . enoxaparin (LOVENOX) injection  40 mg Subcutaneous Q24H  . famotidine  20 mg Per Tube BID  . feeding supplement (PROSource TF)  90 mL Per Tube BID  . free water  200 mL Per Tube Q4H  . insulin aspart  0-15 Units Subcutaneous Q4H  . mouth rinse  15 mL Mouth Rinse 10 times per day  . polyethylene glycol  17 g Per Tube Daily  . sodium chloride flush  10-40 mL Intracatheter Q12H  . traZODone  100 mg Per Tube QHS  . zinc sulfate  220 mg Per Tube Daily   Continuous Infusions: . feeding supplement (VITAL AF 1.2 CAL) 60 mL/hr at 12/15/19 0600  . fentaNYL infusion INTRAVENOUS 100 mcg/hr (12/15/19 0950)  . propofol (DIPRIVAN) infusion 20 mcg/kg/min (12/15/19 0950)   PRN Meds:.acetaminophen, fentaNYL, ondansetron **OR** ondansetron (ZOFRAN) IV, sodium chloride flush, vecuronium      Indwelling Urinary Catheter continued, requirement due to   Reason to continue Indwelling Urinary Catheter strict Intake/Output monitoring for hemodynamic instability   Central Line/ DC central line, PICC line 9/24  Reason to continue  Central Line   Ventilator continued, requirement due to severe respiratory failure   Ventilator Sedation RASS -2 to -3      ASSESSMENT AND PLAN SYNOPSIS  Acute hypoxemic respiratory failure due to COVID-19 pneumonia / ARDS Mechanical ventilation via ARDS protocol, target PRVC 6 cc/kg Wean PEEP and FiO2 as able Goal plateau pressure less than 30, driving pressure less than 15 Paralytics if necessary for vent synchrony, gas exchange Cycle prone positioning if necessary for oxygenation PAD protocol, goal RASS -2 to -3, currently requiring fentanyl and propofol gtts  Diuresis as blood pressure and renal function can tolerate, received VAP prevention order set IV STEROIDS, continue Vitamin C, zinc   Acute toxic metabolic  encephalopathy Ventilator discomfort sedation Goal RASS -2 to -3 Fentanyl/propofol  SHOCK Shock physiology has corrected  Thrombocytopenia  Platelets holding at 99K Due to severe critical illness Bone marrow suppression from viral illness Currently on SCDs for DVT prophylaxis  ID Monitor fever/WBCs Trend procalcitonin as indicated  Prophylaxis DVT: SCDs GI: Famotidine   Severe protein calorie malnutrition Hypoalbuminemia Supplemental albumin Continue tube feeds Trend prealbumin  ENDO ICU hyperglycemia protocol   Multidisciplinary rounds were performed with the ICU team.    Critical care provider statement:    Critical care time (minutes):  33   Critical care time was exclusive of:  Separately billable procedures and  treating other patients   Critical care was necessary to treat or prevent imminent or  life-threatening deterioration of the following conditions:  acute hypoxemic respiratory failure.    Critical care was time spent personally by me on the following  activities:  Development of treatment plan with patient or surrogate,  discussions with consultants, evaluation of patient's response to  treatment, examination of patient, obtaining history from patient or  surrogate, ordering and performing treatments and interventions, ordering  and review of laboratory studies and re-evaluation of patient's condition   I assumed direction of critical care for this patient from another  provider in my specialty: no     Ottie Glazier, M.D.  Pulmonary & [place]

## 2019-12-15 NOTE — Consult Note (Signed)
WOC Nurse wound follow up Wound type: Unstageable pressure injuries to right ear, left cheek, right penis.   Measurement:Left cheek, beneath ET tubing securement:  0.5 cm x 0.4 cm x 0.2 cm peeling epithelium.   Right ear:  2 cm x 0.3 cm scabbed abrasion, ET tube strapping is secured over this.  I have asked that this be moved.  Right penis:  3 cm x 1.5 cm scabbed lesion.  Foley in place.  Wound bed: scabbed Drainage (amount, consistency, odor) none noted.  Periwound:Dry skin Dressing procedure/placement/frequency: Continue silicone foam to left cheek and right ear.  Vaseline gauze to penis, secured with kerlix.  Will assess weekly.   Maple Hudson MSN, RN, FNP-BC CWON Wound, Ostomy, Continence Nurse Pager [phone]

## 2019-12-16 ENCOUNTER — Inpatient Hospital Stay: Payer: HRSA Program

## 2019-12-16 LAB — BLOOD GAS, ARTERIAL
Acid-Base Excess: 24.4 mmol/L — ABNORMAL HIGH (ref 0.0–2.0)
Acid-Base Excess: 23.8 mmol/L — ABNORMAL HIGH (ref 0.0–2.0)
Bicarbonate: 51.8 mmol/L — ABNORMAL HIGH (ref 20.0–28.0)
Bicarbonate: 52.7 mmol/L — ABNORMAL HIGH (ref 20.0–28.0)
FIO2: 0.65
FIO2: 0.7
MECHVT: 450 mL
O2 Saturation: 91 %
O2 Saturation: 90.7 %
PEEP: 5 cmH2O
PEEP: 15 cmH2O
Patient temperature: 37
Patient temperature: 37
RATE: 16 resp/min
RATE: 35 resp/min
pCO2 arterial: 68 mmHg (ref 32.0–48.0)
pCO2 arterial: 87 mmHg (ref 32.0–48.0)
pH, Arterial: 7.49 — ABNORMAL HIGH (ref 7.350–7.450)
pH, Arterial: 7.39 (ref 7.350–7.450)
pO2, Arterial: 56 mmHg — ABNORMAL LOW (ref 83.0–108.0)
pO2, Arterial: 61 mmHg — ABNORMAL LOW (ref 83.0–108.0)

## 2019-12-16 LAB — CBC WITH DIFFERENTIAL/PLATELET
Abs Immature Granulocytes: 0.27 10*3/uL — ABNORMAL HIGH (ref 0.00–0.07)
Basophils Absolute: 0 10*3/uL (ref 0.0–0.1)
Basophils Relative: 0 %
Eosinophils Absolute: 0 10*3/uL (ref 0.0–0.5)
Eosinophils Relative: 0 %
HCT: 31.7 % — ABNORMAL LOW (ref 39.0–52.0)
Hemoglobin: 9.7 g/dL — ABNORMAL LOW (ref 13.0–17.0)
Immature Granulocytes: 3 %
Lymphocytes Relative: 4 %
Lymphs Abs: 0.3 10*3/uL — ABNORMAL LOW (ref 0.7–4.0)
MCH: 29.4 pg (ref 26.0–34.0)
MCHC: 30.6 g/dL (ref 30.0–36.0)
MCV: 96.1 fL (ref 80.0–100.0)
Monocytes Absolute: 0.4 10*3/uL (ref 0.1–1.0)
Monocytes Relative: 5 %
Neutro Abs: 7.2 10*3/uL (ref 1.7–7.7)
Neutrophils Relative %: 88 %
Platelets: 220 10*3/uL (ref 150–400)
RBC: 3.3 MIL/uL — ABNORMAL LOW (ref 4.22–5.81)
RDW: 17.7 % — ABNORMAL HIGH (ref 11.5–15.5)
WBC: 8.2 10*3/uL (ref 4.0–10.5)
nRBC: 0.4 % — ABNORMAL HIGH (ref 0.0–0.2)

## 2019-12-16 LAB — HEPATIC FUNCTION PANEL
ALT: 97 U/L — ABNORMAL HIGH (ref 0–44)
AST: 55 U/L — ABNORMAL HIGH (ref 15–41)
Albumin: 2.6 g/dL — ABNORMAL LOW (ref 3.5–5.0)
Alkaline Phosphatase: 133 U/L — ABNORMAL HIGH (ref 38–126)
Bilirubin, Direct: 0.2 mg/dL (ref 0.0–0.2)
Indirect Bilirubin: 0.6 mg/dL (ref 0.3–0.9)
Total Bilirubin: 0.8 mg/dL (ref 0.3–1.2)
Total Protein: 5.2 g/dL — ABNORMAL LOW (ref 6.5–8.1)

## 2019-12-16 LAB — RENAL FUNCTION PANEL
Albumin: 2.6 g/dL — ABNORMAL LOW (ref 3.5–5.0)
Anion gap: 4 — ABNORMAL LOW (ref 5–15)
BUN: 66 mg/dL — ABNORMAL HIGH (ref 8–23)
CO2: 41 mmol/L — ABNORMAL HIGH (ref 22–32)
Calcium: 9 mg/dL (ref 8.9–10.3)
Chloride: 103 mmol/L (ref 98–111)
Creatinine, Ser: 0.72 mg/dL (ref 0.61–1.24)
GFR calc Af Amer: >60 mL/min (ref >60)
GFR calc non Af Amer: >60 mL/min (ref >60)
Glucose, Bld: 114 mg/dL — ABNORMAL HIGH (ref 70–99)
Phosphorus: 2.8 mg/dL (ref 2.5–4.6)
Potassium: 4.6 mmol/L (ref 3.5–5.1)
Sodium: 148 mmol/L — ABNORMAL HIGH (ref 135–145)

## 2019-12-16 LAB — GLUCOSE, CAPILLARY
Glucose-Capillary: 105 mg/dL — ABNORMAL HIGH (ref 70–99)
Glucose-Capillary: 114 mg/dL — ABNORMAL HIGH (ref 70–99)
Glucose-Capillary: 122 mg/dL — ABNORMAL HIGH (ref 70–99)
Glucose-Capillary: 123 mg/dL — ABNORMAL HIGH (ref 70–99)
Glucose-Capillary: 125 mg/dL — ABNORMAL HIGH (ref 70–99)
Glucose-Capillary: 143 mg/dL — ABNORMAL HIGH (ref 70–99)
Glucose-Capillary: 97 mg/dL (ref 70–99)

## 2019-12-16 LAB — MAGNESIUM: Magnesium: 2.3 mg/dL (ref 1.7–2.4)

## 2019-12-16 MED ORDER — FUROSEMIDE 10 MG/ML IJ SOLN
40.0000 mg | Freq: Every day | INTRAMUSCULAR | Status: DC
  Administered 2019-12-16 – 2019-12-17 (×2): 40 mg via INTRAVENOUS
  Filled 2019-12-16 (×2): qty 4

## 2019-12-16 NOTE — Progress Notes (Signed)
Patients oxygen saturation ranging 83-85% on PC setting w/PEEP 5 and 70%FIO2. Last ABG obtained at 1100 today and as follows ABG: 7.49/68/56/51. Prior CO2 84 and PaO2 60.   Discussed w/RT and placed patient back on prior vent settings as follows: PRVC RR35, TV 450, PEEP 15 and FIO2 70%. Oxygen saturation improved to 92%.   --Repeat ABG pending --CXR ordered for AM    Amanda Cockayne ACNP-BC

## 2019-12-16 NOTE — Progress Notes (Signed)
Rick Lowery CRITICAL CARE NOTE 59 y.o.malew/chronic neck and back pain whopresented to [facility] 9/2/2021for issues with an incarcerated ventral hernia. In the process of being evaluated the patient was noted to be hypoxic and tested postive for COVID-19.Patient was admitted for management of acute respiratory failure with hypoxia due to COVID-19 pneumonia. Patient transferred to the ICU/stepdown on 27 November 2019 due to increasing FiO2 requirements.  SIGNIFICANT EVENTS/TEST 9/14 progressive resp failure, patient decided to be placed on VENT 9/15 patient emergently intubated, RT CVL placed, LT FEM ART PLACED 9/16severe hypoxia, proned position, PATIENT IS DNR 9/17 severe hypoxia, plan for proning today Wife [name] has been updated daily 9/18 severe ARDS hypoxia, proned positioned, wife updated daily 9/19 remains proned, severe ARDS 9/20 ARDS, resp failure, severe ARDS, PRONED, wife updated daily 9/21 severe ARDS, resp failure, severe hypoxia 9/22 plan for proning today, severe ARDS, severe Hypoxia 9/23 pt unproned tolerating well with O2 sats _0 % 9/24 continue cycling proning/supine position 9/27- FiO2 60 peep 15 patient is DNR wife asking about tracheostomy.  I spoke to wife [name] today to give update, she understands that patient is doing poorly and is at day 12 of mechanical ventilation she wishes to speak to ENT regarding trache.   12/15/19- met with family at bedside, reviewed care plan. Sent consult request for ENT today. Patient with peripheral and scrotal edema during physical examination, will plan to gently diurese today. FiO2 up to 70% today, likely atelectatic changes vs fluid shift related due to several gtt.  9/29- patient on 70% fiO2, adequate urine output overnight. Spoke to wife at bedside, reviewed short term plan and ENT plan for poss trache.   REVIEW OF SYSTEMS Unable to assess pt intubated mechanically ventilated  SUBJECTIVE: Unable to assess pt intubated mechanically  ventilated  Vent Mode: PCV FiO2 (%):  [65 %-70 %] 70 % Set Rate:  [16 bmp-35 bmp] 16 bmp Vt Set:  [450 mL] 450 mL PEEP:  [5 cmH20-15 cmH20] 5 cmH20   BP 136/70   Pulse (!) 110   Temp 97.9 F (36.6 C) (Axillary)   Resp (!) 34   Ht 5' 10.98" (1.803 m)   Wt 101.1 kg   SpO2 (!) 88%   BMI 31.10 kg/m    I/O last 3 completed shifts: In: 2457.5 [I.V.:1017.5; NG/GT:1440] Out: 4200 [Urine:3850; Stool:350] Total I/O In: -  Out: 2600 [Urine:2600]  SpO2: (!) 88 % O2 Flow Rate (L/min): 55 L/min FiO2 (%): 70 %  Estimated body mass index is 31.1 kg/m as calculated from the following:   Height as of this encounter: 5' 10.98" (1.803 m).   Weight as of this encounter: 101.1 kg.  Pressure Injury 12/05/19 Jaw Right Stage 2 -  Partial thickness loss of dermis presenting as a shallow open injury with a red, pink wound bed without slough. (Active)  12/05/19 0500  Location: Jaw  Location Orientation: Right  Staging: Stage 2 -  Partial thickness loss of dermis presenting as a shallow open injury with a red, pink wound bed without slough.  Wound Description (Comments):   Present on Admission: No     Pressure Injury 12/06/19 Face Right Stage 2 -  Partial thickness loss of dermis presenting as a shallow open injury with a red, pink wound bed without slough. (Active)  12/06/19 0730  Location: Face  Location Orientation: Right  Staging: Stage 2 -  Partial thickness loss of dermis presenting as a shallow open injury with a red, pink wound bed without slough.  Wound  Description (Comments):   Present on Admission: No     Pressure Injury 12/06/19 Penis Right;Distal Deep Tissue Pressure Injury - Purple or maroon localized area of discolored intact skin or blood-filled blister due to damage of underlying soft tissue from pressure and/or shear. (Active)  12/06/19 1030  Location: Penis  Location Orientation: Right;Distal  Staging: Deep Tissue Pressure Injury - Purple or maroon localized area of  discolored intact skin or blood-filled blister due to damage of underlying soft tissue from pressure and/or shear.  Wound Description (Comments):   Present on Admission:      Pressure Injury 12/06/19 Ear Right Stage 2 -  Partial thickness loss of dermis presenting as a shallow open injury with a red, pink wound bed without slough. (Active)  12/06/19 0800  Location: Ear  Location Orientation: Right  Staging: Stage 2 -  Partial thickness loss of dermis presenting as a shallow open injury with a red, pink wound bed without slough.  Wound Description (Comments):   Present on Admission: No     Pressure Injury 12/09/19 Face Left Stage 2 -  Partial thickness loss of dermis presenting as a shallow open injury with a red, pink wound bed without slough. skin tear under ETT holder adhesive device (Active)  12/09/19 1200  Location: Face  Location Orientation: Left  Staging: Stage 2 -  Partial thickness loss of dermis presenting as a shallow open injury with a red, pink wound bed without slough.  Wound Description (Comments): skin tear under ETT holder adhesive device  Present on Admission: No   PHYSICAL EXAMINATION:  GENERAL: Acutely ill appearing male, synchronous, mechanically ventilated HEAD: Normocephalic, atraumatic.  EYES: Pupils equal, round, sluggish to light.  No scleral icterus.  MOUTH: Orotracheally intubated, OG in place. NECK: Supple. Trachea midline. No JVD. Numerous areas of skin breakdown around neck and face from proning protocol PULMONARY: diminished breath sounds throughout, even, non labored, subcutaneous emphysema present bilateral upper chest  CARDIOVASCULAR:Regular rate and rhythm noted on monitor.  ABDOMEN: Soft, nondistended, hypoactive bowel sounds, rectal tube in place with watery brown stools, +scrotal edema MUSCULOSKELETAL: 2+ generalized edema   NEUROLOGIC: sedated, not following commands, PERRL GCS4T SKIN: facial pressure injuries, subcu emphysema, not  increasing.   Scheduled Meds: . vitamin C  1,000 mg Per Tube Daily  . chlorhexidine gluconate (MEDLINE KIT)  15 mL Mouth Rinse BID  . Chlorhexidine Gluconate Cloth  6 each Topical Daily  . dexamethasone (DECADRON) injection  6 mg Intravenous Q24H  . docusate  100 mg Per Tube BID  . enoxaparin (LOVENOX) injection  40 mg Subcutaneous Q24H  . famotidine  20 mg Per Tube BID  . feeding supplement (PROSource TF)  90 mL Per Tube BID  . free water  200 mL Per Tube Q4H  . furosemide  40 mg Intravenous Daily  . insulin aspart  0-15 Units Subcutaneous Q4H  . mouth rinse  15 mL Mouth Rinse 10 times per day  . polyethylene glycol  17 g Per Tube Daily  . sodium chloride flush  10-40 mL Intracatheter Q12H  . traZODone  100 mg Per Tube QHS  . zinc sulfate  220 mg Per Tube Daily   Continuous Infusions: . feeding supplement (VITAL AF 1.2 CAL) 60 mL/hr at 12/16/19 0600  . fentaNYL infusion INTRAVENOUS Stopped (12/16/19 0737)  . propofol (DIPRIVAN) infusion Stopped (12/16/19 0737)   PRN Meds:.acetaminophen, fentaNYL, ondansetron **OR** ondansetron (ZOFRAN) IV, sodium chloride flush, vecuronium      Indwelling Urinary Catheter continued, requirement  due to   Reason to continue Indwelling Urinary Catheter strict Intake/Output monitoring for hemodynamic instability   Central Line/ DC central line, PICC line 9/24  Reason to continue [employer] continued, requirement due to severe respiratory failure   Ventilator Sedation RASS -2 to -3      ASSESSMENT AND PLAN SYNOPSIS  Acute hypoxemic respiratory failure due to COVID-19 pneumonia / ARDS Mechanical ventilation via ARDS protocol, target mode per RT 6 cc/kg Wean PEEP and FiO2 as able Goal plateau pressure less than 30, driving pressure less than 15 Paralytics if necessary for vent synchrony, gas exchange Cycle prone positioning if necessary for oxygenation PAD protocol, goal RASS -2 to -3, currently requiring fentanyl and  propofol gtts  Diuresis as blood pressure and renal function can tolerate VAP prevention order set IV STEROIDS, continue Vitamin C, zinc   Acute toxic metabolic encephalopathy Ventilator discomfort sedation Goal RASS -2 to -3 Fentanyl/propofol  SHOCK Shock physiology has corrected  Thrombocytopenia -resolved Due to severe critical illness Bone marrow suppression from viral illness Currently on SCDs for DVT prophylaxis  ID Monitor fever/WBCs Trend procalcitonin as indicated  Prophylaxis DVT: SCDs GI: Famotidine   Severe protein calorie malnutrition Hypoalbuminemia Supplemental albumin Continue tube feeds   ENDO ICU hyperglycemia protocol   Multidisciplinary rounds were performed with the ICU team.    Critical care provider statement:    Critical care time (minutes):  33   Critical care time was exclusive of:  Separately billable procedures and  treating other patients   Critical care was necessary to treat or prevent imminent or  life-threatening deterioration of the following conditions:  acute hypoxemic respiratory failure.    Critical care was time spent personally by me on the following  activities:  Development of treatment plan with patient or surrogate,  discussions with consultants, evaluation of patient's response to  treatment, examination of patient, obtaining history from patient or  surrogate, ordering and performing treatments and interventions, ordering  and review of laboratory studies and re-evaluation of patient's condition   I assumed direction of critical care for this patient from another  provider in my specialty: no     Ottie Glazier, M.D.  Pulmonary & [place]

## 2019-12-17 ENCOUNTER — Inpatient Hospital Stay: Payer: HRSA Program

## 2019-12-17 LAB — CBC WITH DIFFERENTIAL/PLATELET
Abs Immature Granulocytes: 0.26 10*3/uL — ABNORMAL HIGH (ref 0.00–0.07)
Basophils Absolute: 0 10*3/uL (ref 0.0–0.1)
Basophils Relative: 0 %
Eosinophils Absolute: 0.1 10*3/uL (ref 0.0–0.5)
Eosinophils Relative: 1 %
HCT: 33 % — ABNORMAL LOW (ref 39.0–52.0)
Hemoglobin: 10 g/dL — ABNORMAL LOW (ref 13.0–17.0)
Immature Granulocytes: 3 %
Lymphocytes Relative: 4 %
Lymphs Abs: 0.3 10*3/uL — ABNORMAL LOW (ref 0.7–4.0)
MCH: 29.6 pg (ref 26.0–34.0)
MCHC: 30.3 g/dL (ref 30.0–36.0)
MCV: 97.6 fL (ref 80.0–100.0)
Monocytes Absolute: 0.4 10*3/uL (ref 0.1–1.0)
Monocytes Relative: 4 %
Neutro Abs: 7.6 10*3/uL (ref 1.7–7.7)
Neutrophils Relative %: 88 %
Platelets: 288 10*3/uL (ref 150–400)
RBC: 3.38 MIL/uL — ABNORMAL LOW (ref 4.22–5.81)
RDW: 18 % — ABNORMAL HIGH (ref 11.5–15.5)
WBC: 8.6 10*3/uL (ref 4.0–10.5)
nRBC: 0.6 % — ABNORMAL HIGH (ref 0.0–0.2)

## 2019-12-17 LAB — GLUCOSE, CAPILLARY
Glucose-Capillary: 102 mg/dL — ABNORMAL HIGH (ref 70–99)
Glucose-Capillary: 125 mg/dL — ABNORMAL HIGH (ref 70–99)
Glucose-Capillary: 106 mg/dL — ABNORMAL HIGH (ref 70–99)
Glucose-Capillary: 145 mg/dL — ABNORMAL HIGH (ref 70–99)
Glucose-Capillary: 172 mg/dL — ABNORMAL HIGH (ref 70–99)
Glucose-Capillary: 154 mg/dL — ABNORMAL HIGH (ref 70–99)

## 2019-12-17 LAB — MAGNESIUM: Magnesium: 2.2 mg/dL (ref 1.7–2.4)

## 2019-12-17 LAB — RENAL FUNCTION PANEL
Albumin: 2.5 g/dL — ABNORMAL LOW (ref 3.5–5.0)
Anion gap: 6 (ref 5–15)
BUN: 64 mg/dL — ABNORMAL HIGH (ref 8–23)
CO2: 44 mmol/L — ABNORMAL HIGH (ref 22–32)
Calcium: 8.9 mg/dL (ref 8.9–10.3)
Chloride: 101 mmol/L (ref 98–111)
Creatinine, Ser: 0.75 mg/dL (ref 0.61–1.24)
GFR calc Af Amer: >60 mL/min (ref >60)
GFR calc non Af Amer: >60 mL/min (ref >60)
Glucose, Bld: 129 mg/dL — ABNORMAL HIGH (ref 70–99)
Phosphorus: 3.4 mg/dL (ref 2.5–4.6)
Potassium: 4.1 mmol/L (ref 3.5–5.1)
Sodium: 151 mmol/L — ABNORMAL HIGH (ref 135–145)

## 2019-12-17 MED ORDER — FENTANYL CITRATE (PF) 100 MCG/2ML IJ SOLN
100.0000 ug | Freq: Once | INTRAMUSCULAR | Status: AC
  Administered 2019-12-17: 100 ug via INTRAVENOUS

## 2019-12-17 MED ORDER — VITAL 1.5 CAL PO LIQD
1000.0000 mL | ORAL | Status: DC
  Administered 2019-12-17 – 2019-12-20 (×5): 1000 mL

## 2019-12-17 MED ORDER — FENTANYL 2500MCG IN NS 250ML (10MCG/ML) PREMIX INFUSION
0.0000 ug/h | INTRAVENOUS | Status: DC
  Administered 2019-12-17: 50 ug/h via INTRAVENOUS

## 2019-12-17 MED ORDER — FENTANYL CITRATE (PF) 100 MCG/2ML IJ SOLN
INTRAMUSCULAR | Status: AC
Start: 2019-12-17 — End: 2019-12-17
  Filled 2019-12-17: qty 2

## 2019-12-17 MED ORDER — DEXAMETHASONE SODIUM PHOSPHATE 4 MG/ML IJ SOLN
4.0000 mg | INTRAMUSCULAR | Status: DC
  Administered 2019-12-18: 4 mg via INTRAVENOUS
  Filled 2019-12-17: qty 1

## 2019-12-17 NOTE — Progress Notes (Signed)
Nutrition Follow-up  DOCUMENTATION CODES:   Not applicable  INTERVENTION:  Initiate new goal TF regimen of Vital 1.5 Cal at 65 mL/hr (1560 mL goal daily volume) + PROSource TF 90 mL BID per tube. Provides 2500 kcal, 149 grams of protein, 1186 mL H2O daily.  NUTRITION DIAGNOSIS:   Inadequate oral intake related to inability to eat as evidenced by NPO status.  Ongoing.  GOAL:   Patient will meet greater than or equal to 90% of their needs  Met with TF regimen.  MONITOR:   Vent status, Labs, Weight trends, I & O's  REASON FOR ASSESSMENT:   Ventilator    ASSESSMENT:   59 year old male with chronic neck and back pain presented to [facility] on 9/2 for incarcerated ventral hernia and was found to have COVID-19.  9/10 transferred to ICU 9/15 intubated 9/17 initiated trickle TFs after RN advanced tube 9/18 TFs stopped as second vasopressor was added 9/20 TFs resumed and started advancing towards goal 9/29 OGT became dislodged, was removed and replaced  Patient is currently intubated on ventilator support MV: 15.6 L/min Temp (24hrs), Avg:98.5 F (36.9 C), Min:98.1 F (36.7 C), Max:98.9 F (37.2 C)  Medications reviewed and include: vitamin C 1000 mg daily, Decadron 6 mg Q24hrs IV, Colace 100 mg BID, famotidine, free water flush 200 mL Q4hrs, Lasix 40 mg daily IV, Novolog 0-15 units Q4hrs, Miralax 17 grams daily, zinc sulfate 220 mg daily.  Labs reviewed: CBG 102-125, Sodium 151, CO2 44, BUN 64.  I/O: 3550 mL UOP yesterday (1.5 mL/kg/hr)  Weight trend: 100.2 kg on 9/30; +11.8 kg from 9/10; likely from fluid status/edema as pt with moderate pitting edema  Enteral Access: 18 Fr. OGT placed 9/29; terminates in stomach per abdominal x-ray 9/30  TF regimen: Vital AF 1.2 Cal at 60 mL/hr + PROSource TF 90 mL BID  Discussed with RN and on rounds. Patient now off propofol gtt.  NUTRITION - FOCUSED PHYSICAL EXAM:    Most Recent Value  Orbital Region No depletion  Upper Arm  Region Unable to assess  Thoracic and Lumbar Region No depletion  Buccal Region Unable to assess  Temple Region Mild depletion  Clavicle Bone Region No depletion  Clavicle and Acromion Bone Region No depletion  Scapular Bone Region Unable to assess  Dorsal Hand Unable to assess  Patellar Region Mild depletion  Anterior Thigh Region Mild depletion  Posterior Calf Region Mild depletion  Edema (RD Assessment) Moderate  Hair Reviewed  Eyes Unable to assess  Mouth Unable to assess  Skin Reviewed  Nails Reviewed     Diet Order:   Diet Order            Diet NPO time specified  Diet effective now                EDUCATION NEEDS:   No education needs have been identified at this time  Skin:  Skin Assessment: Skin Integrity Issues: Skin Integrity Issues:: Unstageable DTI: N/A Stage II: N/A Unstageable: right ear (2cm x 0.3cm); left cheek (0.5cm x 0.4cm); right penis (3cm x 1.5cm)  Last BM:  12/17/2019 - medium type 7; rectal tube in place  Height:   Ht Readings from Last 1 Encounters:  12/10/19 5' 10.98" (1.803 m)   Weight:   Wt Readings from Last 1 Encounters:  12/17/19 100.2 kg   Ideal Body Weight:  78.2 kg  BMI:  Body mass index is 30.82 kg/m.  Estimated Nutritional Needs:   Kcal:  2564  Protein:  150-160 grams  Fluid:  >/= 2.3 L/day  Jacklynn Barnacle, MS, RD, LDN Pager number available on Amion

## 2019-12-17 NOTE — Progress Notes (Signed)
Patient dyschronous w/vent and increased work of breathing. Fentanyl bolus given x1 w/mild improvement. Decision made to start continuous Fentanyl gtt.   In addition to above, KUB ordered at beginning of shift which showed OGT was no longer in stomach. OGT removed. New OGT placed and repeat KUB confirmed proper placement in stomach. Tube feeds resumed.     Amanda Cockayne ACNP-BC

## 2019-12-17 NOTE — Progress Notes (Addendum)
[name] Kitchen CRITICAL CARE NOTE 59 y.o.malew/chronic neck and back pain whopresented to [facility] 9/2/2021for issues with an incarcerated ventral hernia. In the process of being evaluated the patient was noted to be hypoxic and tested postive for COVID-19.Patient was admitted for management of acute respiratory failure with hypoxia due to COVID-19 pneumonia. Patient transferred to the ICU/stepdown on 27 November 2019 due to increasing FiO2 requirements.  SIGNIFICANT EVENTS/TEST 9/14 progressive resp failure, patient decided to be placed on VENT 9/15 patient emergently intubated, RT CVL placed, LT FEM ART PLACED 9/16severe hypoxia, proned position, PATIENT IS DNR 9/17 severe hypoxia, plan for proning today Wife Rick Lowery has been updated daily 9/18 severe ARDS hypoxia, proned positioned, wife updated daily 9/19 remains proned, severe ARDS 9/20 ARDS, resp failure, severe ARDS, PRONED, wife updated daily 9/21 severe ARDS, resp failure, severe hypoxia 9/22 plan for proning today, severe ARDS, severe Hypoxia 9/23 pt unproned tolerating well with O2 sats @96 % 9/24 continue cycling proning/supine position 9/27- FiO2 60 peep 15 patient is DNR wife asking about tracheostomy.  I spoke to wife Rick Lowery today to give update, she understands that patient is doing poorly and is at day 12 of mechanical ventilation she wishes to speak to ENT regarding trache.   12/15/19- met with family at bedside, reviewed care plan. Sent consult request for ENT today. Patient with peripheral and scrotal edema during physical examination, will plan to gently diurese today. FiO2 up to 70% today, likely atelectatic changes vs fluid shift related due to several gtt.  9/29- patient on 70% fiO2, adequate urine output overnight. Spoke to wife at bedside, reviewed short term plan and ENT plan for poss trache.  12/17/19- patient remains on 70%.  Both daughters are in room, they had numerous questions and take many photos of things in ICU as well as  patients room, they were rude and very demanding with me and nursing staff today.  They did apologize for being rude but this has now been recurrent.  I've explained to them that we are all working very hard to help patient but its a severe type of COVID and patient with persistent bilateral infiltrates/fibrosis. He has been diuresed and has had no improvement in FiO2. Overall patient is with poor prognosis.  Called and updated wife Rick Lowery - no significant changes or improvement.   REVIEW OF SYSTEMS Unable to assess pt intubated mechanically ventilated  SUBJECTIVE: Unable to assess pt intubated mechanically ventilated  Vent Mode: PRVC FiO2 (%):  [70 %] 70 % Set Rate:  [16 bmp-35 bmp] 35 bmp Vt Set:  [400 mL-450 mL] 400 mL PEEP:  [5 cmH20-15 cmH20] 15 cmH20   BP (!) 120/58    Pulse 97    Temp 98.1 F (36.7 C) (Oral)    Resp (!) 35    Ht 5' 10.98" (1.803 m)    Wt 100.2 kg    SpO2 93%    BMI 30.82 kg/m    I/O last 3 completed shifts: In: 1448.8 [I.V.:458.8; NG/GT:990] Out: 5450 [Urine:4850; Stool:600] Total I/O In: -  Out: 1500 [Urine:1500]  SpO2: 93 % O2 Flow Rate (L/min): 55 L/min FiO2 (%): 70 %  Estimated body mass index is 30.82 kg/m as calculated from the following:   Height as of this encounter: 5' 10.98" (1.803 m).   Weight as of this encounter: 100.2 kg.  Pressure Injury 12/05/19 Jaw Right Stage 2 -  Partial thickness loss of dermis presenting as a shallow open injury with a red, pink wound bed without slough. (  Active)  12/05/19 0500  Location: Jaw  Location Orientation: Right  Staging: Stage 2 -  Partial thickness loss of dermis presenting as a shallow open injury with a red, pink wound bed without slough.  Wound Description (Comments):   Present on Admission: No     Pressure Injury 12/06/19 Face Right Stage 2 -  Partial thickness loss of dermis presenting as a shallow open injury with a red, pink wound bed without slough. (Active)  12/06/19 0730  Location: Face    Location Orientation: Right  Staging: Stage 2 -  Partial thickness loss of dermis presenting as a shallow open injury with a red, pink wound bed without slough.  Wound Description (Comments):   Present on Admission: No     Pressure Injury 12/06/19 Penis Right;Distal Deep Tissue Pressure Injury - Purple or maroon localized area of discolored intact skin or blood-filled blister due to damage of underlying soft tissue from pressure and/or shear. (Active)  12/06/19 1030  Location: Penis  Location Orientation: Right;Distal  Staging: Deep Tissue Pressure Injury - Purple or maroon localized area of discolored intact skin or blood-filled blister due to damage of underlying soft tissue from pressure and/or shear.  Wound Description (Comments):   Present on Admission:      Pressure Injury 12/06/19 Ear Right Stage 2 -  Partial thickness loss of dermis presenting as a shallow open injury with a red, pink wound bed without slough. (Active)  12/06/19 0800  Location: Ear  Location Orientation: Right  Staging: Stage 2 -  Partial thickness loss of dermis presenting as a shallow open injury with a red, pink wound bed without slough.  Wound Description (Comments):   Present on Admission: No     Pressure Injury 12/09/19 Face Left Stage 2 -  Partial thickness loss of dermis presenting as a shallow open injury with a red, pink wound bed without slough. skin tear under ETT holder adhesive device (Active)  12/09/19 1200  Location: Face  Location Orientation: Left  Staging: Stage 2 -  Partial thickness loss of dermis presenting as a shallow open injury with a red, pink wound bed without slough.  Wound Description (Comments): skin tear under ETT holder adhesive device  Present on Admission: No   PHYSICAL EXAMINATION:  GENERAL: Acutely ill appearing male, synchronous, mechanically ventilated HEAD: Normocephalic, atraumatic.  EYES: Pupils equal, round, sluggish to light.  No scleral icterus.  MOUTH:  Orotracheally intubated, OG in place. NECK: Supple. Trachea midline. No JVD. Numerous areas of skin breakdown around neck and face from proning protocol PULMONARY: diminished breath sounds throughout, even, non labored, subcutaneous emphysema present bilateral upper chest  CARDIOVASCULAR:Regular rate and rhythm noted on monitor.  ABDOMEN: Soft, nondistended, hypoactive bowel sounds, rectal tube in place with watery brown stools, +scrotal edema MUSCULOSKELETAL: 2+ generalized edema -slighly improved from previous NEUROLOGIC: sedated, not following commands, PERRL GCS4T SKIN: facial pressure injuries, subcu emphysema, not increasing.   Scheduled Meds:  vitamin C  1,000 mg Per Tube Daily   chlorhexidine gluconate (MEDLINE KIT)  15 mL Mouth Rinse BID   Chlorhexidine Gluconate Cloth  6 each Topical Daily   dexamethasone (DECADRON) injection  6 mg Intravenous Q24H   docusate  100 mg Per Tube BID   enoxaparin (LOVENOX) injection  40 mg Subcutaneous Q24H   famotidine  20 mg Per Tube BID   feeding supplement (PROSource TF)  90 mL Per Tube BID   free water  200 mL Per Tube Q4H   furosemide  40 mg  Intravenous Daily   insulin aspart  0-15 Units Subcutaneous Q4H   mouth rinse  15 mL Mouth Rinse 10 times per day   polyethylene glycol  17 g Per Tube Daily   sodium chloride flush  10-40 mL Intracatheter Q12H   traZODone  100 mg Per Tube QHS   zinc sulfate  220 mg Per Tube Daily   Continuous Infusions:  feeding supplement (VITAL AF 1.2 CAL) 60 mL/hr at 12/17/19 0600   fentaNYL infusion INTRAVENOUS Stopped (12/16/19 0720)   propofol (DIPRIVAN) infusion Stopped (12/16/19 0720)   PRN Meds:.acetaminophen, fentaNYL, ondansetron **OR** ondansetron (ZOFRAN) IV, sodium chloride flush, vecuronium      Indwelling Urinary Catheter continued, requirement due to   Reason to continue Indwelling Urinary Catheter strict Intake/Output monitoring for hemodynamic instability   Central Line/  DC central line, PICC line 9/24  Reason to continue  [place] continued, requirement due to severe respiratory failure   Ventilator Sedation RASS -2 to -3       12/17/27 - compared to 2 wks ago there appears to be very little change in chest x ray with ARDS/pneumonitis/fibrosis.    ASSESSMENT AND PLAN SYNOPSIS  Acute hypoxemic respiratory failure due to COVID-19 pneumonia / ARDS Mechanical ventilation via ARDS protocol, target mode per RT 6 cc/kg Wean PEEP and FiO2 as able Goal plateau pressure less than 30, driving pressure less than 15 Paralytics if necessary for vent synchrony, gas exchange Cycle prone positioning if necessary for oxygenation PAD protocol, goal RASS -2 to -3, currently requiring fentanyl and propofol gtts  Diuresis as blood pressure and renal function can tolerate VAP prevention order set IV STEROIDS, continue Vitamin C, zinc   Acute toxic metabolic encephalopathy Ventilator discomfort sedation Goal RASS -1 Fentanyl/propofol  SHOCK Shock physiology has corrected  Thrombocytopenia -resolved Due to severe critical illness Bone marrow suppression from viral illness Currently on SCDs for DVT prophylaxis  ID Monitor fever/WBCs Trend procalcitonin as indicated  Prophylaxis DVT: SCDs GI: Famotidine   Severe protein calorie malnutrition Hypoalbuminemia Supplemental albumin Continue tube feeds   ENDO ICU hyperglycemia protocol   Multidisciplinary rounds were performed with the ICU team.    Critical care provider statement:    Critical care time (minutes):  33   Critical care time was exclusive of:  Separately billable procedures and  treating other patients   Critical care was necessary to treat or prevent imminent or  life-threatening deterioration of the following conditions:  acute hypoxemic respiratory failure.    Critical care was time spent personally by me on the following  activities:  Development of treatment  plan with patient or surrogate,  discussions with consultants, evaluation of patient's response to  treatment, examination of patient, obtaining history from patient or  surrogate, ordering and performing treatments and interventions, ordering  and review of laboratory studies and re-evaluation of patient's condition   I assumed direction of critical care for this patient from another  provider in my specialty: no     Ottie Glazier, M.D.  Pulmonary & [place]

## 2019-12-18 LAB — BLOOD GAS, ARTERIAL
Acid-Base Excess: 25.6 mmol/L — ABNORMAL HIGH (ref 0.0–2.0)
Bicarbonate: 56.4 mmol/L — ABNORMAL HIGH (ref 20.0–28.0)
FIO2: 70
MECHVT: 400 mL
O2 Saturation: 94.1 %
PEEP: 15 cmH2O
Patient temperature: 37
RATE: 35 resp/min
pCO2 arterial: 107 mmHg (ref 32.0–48.0)
pH, Arterial: 7.33 — ABNORMAL LOW (ref 7.350–7.450)
pO2, Arterial: 76 mmHg — ABNORMAL LOW (ref 83.0–108.0)

## 2019-12-18 LAB — CBC WITH DIFFERENTIAL/PLATELET
Abs Immature Granulocytes: 0.21 10*3/uL — ABNORMAL HIGH (ref 0.00–0.07)
Basophils Absolute: 0 10*3/uL (ref 0.0–0.1)
Basophils Relative: 0 %
Eosinophils Absolute: 0 10*3/uL (ref 0.0–0.5)
Eosinophils Relative: 1 %
HCT: 34.8 % — ABNORMAL LOW (ref 39.0–52.0)
Hemoglobin: 10 g/dL — ABNORMAL LOW (ref 13.0–17.0)
Immature Granulocytes: 4 %
Lymphocytes Relative: 5 %
Lymphs Abs: 0.3 10*3/uL — ABNORMAL LOW (ref 0.7–4.0)
MCH: 28.9 pg (ref 26.0–34.0)
MCHC: 28.7 g/dL — ABNORMAL LOW (ref 30.0–36.0)
MCV: 100.6 fL — ABNORMAL HIGH (ref 80.0–100.0)
Monocytes Absolute: 0.3 10*3/uL (ref 0.1–1.0)
Monocytes Relative: 6 %
Neutro Abs: 5.1 10*3/uL (ref 1.7–7.7)
Neutrophils Relative %: 84 %
Platelets: 265 10*3/uL (ref 150–400)
RBC: 3.46 MIL/uL — ABNORMAL LOW (ref 4.22–5.81)
RDW: 17.9 % — ABNORMAL HIGH (ref 11.5–15.5)
WBC: 6 10*3/uL (ref 4.0–10.5)
nRBC: 0.3 % — ABNORMAL HIGH (ref 0.0–0.2)

## 2019-12-18 LAB — RENAL FUNCTION PANEL
Albumin: 2.4 g/dL — ABNORMAL LOW (ref 3.5–5.0)
Anion gap: 12 (ref 5–15)
BUN: 59 mg/dL — ABNORMAL HIGH (ref 8–23)
CO2: 41 mmol/L — ABNORMAL HIGH (ref 22–32)
Calcium: 8.7 mg/dL — ABNORMAL LOW (ref 8.9–10.3)
Chloride: 100 mmol/L (ref 98–111)
Creatinine, Ser: 0.66 mg/dL (ref 0.61–1.24)
GFR calc Af Amer: >60 mL/min (ref >60)
GFR calc non Af Amer: >60 mL/min (ref >60)
Glucose, Bld: 162 mg/dL — ABNORMAL HIGH (ref 70–99)
Phosphorus: 2.8 mg/dL (ref 2.5–4.6)
Potassium: 4.1 mmol/L (ref 3.5–5.1)
Sodium: 153 mmol/L — ABNORMAL HIGH (ref 135–145)

## 2019-12-18 LAB — GLUCOSE, CAPILLARY
Glucose-Capillary: 117 mg/dL — ABNORMAL HIGH (ref 70–99)
Glucose-Capillary: 126 mg/dL — ABNORMAL HIGH (ref 70–99)
Glucose-Capillary: 129 mg/dL — ABNORMAL HIGH (ref 70–99)
Glucose-Capillary: 131 mg/dL — ABNORMAL HIGH (ref 70–99)
Glucose-Capillary: 140 mg/dL — ABNORMAL HIGH (ref 70–99)
Glucose-Capillary: 142 mg/dL — ABNORMAL HIGH (ref 70–99)

## 2019-12-18 LAB — SODIUM
Sodium: 151 mmol/L — ABNORMAL HIGH (ref 135–145)
Sodium: 137 mmol/L (ref 135–145)

## 2019-12-18 LAB — MAGNESIUM: Magnesium: 2.3 mg/dL (ref 1.7–2.4)

## 2019-12-18 MED ORDER — DEXTROSE 5 % IV SOLN: INTRAVENOUS | Status: DC

## 2019-12-18 MED ORDER — FREE WATER
300.0000 mL | Status: DC
  Administered 2019-12-18 – 2019-12-20 (×15): 300 mL

## 2019-12-18 NOTE — Progress Notes (Signed)
[name] Kitchen CRITICAL CARE NOTE 59 y.o.malew/chronic neck and back pain whopresented to [facility] 9/2/2021for issues with an incarcerated ventral hernia. In the process of being evaluated the patient was noted to be hypoxic and tested postive for COVID-19.Patient was admitted for management of acute respiratory failure with hypoxia due to COVID-19 pneumonia. Patient transferred to the ICU/stepdown on 27 November 2019 due to increasing FiO2 requirements.  SIGNIFICANT EVENTS/TEST 9/14 progressive resp failure, patient decided to be placed on VENT 9/15 patient emergently intubated, RT CVL placed, LT FEM ART PLACED 9/16severe hypoxia, proned position, PATIENT IS DNR 9/17 severe hypoxia, plan for proning today Wife [name] has been updated daily 9/18 severe ARDS hypoxia, proned positioned, wife updated daily 9/19 remains proned, severe ARDS 9/20 ARDS, resp failure, severe ARDS, PRONED, wife updated daily 9/21 severe ARDS, resp failure, severe hypoxia 9/22 plan for proning today, severe ARDS, severe Hypoxia 9/23 pt unproned tolerating well with O2 sats _0 % 9/24 continue cycling proning/supine position 9/27- FiO2 60 peep 15 patient is DNR wife asking about tracheostomy.  I spoke to wife [name] today to give update, she understands that patient is doing poorly and is at day 12 of mechanical ventilation she wishes to speak to ENT regarding trache.   12/15/19- met with family at bedside, reviewed care plan. Sent consult request for ENT today. Patient with peripheral and scrotal edema during physical examination, will plan to gently diurese today. FiO2 up to 70% today, likely atelectatic changes vs fluid shift related due to several gtt.  9/29- patient on 70% fiO2, adequate urine output overnight. Spoke to wife at bedside, reviewed short term plan and ENT plan for poss trache.  12/17/19- patient remains on 70%.  Both daughters are in room, they had numerous questions and take many photos of things in ICU as well as  patients room, they were rude and very demanding with me and nursing staff today.  They did apologize for being rude but this has now been recurrent.  I've explained to them that we are all working very hard to help patient but its a severe type of COVID and patient with persistent bilateral infiltrates/fibrosis. He has been diuresed and has had no improvement in FiO2. Overall patient is with poor prognosis.  Called and updated wife [name] - no significant changes or improvement.  12/18/19- patient remains critically ill. He is weaned to 60% today on ventilator FiO2. No lower ext edema noted on exam.  +rhonchrous breath sounds.   REVIEW OF SYSTEMS Unable to assess pt intubated mechanically ventilated  SUBJECTIVE: Unable to assess pt intubated mechanically ventilated  Vent Mode: PRVC FiO2 (%):  [60 %-70 %] 60 % Set Rate:  [30 bmp-35 bmp] 30 bmp Vt Set:  [400 mL-450 mL] 450 mL PEEP:  [13 cmH20-15 cmH20] 13 cmH20 Plateau Pressure:  [23 cmH20-35 cmH20] 23 cmH20   BP 138/69   Pulse 88   Temp 99.3 F (37.4 C)   Resp (!) 28   Ht 5' 10.98" (1.803 m)   Wt 101.2 kg   SpO2 91%   BMI 31.13 kg/m    I/O last 3 completed shifts: In: 8615.5 [I.V.:150; NG/GT:8465.4] Out: 3600 [Urine:3300; Stool:300] No intake/output data recorded.  SpO2: 91 % O2 Flow Rate (L/min): 55 L/min FiO2 (%): 60 %  Estimated body mass index is 31.13 kg/m as calculated from the following:   Height as of this encounter: 5' 10.98" (1.803 m).   Weight as of this encounter: 101.2 kg.  Pressure Injury 12/05/19 Jaw Right Stage  2 -  Partial thickness loss of dermis presenting as a shallow open injury with a red, pink wound bed without slough. (Active)  12/05/19 0500  Location: Jaw  Location Orientation: Right  Staging: Stage 2 -  Partial thickness loss of dermis presenting as a shallow open injury with a red, pink wound bed without slough.  Wound Description (Comments):   Present on Admission: No     Pressure Injury  12/06/19 Face Right Stage 2 -  Partial thickness loss of dermis presenting as a shallow open injury with a red, pink wound bed without slough. (Active)  12/06/19 0730  Location: Face  Location Orientation: Right  Staging: Stage 2 -  Partial thickness loss of dermis presenting as a shallow open injury with a red, pink wound bed without slough.  Wound Description (Comments):   Present on Admission: No     Pressure Injury 12/06/19 Penis Right;Distal Deep Tissue Pressure Injury - Purple or maroon localized area of discolored intact skin or blood-filled blister due to damage of underlying soft tissue from pressure and/or shear. (Active)  12/06/19 1030  Location: Penis  Location Orientation: Right;Distal  Staging: Deep Tissue Pressure Injury - Purple or maroon localized area of discolored intact skin or blood-filled blister due to damage of underlying soft tissue from pressure and/or shear.  Wound Description (Comments):   Present on Admission:      Pressure Injury 12/06/19 Ear Right Stage 2 -  Partial thickness loss of dermis presenting as a shallow open injury with a red, pink wound bed without slough. (Active)  12/06/19 0800  Location: Ear  Location Orientation: Right  Staging: Stage 2 -  Partial thickness loss of dermis presenting as a shallow open injury with a red, pink wound bed without slough.  Wound Description (Comments):   Present on Admission: No     Pressure Injury 12/09/19 Face Left Stage 2 -  Partial thickness loss of dermis presenting as a shallow open injury with a red, pink wound bed without slough. skin tear under ETT holder adhesive device (Active)  12/09/19 1200  Location: Face  Location Orientation: Left  Staging: Stage 2 -  Partial thickness loss of dermis presenting as a shallow open injury with a red, pink wound bed without slough.  Wound Description (Comments): skin tear under ETT holder adhesive device  Present on Admission: No   PHYSICAL EXAMINATION:  GENERAL:  Acutely ill appearing male, synchronous, mechanically ventilated HEAD: Normocephalic, atraumatic.  EYES: Pupils equal, round, sluggish to light.  No scleral icterus.  MOUTH: Orotracheally intubated, OG in place. NECK: Supple. Trachea midline. No JVD. Numerous areas of skin breakdown around neck and face from proning protocol PULMONARY: diminished breath sounds throughout, even, non labored, subcutaneous emphysema present bilateral upper chest, rhonchi bialteraly CARDIOVASCULAR:Regular rate and rhythm noted on monitor.  ABDOMEN: Soft, nondistended, hypoactive bowel sounds, rectal tube in place with watery brown stools, +scrotal edema MUSCULOSKELETAL: resolution of LE edema NEUROLOGIC: sedated, not following commands, PERRL GCS4T SKIN: facial pressure injuries, subcu emphysema, not increasing.   Scheduled Meds: . vitamin C  1,000 mg Per Tube Daily  . chlorhexidine gluconate (MEDLINE KIT)  15 mL Mouth Rinse BID  . Chlorhexidine Gluconate Cloth  6 each Topical Daily  . dexamethasone (DECADRON) injection  4 mg Intravenous Q24H  . docusate  100 mg Per Tube BID  . enoxaparin (LOVENOX) injection  40 mg Subcutaneous Q24H  . famotidine  20 mg Per Tube BID  . feeding supplement (PROSource TF)  90 mL  Per Tube BID  . free water  300 mL Per Tube Q4H  . insulin aspart  0-15 Units Subcutaneous Q4H  . mouth rinse  15 mL Mouth Rinse 10 times per day  . polyethylene glycol  17 g Per Tube Daily  . sodium chloride flush  10-40 mL Intracatheter Q12H  . zinc sulfate  220 mg Per Tube Daily   Continuous Infusions: . dextrose 60 mL/hr at 12/18/19 1109  . feeding supplement (VITAL 1.5 CAL) 1,000 mL (12/18/19 1102)  . fentaNYL infusion INTRAVENOUS 50 mcg/hr (12/18/19 0600)  . propofol (DIPRIVAN) infusion Stopped (12/16/19 0720)   PRN Meds:.acetaminophen, fentaNYL, ondansetron **OR** ondansetron (ZOFRAN) IV, sodium chloride flush      Indwelling Urinary Catheter continued, requirement due to   Reason to  continue Indwelling Urinary Catheter strict Intake/Output monitoring for hemodynamic instability   Central Line/ DC central line, PICC line 9/24  Reason to continue [place] [employer] [place] continued, requirement due to severe respiratory failure   Ventilator Sedation RASS -2 to -3       12/17/27 - compared to 2 wks ago there appears to be very little change in chest x ray with ARDS/pneumonitis/fibrosis.    ASSESSMENT AND PLAN SYNOPSIS  Acute hypoxemic respiratory failure due to COVID-19 pneumonia / ARDS Mechanical ventilation via ARDS protocol, target mode per RT 6 cc/kg Wean PEEP and FiO2 as able Goal plateau pressure less than 30, driving pressure less than 15 Paralytics if necessary for vent synchrony, gas exchange Cycle prone positioning if necessary for oxygenation PAD protocol, goal RASS -2 to -3, currently requiring fentanyl and propofol gtts  Diuresis as blood pressure and renal function can tolerate VAP prevention order set IV STEROIDS dcd now that patient is 1 month out of acute illness Vitamin C, zinc   Acute toxic metabolic encephalopathy Ventilator discomfort sedation Goal RASS -1 Fentanyl/propofol  SHOCK Shock physiology has corrected  Thrombocytopenia -resolved Due to severe critical illness Bone marrow suppression from viral illness Currently on SCDs for DVT prophylaxis  ID Monitor fever/WBCs Trend procalcitonin as indicated  Prophylaxis DVT: SCDs GI: Famotidine   Severe protein calorie malnutrition Hypoalbuminemia Supplemental albumin Continue tube feeds   ENDO ICU hyperglycemia protocol   Multidisciplinary rounds were performed with the ICU team.    Critical care provider statement:    Critical care time (minutes):  33   Critical care time was exclusive of:  Separately billable procedures and  treating other patients   Critical care was necessary to treat or prevent imminent or  life-threatening deterioration of the following  conditions:  acute hypoxemic respiratory failure.    Critical care was time spent personally by me on the following  activities:  Development of treatment plan with patient or surrogate,  discussions with consultants, evaluation of patient's response to  treatment, examination of patient, obtaining history from patient or  surrogate, ordering and performing treatments and interventions, ordering  and review of laboratory studies and re-evaluation of patient's condition   I assumed direction of critical care for this patient from another  provider in my specialty: no     Ottie Glazier, M.D.  Pulmonary & [place]

## 2019-12-18 NOTE — Progress Notes (Signed)
SPoke with Dr A, sodium level 137. Orders to stop the dextrose and free water flushes.

## 2019-12-18 NOTE — Progress Notes (Addendum)
CSW was informed that patient's wife is asking for assistance with getting FMLA Paperwork completed. Patient has no PCP and is currently on a ventilator. Following-up with Leadership, ICU MD, and [facility] on-call MD for suggestions.   Patient's wife also asking for help with POA paperwork. CSW asked for RN to submit consult for Chaplain.  Citlalli Weikel, LCSW

## 2019-12-18 DEATH — deceased

## 2019-12-19 LAB — SODIUM
Sodium: 151 mmol/L — ABNORMAL HIGH (ref 135–145)
Sodium: 151 mmol/L — ABNORMAL HIGH (ref 135–145)
Sodium: 153 mmol/L — ABNORMAL HIGH (ref 135–145)
Sodium: 153 mmol/L — ABNORMAL HIGH (ref 135–145)
Sodium: 154 mmol/L — ABNORMAL HIGH (ref 135–145)

## 2019-12-19 LAB — GLUCOSE, CAPILLARY
Glucose-Capillary: 122 mg/dL — ABNORMAL HIGH (ref 70–99)
Glucose-Capillary: 116 mg/dL — ABNORMAL HIGH (ref 70–99)
Glucose-Capillary: 114 mg/dL — ABNORMAL HIGH (ref 70–99)
Glucose-Capillary: 140 mg/dL — ABNORMAL HIGH (ref 70–99)
Glucose-Capillary: 121 mg/dL — ABNORMAL HIGH (ref 70–99)
Glucose-Capillary: 95 mg/dL (ref 70–99)

## 2019-12-19 LAB — CBC WITH DIFFERENTIAL/PLATELET
Abs Immature Granulocytes: 0.18 10*3/uL — ABNORMAL HIGH (ref 0.00–0.07)
Basophils Absolute: 0 10*3/uL (ref 0.0–0.1)
Basophils Relative: 0 %
Eosinophils Absolute: 0.1 10*3/uL (ref 0.0–0.5)
Eosinophils Relative: 2 %
HCT: 35.2 % — ABNORMAL LOW (ref 39.0–52.0)
Hemoglobin: 10.2 g/dL — ABNORMAL LOW (ref 13.0–17.0)
Immature Granulocytes: 2 %
Lymphocytes Relative: 4 %
Lymphs Abs: 0.3 10*3/uL — ABNORMAL LOW (ref 0.7–4.0)
MCH: 29.1 pg (ref 26.0–34.0)
MCHC: 29 g/dL — ABNORMAL LOW (ref 30.0–36.0)
MCV: 100.3 fL — ABNORMAL HIGH (ref 80.0–100.0)
Monocytes Absolute: 0.4 10*3/uL (ref 0.1–1.0)
Monocytes Relative: 5 %
Neutro Abs: 6.4 10*3/uL (ref 1.7–7.7)
Neutrophils Relative %: 87 %
Platelets: 288 10*3/uL (ref 150–400)
RBC: 3.51 MIL/uL — ABNORMAL LOW (ref 4.22–5.81)
RDW: 18.4 % — ABNORMAL HIGH (ref 11.5–15.5)
WBC: 7.4 10*3/uL (ref 4.0–10.5)
nRBC: 0.4 % — ABNORMAL HIGH (ref 0.0–0.2)

## 2019-12-19 LAB — RENAL FUNCTION PANEL
Albumin: 2.2 g/dL — ABNORMAL LOW (ref 3.5–5.0)
Anion gap: 10 (ref 5–15)
BUN: 53 mg/dL — ABNORMAL HIGH (ref 8–23)
CO2: 41 mmol/L — ABNORMAL HIGH (ref 22–32)
Calcium: 8.6 mg/dL — ABNORMAL LOW (ref 8.9–10.3)
Chloride: 100 mmol/L (ref 98–111)
Creatinine, Ser: 0.51 mg/dL — ABNORMAL LOW (ref 0.61–1.24)
GFR calc Af Amer: >60 mL/min (ref >60)
GFR calc non Af Amer: >60 mL/min (ref >60)
Glucose, Bld: 124 mg/dL — ABNORMAL HIGH (ref 70–99)
Phosphorus: 2.3 mg/dL — ABNORMAL LOW (ref 2.5–4.6)
Potassium: 3.8 mmol/L (ref 3.5–5.1)
Sodium: 151 mmol/L — ABNORMAL HIGH (ref 135–145)

## 2019-12-19 LAB — MAGNESIUM: Magnesium: 2.1 mg/dL (ref 1.7–2.4)

## 2019-12-19 MED ORDER — MIDAZOLAM HCL 2 MG/2ML IJ SOLN
INTRAMUSCULAR | Status: AC
  Filled 2019-12-19: qty 4

## 2019-12-19 MED ORDER — MIDAZOLAM HCL 2 MG/2ML IJ SOLN
4.0000 mg | Freq: Once | INTRAMUSCULAR | Status: AC
  Administered 2019-12-19: 4 mg via INTRAVENOUS

## 2019-12-19 NOTE — Progress Notes (Signed)
VAST consult for Midline placement per Dr.Aleskerov.

## 2019-12-19 NOTE — Progress Notes (Signed)
P 

## 2019-12-19 NOTE — Progress Notes (Signed)
Midline consult: discussed with RN. Per RN and charge RN, cancel midline for now.

## 2019-12-19 NOTE — Progress Notes (Signed)
[name] Kitchen CRITICAL CARE NOTE 59 y.o.malew/chronic neck and back pain whopresented to [facility] 9/2/2021for issues with an incarcerated ventral hernia. In the process of being evaluated the patient was noted to be hypoxic and tested postive for COVID-19.Patient was admitted for management of acute respiratory failure with hypoxia due to COVID-19 pneumonia. Patient transferred to the ICU/stepdown on 27 November 2019 due to increasing FiO2 requirements.  SIGNIFICANT EVENTS/TEST 9/14 progressive resp failure, patient decided to be placed on VENT 9/15 patient emergently intubated, RT CVL placed, LT FEM ART PLACED 9/16severe hypoxia, proned position, PATIENT IS DNR 9/17 severe hypoxia, plan for proning today Wife [name] has been updated daily 9/18 severe ARDS hypoxia, proned positioned, wife updated daily 9/19 remains proned, severe ARDS 9/20 ARDS, resp failure, severe ARDS, PRONED, wife updated daily 9/21 severe ARDS, resp failure, severe hypoxia 9/22 plan for proning today, severe ARDS, severe Hypoxia 9/23 pt unproned tolerating well with O2 sats _0 % 9/24 continue cycling proning/supine position 9/27- FiO2 60 peep 15 patient is DNR wife asking about tracheostomy.  I spoke to wife [name] today to give update, she understands that patient is doing poorly and is at day 12 of mechanical ventilation she wishes to speak to ENT regarding trache.   12/15/19- met with family at bedside, reviewed care plan. Sent consult request for ENT today. Patient with peripheral and scrotal edema during physical examination, will plan to gently diurese today. FiO2 up to 70% today, likely atelectatic changes vs fluid shift related due to several gtt.  9/29- patient on 70% fiO2, adequate urine output overnight. Spoke to wife at bedside, reviewed short term plan and ENT plan for poss trache.  12/17/19- patient remains on 70%.  Both daughters are in room, they had numerous questions and take many photos of things in ICU as well as  patients room, they were rude and very demanding with me and nursing staff today.  They did apologize for being rude but this has now been recurrent.  I've explained to them that we are all working very hard to help patient but its a severe type of COVID and patient with persistent bilateral infiltrates/fibrosis. He has been diuresed and has had no improvement in FiO2. Overall patient is with poor prognosis.  Called and updated wife [name] - no significant changes or improvement.  12/18/19- patient remains critically ill. He is weaned to 60% today on ventilator FiO2. No lower ext edema noted on exam.  +rhonchrous breath sounds.  12/19/19- patient on 90%Fio2 today, remains crtically ill on MV. No appreciable improvement >30days. I met at bedside with Son Lurena Joiner and Wife [name] today to review overall poor prognosis and we discussed that at this point there is essentially no additional medication that we can offer to salvage patients life and prognosis is poor.   REVIEW OF SYSTEMS Unable to assess pt intubated mechanically ventilated  SUBJECTIVE: Unable to assess pt intubated mechanically ventilated  Vent Mode: PRVC FiO2 (%):  [60 %-90 %] 90 % Set Rate:  [30 bmp] 30 bmp Vt Set:  [450 mL] 450 mL PEEP:  [10 cmH20-13 cmH20] 10 cmH20 Plateau Pressure:  [24 cmH20-26 cmH20] 26 cmH20   BP 135/61   Pulse 79   Temp (!) (P) 96.4 F (35.8 C) (Esophageal)   Resp (!) 28   Ht 5' 10.98" (1.803 m)   Wt 101 kg   SpO2 90%   BMI 31.07 kg/m    I/O last 3 completed shifts: In: 1477.7 [I.V.:697.7; NG/GT:780] Out: 2375 [Urine:2150; Stool:225] Total  I/O In: 208.4 [I.V.:208.4] Out: 425 [Urine:425]  SpO2: 90 % O2 Flow Rate (L/min): 55 L/min FiO2 (%): 90 %  Estimated body mass index is 31.07 kg/m as calculated from the following:   Height as of this encounter: 5' 10.98" (1.803 m).   Weight as of this encounter: 101 kg.  Pressure Injury 12/05/19 Jaw Right Stage 2 -  Partial thickness loss of dermis  presenting as a shallow open injury with a red, pink wound bed without slough. (Active)  12/05/19 0500  Location: Jaw  Location Orientation: Right  Staging: Stage 2 -  Partial thickness loss of dermis presenting as a shallow open injury with a red, pink wound bed without slough.  Wound Description (Comments):   Present on Admission: No     Pressure Injury 12/06/19 Face Right Stage 2 -  Partial thickness loss of dermis presenting as a shallow open injury with a red, pink wound bed without slough. (Active)  12/06/19 0730  Location: Face  Location Orientation: Right  Staging: Stage 2 -  Partial thickness loss of dermis presenting as a shallow open injury with a red, pink wound bed without slough.  Wound Description (Comments):   Present on Admission: No     Pressure Injury 12/06/19 Penis Right;Distal Deep Tissue Pressure Injury - Purple or maroon localized area of discolored intact skin or blood-filled blister due to damage of underlying soft tissue from pressure and/or shear. (Active)  12/06/19 1030  Location: Penis  Location Orientation: Right;Distal  Staging: Deep Tissue Pressure Injury - Purple or maroon localized area of discolored intact skin or blood-filled blister due to damage of underlying soft tissue from pressure and/or shear.  Wound Description (Comments):   Present on Admission:      Pressure Injury 12/06/19 Ear Right Stage 2 -  Partial thickness loss of dermis presenting as a shallow open injury with a red, pink wound bed without slough. (Active)  12/06/19 0800  Location: Ear  Location Orientation: Right  Staging: Stage 2 -  Partial thickness loss of dermis presenting as a shallow open injury with a red, pink wound bed without slough.  Wound Description (Comments):   Present on Admission: No     Pressure Injury 12/09/19 Face Left Stage 2 -  Partial thickness loss of dermis presenting as a shallow open injury with a red, pink wound bed without slough. skin tear under ETT  holder adhesive device (Active)  12/09/19 1200  Location: Face  Location Orientation: Left  Staging: Stage 2 -  Partial thickness loss of dermis presenting as a shallow open injury with a red, pink wound bed without slough.  Wound Description (Comments): skin tear under ETT holder adhesive device  Present on Admission: No   PHYSICAL EXAMINATION:  GENERAL: Acutely ill appearing male, synchronous, mechanically ventilated HEAD: Normocephalic, atraumatic.  EYES: Pupils equal, round, sluggish to light.  No scleral icterus.  MOUTH: Orotracheally intubated, OG in place. NECK: Supple. Trachea midline. No JVD. Numerous areas of skin breakdown around neck and face from proning protocol PULMONARY: diminished breath sounds throughout, even, non labored, subcutaneous emphysema present bilateral upper chest, rhonchi bialteraly CARDIOVASCULAR:Regular rate and rhythm noted on monitor.  ABDOMEN: Soft, nondistended, hypoactive bowel sounds, rectal tube in place with watery brown stools, +scrotal edema MUSCULOSKELETAL: resolution of LE edema NEUROLOGIC: sedated, not following commands, PERRL GCS4T SKIN: facial pressure injuries, subcu emphysema, not increasing.   Scheduled Meds: . vitamin C  1,000 mg Per Tube Daily  . chlorhexidine gluconate (MEDLINE KIT)  15  mL Mouth Rinse BID  . Chlorhexidine Gluconate Cloth  6 each Topical Daily  . docusate  100 mg Per Tube BID  . enoxaparin (LOVENOX) injection  40 mg Subcutaneous Q24H  . famotidine  20 mg Per Tube BID  . feeding supplement (PROSource TF)  90 mL Per Tube BID  . free water  300 mL Per Tube Q4H  . insulin aspart  0-15 Units Subcutaneous Q4H  . mouth rinse  15 mL Mouth Rinse 10 times per day  . polyethylene glycol  17 g Per Tube Daily  . sodium chloride flush  10-40 mL Intracatheter Q12H  . zinc sulfate  220 mg Per Tube Daily   Continuous Infusions: . dextrose Stopped (12/18/19 1750)  . feeding supplement (VITAL 1.5 CAL) 1,000 mL (12/19/19 0508)   . fentaNYL infusion INTRAVENOUS 300 mcg/hr (12/19/19 1230)  . propofol (DIPRIVAN) infusion Stopped (12/16/19 0720)   PRN Meds:.acetaminophen, fentaNYL, ondansetron **OR** ondansetron (ZOFRAN) IV, sodium chloride flush      Indwelling Urinary Catheter continued, requirement due to   Reason to continue Indwelling Urinary Catheter strict Intake/Output monitoring for hemodynamic instability   Central Line/ DC central line, PICC line 9/24  Reason to continue Central Line   Ventilator continued, requirement due to severe respiratory failure   Ventilator Sedation RASS -2 to -3       12/17/27 - compared to 2 wks ago there appears to be very little change in chest x ray with ARDS/pneumonitis/fibrosis.    ASSESSMENT AND PLAN SYNOPSIS  Acute hypoxemic respiratory failure due to COVID-19 pneumonia / ARDS Mechanical ventilation via ARDS protocol, target mode per RT 6 cc/kg Wean PEEP and FiO2 as able Goal plateau pressure less than 30, driving pressure less than 15 Paralytics if necessary for vent synchrony, gas exchange Cycle prone positioning if necessary for oxygenation PAD protocol, goal RASS -2 to -3, currently requiring fentanyl and propofol gtts  Diuresis as blood pressure and renal function can tolerate VAP prevention order set IV STEROIDS dcd now that patient is 1 month out of acute illness Vitamin C, zinc   Acute toxic metabolic encephalopathy Ventilator discomfort sedation Goal RASS -1 Fentanyl/propofol  SHOCK Shock physiology has corrected  Thrombocytopenia -resolved Due to severe critical illness Bone marrow suppression from viral illness Currently on SCDs for DVT prophylaxis  ID Monitor fever/WBCs Trend procalcitonin as indicated  Prophylaxis DVT: SCDs GI: Famotidine   Severe protein calorie malnutrition Hypoalbuminemia Supplemental albumin Continue tube feeds   ENDO ICU hyperglycemia protocol   Multidisciplinary rounds were performed with the  ICU team.    Critical care provider statement:    Critical care time (minutes):  33   Critical care time was exclusive of:  Separately billable procedures and  treating other patients   Critical care was necessary to treat or prevent imminent or  life-threatening deterioration of the following conditions:  acute hypoxemic respiratory failure.    Critical care was time spent personally by me on the following  activities:  Development of treatment plan with patient or surrogate,  discussions with consultants, evaluation of patient's response to  treatment, examination of patient, obtaining history from patient or  surrogate, ordering and performing treatments and interventions, ordering  and review of laboratory studies and re-evaluation of patient's condition   I assumed direction of critical care for this patient from another  provider in my specialty: no     Fuad Aleskerov, M.D.  Pulmonary & Critical Care Medicine  [facility]   

## 2019-12-19 NOTE — Progress Notes (Signed)
Pt oxygen de-sating into 40's when proned this morning at approximately 0900. Placed back in supine position and vent settings adjusted by RT. Responds to pain this shift. Has gag reflex. Unable to follow commands. Wife and daughter at bedside. Asking questions that are out of scope of practice for this RN. Offered to call MD to bedside, Family declined. Will continue to monitor and treat.

## 2019-12-20 LAB — RENAL FUNCTION PANEL
Albumin: 2.3 g/dL — ABNORMAL LOW (ref 3.5–5.0)
Anion gap: 8 (ref 5–15)
BUN: 56 mg/dL — ABNORMAL HIGH (ref 8–23)
CO2: 44 mmol/L — ABNORMAL HIGH (ref 22–32)
Calcium: 8.7 mg/dL — ABNORMAL LOW (ref 8.9–10.3)
Chloride: 103 mmol/L (ref 98–111)
Creatinine, Ser: 0.55 mg/dL — ABNORMAL LOW (ref 0.61–1.24)
GFR calc Af Amer: >60 mL/min (ref >60)
GFR calc non Af Amer: >60 mL/min (ref >60)
Glucose, Bld: 98 mg/dL (ref 70–99)
Phosphorus: 2.8 mg/dL (ref 2.5–4.6)
Potassium: 4.4 mmol/L (ref 3.5–5.1)
Sodium: 155 mmol/L — ABNORMAL HIGH (ref 135–145)

## 2019-12-20 LAB — CBC WITH DIFFERENTIAL/PLATELET
Abs Immature Granulocytes: 0.2 10*3/uL — ABNORMAL HIGH (ref 0.00–0.07)
Basophils Absolute: 0 10*3/uL (ref 0.0–0.1)
Basophils Relative: 0 %
Eosinophils Absolute: 0.2 10*3/uL (ref 0.0–0.5)
Eosinophils Relative: 3 %
HCT: 34.5 % — ABNORMAL LOW (ref 39.0–52.0)
Hemoglobin: 10.5 g/dL — ABNORMAL LOW (ref 13.0–17.0)
Immature Granulocytes: 3 %
Lymphocytes Relative: 7 %
Lymphs Abs: 0.4 10*3/uL — ABNORMAL LOW (ref 0.7–4.0)
MCH: 29.7 pg (ref 26.0–34.0)
MCHC: 30.4 g/dL (ref 30.0–36.0)
MCV: 97.7 fL (ref 80.0–100.0)
Monocytes Absolute: 0.3 10*3/uL (ref 0.1–1.0)
Monocytes Relative: 5 %
Neutro Abs: 4.8 10*3/uL (ref 1.7–7.7)
Neutrophils Relative %: 82 %
Platelets: 297 10*3/uL (ref 150–400)
RBC: 3.53 MIL/uL — ABNORMAL LOW (ref 4.22–5.81)
RDW: 18.7 % — ABNORMAL HIGH (ref 11.5–15.5)
WBC: 5.8 10*3/uL (ref 4.0–10.5)
nRBC: 0.9 % — ABNORMAL HIGH (ref 0.0–0.2)

## 2019-12-20 LAB — SODIUM
Sodium: 153 mmol/L — ABNORMAL HIGH (ref 135–145)
Sodium: 153 mmol/L — ABNORMAL HIGH (ref 135–145)
Sodium: 153 mmol/L — ABNORMAL HIGH (ref 135–145)
Sodium: 153 mmol/L — ABNORMAL HIGH (ref 135–145)

## 2019-12-20 LAB — GLUCOSE, CAPILLARY
Glucose-Capillary: 90 mg/dL (ref 70–99)
Glucose-Capillary: 123 mg/dL — ABNORMAL HIGH (ref 70–99)
Glucose-Capillary: 113 mg/dL — ABNORMAL HIGH (ref 70–99)
Glucose-Capillary: 122 mg/dL — ABNORMAL HIGH (ref 70–99)
Glucose-Capillary: 123 mg/dL — ABNORMAL HIGH (ref 70–99)
Glucose-Capillary: 108 mg/dL — ABNORMAL HIGH (ref 70–99)

## 2019-12-20 LAB — MAGNESIUM: Magnesium: 2.2 mg/dL (ref 1.7–2.4)

## 2019-12-20 MED ORDER — FREE WATER
400.0000 mL | Status: DC
  Administered 2019-12-20 – 2019-12-21 (×3): 400 mL

## 2019-12-20 MED ORDER — CHLORHEXIDINE GLUCONATE 0.12 % MT SOLN
OROMUCOSAL | Status: AC
  Filled 2019-12-20: qty 15

## 2019-12-20 NOTE — Progress Notes (Signed)
Assumed pt care  From [facility]

## 2019-12-20 NOTE — Progress Notes (Signed)
[name] Kitchen CRITICAL CARE NOTE 59 y.o.malew/chronic neck and back pain whopresented to [facility] 9/2/2021for issues with an incarcerated ventral hernia. In the process of being evaluated the patient was noted to be hypoxic and tested postive for COVID-19.Patient was admitted for management of acute respiratory failure with hypoxia due to COVID-19 pneumonia. Patient transferred to the ICU/stepdown on 27 November 2019 due to increasing FiO2 requirements.  SIGNIFICANT EVENTS/TEST 9/14 progressive resp failure, patient decided to be placed on VENT 9/15 patient emergently intubated, RT CVL placed, LT FEM ART PLACED 9/16severe hypoxia, proned position, PATIENT IS DNR 9/17 severe hypoxia, plan for proning today Wife [name] has been updated daily 9/18 severe ARDS hypoxia, proned positioned, wife updated daily 9/19 remains proned, severe ARDS 9/20 ARDS, resp failure, severe ARDS, PRONED, wife updated daily 9/21 severe ARDS, resp failure, severe hypoxia 9/22 plan for proning today, severe ARDS, severe Hypoxia 9/23 pt unproned tolerating well with O2 sats @96 % 9/24 continue cycling proning/supine position 9/27- FiO2 60 peep 15 patient is DNR wife asking about tracheostomy.  I spoke to wife [name] today to give update, she understands that patient is doing poorly and is at day 12 of mechanical ventilation she wishes to speak to ENT regarding trache.   12/15/19- met with family at bedside, reviewed care plan. Sent consult request for ENT today. Patient with peripheral and scrotal edema during physical examination, will plan to gently diurese today. FiO2 up to 70% today, likely atelectatic changes vs fluid shift related due to several gtt.  9/29- patient on 70% fiO2, adequate urine output overnight. Spoke to wife at bedside, reviewed short term plan and ENT plan for poss trache.  12/17/19- patient remains on 70%.  Both daughters are in room, they had numerous questions and take many photos of things in ICU as well as  patients room, they were rude and very demanding with me and nursing staff today.  They did apologize for being rude but this has now been recurrent.  I've explained to them that we are all working very hard to help patient but its a severe type of COVID and patient with persistent bilateral infiltrates/fibrosis. He has been diuresed and has had no improvement in FiO2. Overall patient is with poor prognosis.  Called and updated wife [name] - no significant changes or improvement.  12/18/19- patient remains critically ill. He is weaned to 60% today on ventilator FiO2. No lower ext edema noted on exam.  +rhonchrous breath sounds.  12/19/19- patient on 90%Fio2 today, remains crtically ill on MV. No appreciable improvement >30days. I met at bedside with Son Lurena Joiner and Wife [name] today to review overall poor prognosis and we discussed that at this point there is essentially no additional medication that we can offer to salvage patients life and prognosis is poor.  12/20/19- patient essentially unchanged from previous, Na elevated will upgrade free water from 300 to 400 q4h via oGT. Palliative to re-evaluate patient on Monday.  Family came in today (both wife and daughter) I was unable to meet with them due to emergency in unit.   REVIEW OF SYSTEMS Unable to assess pt intubated mechanically ventilated  SUBJECTIVE: Unable to assess pt intubated mechanically ventilated  Vent Mode: PRVC FiO2 (%):  [75 %-90 %] 80 % Set Rate:  [30 bmp] 30 bmp Vt Set:  [450 mL] 450 mL PEEP:  [10 cmH20] 10 cmH20 Plateau Pressure:  [26 cmH20-27 cmH20] 27 cmH20   BP (!) 129/52 (BP Location: Left Leg)   Pulse 90  Temp 97.9 F (36.6 C)   Resp (!) 29   Ht 5' 10.98" (1.803 m)   Wt 95 kg   SpO2 92%   BMI 29.22 kg/m    I/O last 3 completed shifts: In: 3604.3 [I.V.:1310.2; NG/GT:2294.1] Out: 2400 [Urine:1650; Stool:750] Total I/O In: 898.1 [I.V.:378.1; NG/GT:520] Out: 600 [Stool:600]  SpO2: 92 % O2 Flow Rate (L/min): 80  L/min FiO2 (%): 80 %  Estimated body mass index is 29.22 kg/m as calculated from the following:   Height as of this encounter: 5' 10.98" (1.803 m).   Weight as of this encounter: 95 kg.  Pressure Injury 12/05/19 Jaw Right Stage 2 -  Partial thickness loss of dermis presenting as a shallow open injury with a red, pink wound bed without slough. (Active)  12/05/19 0500  Location: Jaw  Location Orientation: Right  Staging: Stage 2 -  Partial thickness loss of dermis presenting as a shallow open injury with a red, pink wound bed without slough.  Wound Description (Comments):   Present on Admission: No     Pressure Injury 12/06/19 Face Right Stage 2 -  Partial thickness loss of dermis presenting as a shallow open injury with a red, pink wound bed without slough. (Active)  12/06/19 0730  Location: Face  Location Orientation: Right  Staging: Stage 2 -  Partial thickness loss of dermis presenting as a shallow open injury with a red, pink wound bed without slough.  Wound Description (Comments):   Present on Admission: No     Pressure Injury 12/06/19 Penis Right;Distal Deep Tissue Pressure Injury - Purple or maroon localized area of discolored intact skin or blood-filled blister due to damage of underlying soft tissue from pressure and/or shear. (Active)  12/06/19 1030  Location: Penis  Location Orientation: Right;Distal  Staging: Deep Tissue Pressure Injury - Purple or maroon localized area of discolored intact skin or blood-filled blister due to damage of underlying soft tissue from pressure and/or shear.  Wound Description (Comments):   Present on Admission:      Pressure Injury 12/06/19 Ear Right Stage 2 -  Partial thickness loss of dermis presenting as a shallow open injury with a red, pink wound bed without slough. (Active)  12/06/19 0800  Location: Ear  Location Orientation: Right  Staging: Stage 2 -  Partial thickness loss of dermis presenting as a shallow open injury with a red,  pink wound bed without slough.  Wound Description (Comments):   Present on Admission: No     Pressure Injury 12/09/19 Face Left Stage 2 -  Partial thickness loss of dermis presenting as a shallow open injury with a red, pink wound bed without slough. skin tear under ETT holder adhesive device (Active)  12/09/19 1200  Location: Face  Location Orientation: Left  Staging: Stage 2 -  Partial thickness loss of dermis presenting as a shallow open injury with a red, pink wound bed without slough.  Wound Description (Comments): skin tear under ETT holder adhesive device  Present on Admission: No   PHYSICAL EXAMINATION:  GENERAL: Acutely ill appearing male, synchronous, mechanically ventilated HEAD: Normocephalic, atraumatic.  EYES: Pupils equal, round, sluggish to light.  No scleral icterus.  MOUTH: Orotracheally intubated, OG in place. NECK: Supple. Trachea midline. No JVD. Numerous areas of skin breakdown around neck and face from proning protocol PULMONARY: diminished breath sounds throughout, even, non labored, subcutaneous emphysema present bilateral upper chest, rhonchi bialteraly CARDIOVASCULAR:Regular rate and rhythm noted on monitor.  ABDOMEN: Soft, nondistended, hypoactive bowel sounds, rectal tube  in place with watery brown stools, +scrotal edema MUSCULOSKELETAL: resolution of LE edema NEUROLOGIC: sedated, not following commands, PERRL GCS4T SKIN: facial pressure injuries, subcu emphysema, not increasing.   Scheduled Meds: . vitamin C  1,000 mg Per Tube Daily  . chlorhexidine gluconate (MEDLINE KIT)  15 mL Mouth Rinse BID  . Chlorhexidine Gluconate Cloth  6 each Topical Daily  . docusate  100 mg Per Tube BID  . enoxaparin (LOVENOX) injection  40 mg Subcutaneous Q24H  . famotidine  20 mg Per Tube BID  . feeding supplement (PROSource TF)  90 mL Per Tube BID  . free water  400 mL Per Tube Q4H  . insulin aspart  0-15 Units Subcutaneous Q4H  . mouth rinse  15 mL Mouth Rinse 10  times per day  . polyethylene glycol  17 g Per Tube Daily  . sodium chloride flush  10-40 mL Intracatheter Q12H  . zinc sulfate  220 mg Per Tube Daily   Continuous Infusions: . dextrose Stopped (12/18/19 1750)  . feeding supplement (VITAL 1.5 CAL) 1,000 mL (12/20/19 0300)  . fentaNYL infusion INTRAVENOUS 300 mcg/hr (12/20/19 1500)  . propofol (DIPRIVAN) infusion Stopped (12/16/19 0720)   PRN Meds:.acetaminophen, fentaNYL, ondansetron **OR** ondansetron (ZOFRAN) IV, sodium chloride flush      Indwelling Urinary Catheter continued, requirement due to   Reason to continue Indwelling Urinary Catheter strict Intake/Output monitoring for hemodynamic instability   Central Line/ DC central line, PICC line 9/24  Reason to continue [employer] continued, requirement due to severe respiratory failure   Ventilator Sedation RASS -2 to -3       12/17/27 - compared to 2 wks ago there appears to be very little change in chest x ray with ARDS/pneumonitis/fibrosis.    ASSESSMENT AND PLAN SYNOPSIS  Acute hypoxemic respiratory failure due to COVID-19 pneumonia / ARDS Mechanical ventilation via ARDS protocol, target mode per RT 6 cc/kg Wean PEEP and FiO2 as able Goal plateau pressure less than 30, driving pressure less than 15 Paralytics if necessary for vent synchrony, gas exchange Cycle prone positioning if necessary for oxygenation PAD protocol, goal RASS -2 to -3, currently requiring fentanyl and propofol gtts  Diuresis as blood pressure and renal function can tolerate VAP prevention order set IV STEROIDS dcd now that patient is 1 month out of acute illness Vitamin C, zinc   Acute toxic metabolic encephalopathy Ventilator discomfort sedation Goal RASS -1 Fentanyl/propofol  SHOCK Shock physiology has corrected  Thrombocytopenia -resolved Due to severe critical illness Bone marrow suppression from viral illness Currently on SCDs for DVT prophylaxis  ID Monitor  fever/WBCs Trend procalcitonin as indicated  Prophylaxis DVT: SCDs GI: Famotidine   Severe protein calorie malnutrition Hypoalbuminemia Supplemental albumin Continue tube feeds   ENDO ICU hyperglycemia protocol   Multidisciplinary rounds were performed with the ICU team.    Critical care provider statement:    Critical care time (minutes):  33   Critical care time was exclusive of:  Separately billable procedures and  treating other patients   Critical care was necessary to treat or prevent imminent or  life-threatening deterioration of the following conditions:  acute hypoxemic respiratory failure.    Critical care was time spent personally by me on the following  activities:  Development of treatment plan with patient or surrogate,  discussions with consultants, evaluation of patient's response to  treatment, examination of patient, obtaining history from patient or  surrogate, ordering and performing treatments and interventions, ordering  and  review of laboratory studies and re-evaluation of patient's condition   I assumed direction of critical care for this patient from another  provider in my specialty: no     Ottie Glazier, M.D.  Pulmonary & [place]

## 2019-12-21 ENCOUNTER — Inpatient Hospital Stay: Payer: HRSA Program

## 2019-12-21 ENCOUNTER — Encounter: Payer: Self-pay | Admitting: Anesthesiology

## 2019-12-21 LAB — CBC WITH DIFFERENTIAL/PLATELET
Abs Immature Granulocytes: 0.86 10*3/uL — ABNORMAL HIGH (ref 0.00–0.07)
Basophils Absolute: 0 10*3/uL (ref 0.0–0.1)
Basophils Relative: 0 %
Eosinophils Absolute: 0.2 10*3/uL (ref 0.0–0.5)
Eosinophils Relative: 2 %
HCT: 22.6 % — ABNORMAL LOW (ref 39.0–52.0)
Hemoglobin: 6.7 g/dL — ABNORMAL LOW (ref 13.0–17.0)
Immature Granulocytes: 8 %
Lymphocytes Relative: 15 %
Lymphs Abs: 1.7 10*3/uL (ref 0.7–4.0)
MCH: 29.9 pg (ref 26.0–34.0)
MCHC: 29.6 g/dL — ABNORMAL LOW (ref 30.0–36.0)
MCV: 100.9 fL — ABNORMAL HIGH (ref 80.0–100.0)
Monocytes Absolute: 0.8 10*3/uL (ref 0.1–1.0)
Monocytes Relative: 8 %
Neutro Abs: 7.5 10*3/uL (ref 1.7–7.7)
Neutrophils Relative %: 67 %
Platelets: 326 10*3/uL (ref 150–400)
RBC: 2.24 MIL/uL — ABNORMAL LOW (ref 4.22–5.81)
RDW: 18.4 % — ABNORMAL HIGH (ref 11.5–15.5)
Smear Review: NORMAL
WBC: 11 10*3/uL — ABNORMAL HIGH (ref 4.0–10.5)
nRBC: 5.5 % — ABNORMAL HIGH (ref 0.0–0.2)

## 2019-12-21 LAB — GLUCOSE, CAPILLARY
Glucose-Capillary: 149 mg/dL — ABNORMAL HIGH (ref 70–99)
Glucose-Capillary: 162 mg/dL — ABNORMAL HIGH (ref 70–99)

## 2019-12-21 LAB — RENAL FUNCTION PANEL
Albumin: 1.7 g/dL — ABNORMAL LOW (ref 3.5–5.0)
Anion gap: 4 — ABNORMAL LOW (ref 5–15)
BUN: 55 mg/dL — ABNORMAL HIGH (ref 8–23)
CO2: 47 mmol/L — ABNORMAL HIGH (ref 22–32)
Calcium: 8.3 mg/dL — ABNORMAL LOW (ref 8.9–10.3)
Chloride: 101 mmol/L (ref 98–111)
Creatinine, Ser: 0.8 mg/dL (ref 0.61–1.24)
GFR calc Af Amer: >60 mL/min (ref >60)
GFR calc non Af Amer: >60 mL/min (ref >60)
Glucose, Bld: 181 mg/dL — ABNORMAL HIGH (ref 70–99)
Phosphorus: 3.2 mg/dL (ref 2.5–4.6)
Potassium: 5 mmol/L (ref 3.5–5.1)
Sodium: 152 mmol/L — ABNORMAL HIGH (ref 135–145)

## 2019-12-21 LAB — SODIUM: Sodium: 152 mmol/L — ABNORMAL HIGH (ref 135–145)

## 2019-12-21 LAB — MAGNESIUM: Magnesium: 2.1 mg/dL (ref 1.7–2.4)

## 2019-12-21 MED ORDER — NOREPINEPHRINE 4 MG/250ML-% IV SOLN
0.0000 ug/min | INTRAVENOUS | Status: DC
  Administered 2019-12-21: 40 ug/min via INTRAVENOUS
  Filled 2019-12-21 (×2): qty 250

## 2019-12-21 MED ORDER — MORPHINE 100MG IN NS 100ML (1MG/ML) PREMIX INFUSION
5.0000 mg/h | INTRAVENOUS | Status: DC
  Administered 2019-12-21: 5 mg/h via INTRAVENOUS
  Filled 2019-12-21: qty 100

## 2019-12-21 MED ORDER — NOREPINEPHRINE 4 MG/250ML-% IV SOLN
INTRAVENOUS | Status: AC
  Administered 2019-12-21: 10 ug/min via INTRAVENOUS
  Filled 2019-12-21: qty 250

## 2019-12-21 MED ORDER — VASOPRESSIN 20 UNITS/100 ML INFUSION FOR SHOCK
0.0000 [IU]/min | INTRAVENOUS | Status: DC
  Administered 2019-12-21: 0.03 [IU]/min via INTRAVENOUS
  Filled 2019-12-21: qty 100

## 2019-12-22 SURGERY — CREATION, TRACHEOSTOMY
Anesthesia: General

## 2020-01-18 NOTE — Progress Notes (Signed)
Pt made comfort care today around [phone] with wife and daughter at bedside. Pt was started on morphine gtt at 1017 am. 1054 noted PEA while with pt family in the room, which become asystole, pt was still the vent when he went asystole, RN was also called by cardiac telemetry for the above rhythm . Patient is DNR/DNIon comfort care.  Patient seen and examined at 1054 on 01/13/2020, pt was unresponsive to painful stimulation. Heart and lung sound are absent, no spontaneous cardiac or respiratory  Activity. Patient is not responding/nonreactive to verbal or painful stimuli. No corneal pupillary reflex present. Pupils fixed and dilated. Rick Lowery was Pronounced dead at 1054 .  Patient's family and patient's nurse were present in the patient room.  Spoke with family in room. Family dose not want the patient to have autopsy chaplain has been contacted to speak with they family. Condolences were provided to the patiens' family".

## 2020-01-18 NOTE — Progress Notes (Signed)
CH encountered pt.'s wife, dtr. and son leaving the hospital; aware of pt.'s decline this AM and death this afternoon from ICU staff, CH offered condolences to family.  Family grateful: 'we're OK,' wife said.

## 2020-01-18 NOTE — Death Summary Note (Signed)
DEATH SUMMARY   Patient Details  Name: Rick Lowery MRN: [id number] DOB: Dec 04, 1960  Admission/Discharge Information   Admit Date:  12-14-2019  Date of Death:  2020-01-15  Time of Death:  10:54 HRS  Length of Stay: 32  Referring Physician: Patient, No Pcp Per   Reason(s) for Hospitalization  Acute respiratory failure with hypoxia due to to COVID-19 virus pneumonia.  Diagnoses  Preliminary cause of death:  Secondary Diagnoses (including complications and co-morbidities):  Principal Problem:   Pneumonia due to COVID-19 virus Active Problems:   Acute respiratory failure due to COVID-19 [name])   Incarcerated ventral hernia   Transaminitis   Hyponatremia   Elevated LFTs   Insomnia   Impaired fasting glucose   Weakness   Acute respiratory failure (HCC)   Pressure injury of skin   Brief Hospital Course (including significant findings, care, treatment, and services provided and events leading to death)  Rick Lowery is a 59 y.o. year old male with medical history significant for chronic neck and back pain who presents to the emergency room for evaluation of abdominal pain mostly around the periumbilical area.  He rated his abdominal pain a 10 x 10 in intensity at its worst.  It was sudden in onset and associated with nausea but he denied having any vomiting or any changes in his bowel habits.  There was no radiation of the pain.  He denies having any urinary symptoms, no chest pain, no shortness of breath, no fever, no dizziness, no lightheadedness. While in the ER patient was noted to be hypoxic and had room air pulse oximetry of 82% and is currently on a nonrebreather mask with pulse oximetry in the low 90s.    The patient was unvaccinated and denied having any sick contacts. Vital signs on admission showed: T-max 100.31F, pulse 85, respiratory rate 18, blood pressure 140/66. Labs revealed sodium 126, potassium 3.7, chloride 93, bicarb 22, glucose 138, BUN 10, creatinine  0.90, lactic acid 1.5, white count 5.4, hemoglobin 14.8, hematocrit 41, platelet count 229. His SARS coronavirus 2 PCR test was positive. Chest x-ray reviewed by me shows bilateral opacities in both lung fields.  The patient was admitted for management of acute respiratory failure with hypoxia in the setting of COVID-19 pneumonia.  His incarcerated umbilical hernia was reduced in the emergency room without further issue in this regard.  The patient was initially admitted to the general medical COVID-19 ward.  On 27 November 2019 he was noted to have increasing FiO2 requirements and was transferred to ICU/stepdown.  He began protocol with self proning.  The patient stated that he was DNR.  The patient initially refused mechanical ventilation.  On 14 September he agreed to be placed on the vent late at night and was intubated in the early morning hours of 15 September.  Remainder of the hospital stay as below:  9/14 progressive resp failure, patient decided to be placed on VENT 9/15 patient emergently intubated, RT CVL placed, LT FEM ART PLACED 9/16severe hypoxia, proned position, PATIENT IS DNR 9/17 severe hypoxia, plan for proning today Wife [name] has been updated daily 9/18 severe ARDS hypoxia, proned positioned, wife updated daily 9/19 remains proned, severe ARDS 9/20 ARDS, resp failure, severe ARDS, PRONED, wife updated daily 9/21 severe ARDS, resp failure, severe hypoxia 9/22 plan for proning today, severe ARDS, severe Hypoxia 9/23 pt unproned tolerating well with O2 sats @96 % 9/24 continue cycling proning/supine position 9/27- FiO2 60 peep 15 patient is DNR wife asking about  tracheostomy.  I spoke to wife [name] today to give update, she understands that patient is doing poorly and is at day 12 of mechanical ventilation she wishes to speak to ENT regarding trache.   12/15/19- met with family at bedside, reviewed care plan. Sent consult request for ENT today. Patient with peripheral and scrotal  edema during physical examination, will plan to gently diurese today. FiO2 up to 70% today, likely atelectatic changes vs fluid shift related due to several gtt.  9/29- patient on 70% fiO2, adequate urine output overnight. Spoke to wife at bedside, reviewed short term plan and ENT plan for poss trache.  12/17/19- patient remains on 70%.  Both daughters are in room, they had numerous questions and take many photos of things in ICU as well as patients room, they were rude and very demanding with me and nursing staff today.  They did apologize for being rude but this has now been recurrent.  I've explained to them that we are all working very hard to help patient but its a severe type of COVID and patient with persistent bilateral infiltrates/fibrosis. He has been diuresed and has had no improvement in FiO2. Overall patient is with poor prognosis.  Called and updated wife [name] - no significant changes or improvement.  12/18/19- patient remains critically ill. He is weaned to 60% today on ventilator FiO2. No lower ext edema noted on exam.  +rhonchrous breath sounds.  12/19/19- patient on 90%Fio2 today, remains crtically ill on MV. No appreciable improvement >30days. I met at bedside with Son Lurena Joiner and Wife [name] today to review overall poor prognosis and we discussed that at this point there is essentially no additional medication that we can offer to salvage patients life and prognosis is poor.  12/20/19- patient essentially unchanged from previous, Na elevated will upgrade free water from 300 to 400 q4h via oGT. Palliative to re-evaluate patient on Monday.  Family came in today (both wife and daughter) I was unable to meet with them due to emergency in unit.  06-Jan-2020-developed hypotension, pressor dependent, in dying process.  Family at bedside expecting other family members.  Liberalizing visitation.    The patient did develop some issues with pressure ulcers during his proning protocol while intubated.  These  healed after proning protocol was discontinued.  He continued to do poorly and never really regained good respiratory function.  He had significant post ARDS fibrosis.  On the morning of 4 October he became hypotensive and was refractory to vasopressors.  Family gathered and decided to initiate comfort measures.  They were contemplating whether to remove the endotracheal tube but the patient expired prior to this decision be made.  Family was in attendance.  The patient expired on 01/06/20 at 1054 hrs.  Pertinent Labs and Studies  Significant Diagnostic Studies DG Chest 1 View  Result Date: 12/17/2019 CLINICAL DATA:  Respiratory failure EXAM: CHEST  1 VIEW COMPARISON:  Radiograph 12/15/2019 FINDINGS: Endotracheal tube terminates in the mid trachea 3.7 cm from the carina. Transesophageal tube tip and side port distal to the GE junction. Telemetry leads overlie the chest. Additional support devices external to the patient. Diffuse heterogeneous bilateral airspace opacities are similar to prior accounting for for differences in technique. No pneumothorax or visible effusion. Subcutaneous emphysema is diminished from prior. No acute osseous abnormality. IMPRESSION: 1. Stable diffuse heterogeneous bilateral airspace opacities. 2. Diminishing subcutaneous emphysema. 3. Satisfactory positioning of the transesophageal and endotracheal tubes. 4.  Aortic Atherosclerosis (ICD10-I70.0). Electronically Signed  By: Lovena Le M.D.   On: 12/17/2019 00:37   DG Abd 1 View  Result Date: 12/17/2019 CLINICAL DATA:  Orogastric tube placement EXAM: ABDOMEN - 1 VIEW COMPARISON:  12/16/2019 FINDINGS: Tip and side port of the orogastric tube project within the stomach. Bowel gas pattern is normal. IMPRESSION: Orogastric tube tip and side port in the stomach. Electronically Signed   By: Ulyses Jarred M.D.   On: 12/17/2019 00:40   DG Abd 1 View  Result Date: 12/16/2019 CLINICAL DATA:  Emesis EXAM: ABDOMEN - 1 VIEW  COMPARISON:  12/06/2019 FINDINGS: Esophageal tube has been removed. Surgical clips in the right upper quadrant. Nonobstructed gas pattern. IMPRESSION: Nonobstructed gas pattern. Previously identified esophageal tube is not visible on the current image. Electronically Signed   By: Donavan Foil M.D.   On: 12/16/2019 22:36   DG Abd 1 View  Addendum Date: 12/06/2019   ADDENDUM REPORT: 12/06/2019 17:12 ADDENDUM: Results were discussed with Dr. Mortimer Fries at 4:13 p.m. [place] on December 06, 2019. Electronically Signed   By: Virgina Norfolk M.D.   On: 12/06/2019 17:12   Result Date: 12/06/2019 CLINICAL DATA:  Orogastric tube placement. EXAM: ABDOMEN - 1 VIEW COMPARISON:  December 04, 2019 and December 02, 2019. FINDINGS: Marked severity infiltrates are again seen throughout the visualized portion of the bilateral lung bases with marked severity pneumomediastinum. A nasogastric tube is noted with its distal tip overlying the body of the stomach. This is approximately 5.7 cm distal to the expected region of the gastroesophageal junction. The bowel gas pattern is normal. A thin (approximately 2.5 mm) area of linear lucency is seen overlying the medial aspect of the left lung base and left abdomen. This is adjacent to the lateral aspect of the descending thoracic aorta and abdominal aorta. A much smaller, thin lucent area is seen within this region on the prior study. A more prominent area of lucency is seen along the medial aspect of the left upper quadrant and mid left abdomen. Radiopaque surgical clips are seen overlying the right upper quadrant. No radio-opaque calculi or other significant radiographic abnormality are seen. IMPRESSION: 1. Moderate to marked severity pneumomediastinum within the visualized portion of the lungs, with additional findings consistent with extension of air into the mid and upper abdomen. Correlation with abdomen pelvis CT is recommended. 2. Nasogastric tube positioning, as described  above. Further advancement of the NG tube by approximately 6 cm is recommended to decrease the risk of aspiration. Electronically Signed: By: Virgina Norfolk M.D. On: 12/06/2019 16:08   DG Abd 1 View  Result Date: 12/04/2019 CLINICAL DATA:  Orogastric tube placement. EXAM: ABDOMEN - 1 VIEW COMPARISON:  None. FINDINGS: Marked severity infiltrates are seen throughout the visualized portion of the lung bases. A nasogastric tube is seen with its distal tip overlying the fundus of the stomach. The bowel gas pattern is normal. Radiopaque surgical clips are seen overlying the right upper quadrant. No radio-opaque calculi or other significant radiographic abnormality are seen. IMPRESSION: Nasogastric tube positioning, as described above. Electronically Signed   By: Virgina Norfolk M.D.   On: 12/04/2019 17:48   DG Abd 1 View  Result Date: 12/02/2019 CLINICAL DATA:  Orogastric tube repositioning EXAM: ABDOMEN - 1 VIEW COMPARISON:  12/02/2019 at 12:50 a.m. FINDINGS: Orogastric tube tip is in the stomach, but the side port is slightly proximal to the gastroesophageal junction. Recommend advancing by 7 cm. IMPRESSION: Orogastric tube tip in the stomach, but side port slightly proximal to the gastroesophageal  junction. Recommend advancing by 7 cm. Electronically Signed   By: Ulyses Jarred M.D.   On: 12/02/2019 01:08   DG Abd 1 View  Result Date: 12/02/2019 CLINICAL DATA:  Orogastric tube placement EXAM: ABDOMEN - 1 VIEW COMPARISON:  None. FINDINGS: No orogastric tube is visible. A follow-up radiograph has been obtained at the time of this dictation. IMPRESSION: No orogastric tube visible. Electronically Signed   By: Ulyses Jarred M.D.   On: 12/02/2019 01:07   CT ANGIO CHEST PE W OR WO CONTRAST  Result Date: 11/22/2019 CLINICAL DATA:  Increasing shortness of breath in a patient with COVID-19 infection. EXAM: CT ANGIOGRAPHY CHEST WITH CONTRAST TECHNIQUE: Multidetector CT imaging of the chest was performed using  the standard protocol during bolus administration of intravenous contrast. Multiplanar CT image reconstructions and MIPs were obtained to evaluate the vascular anatomy. CONTRAST:  160m OMNIPAQUE IOHEXOL 350 MG/ML SOLN COMPARISON:  Chest radiograph November 19, 2019 FINDINGS: Cardiovascular: Satisfactory opacification of the pulmonary arteries to the segmental level. No evidence of pulmonary embolism. Mildly enlarged heart. No pericardial effusion. Mediastinum/Nodes: No enlarged mediastinal, hilar, or axillary lymph nodes. Thyroid gland, trachea, and esophagus demonstrate no significant findings. Lungs/Pleura: Widespread ground-glass and confluent airspace consolidation throughout both lungs with lower lobe and dependent distribution, consistent with moderate to severe COVID-19 pneumonia. 7 mm solid soft tissue nodule in the right middle lobe, image 177/323. Upper Abdomen: No acute abnormality. Musculoskeletal: No chest wall abnormality. No acute or significant osseous findings. Review of the MIP images confirms the above findings. IMPRESSION: 1. No evidence of pulmonary embolus. 2. Widespread ground-glass and confluent airspace consolidation throughout both lungs with lower lobe and dependent distribution, consistent with moderate to severe COVID-19 pneumonia. 3. 7 mm solid soft tissue nodule in the right middle lobe. Non-contrast chest CT at 6-12 months is recommended. If the nodule is stable at time of repeat CT, then future CT at 18-24 months (from today's scan) is considered optional for low-risk patients, but is recommended for high-risk patients. This recommendation follows the consensus statement: Guidelines for Management of Incidental Pulmonary Nodules Detected on CT Images: From the Fleischner Society 2017; Radiology 2017; 284:228-243. 4. Mildly enlarged heart. Electronically Signed   By: DFidela SalisburyM.D.   On: 11/22/2019 12:41   [place]Venous Img Lower Bilateral (DVT)  Result Date:  11/24/2019 CLINICAL DATA:  Shortness of breath. EXAM: BILATERAL LOWER EXTREMITY VENOUS DOPPLER ULTRASOUND TECHNIQUE: Gray-scale sonography with compression, as well as color and duplex ultrasound, were performed to evaluate the deep venous system(s) from the level of the common femoral vein through the popliteal and proximal calf veins. COMPARISON:  None. FINDINGS: VENOUS Normal compressibility of the common femoral, superficial femoral, and popliteal veins, as well as the visualized calf veins. Visualized portions of profunda femoral vein and great saphenous vein unremarkable. No filling defects to suggest DVT on grayscale or color Doppler imaging. Doppler waveforms show normal direction of venous flow, normal respiratory plasticity and response to augmentation. OTHER None. Limitations: none IMPRESSION: Negative. Electronically Signed   By: CConstance HolsterM.D.   On: 11/24/2019 15:35   DG Chest Port 1 View  Result Date: 12021/10/19CLINICAL DATA:  Acute respiratory failure.  COVID-19 positive. EXAM: PORTABLE CHEST 1 VIEW COMPARISON:  12/17/2019 and CT chest 12/12/2019. FINDINGS: Endotracheal tube terminates 4.1 cm above the carina. Heart is enlarged, stable. Thoracic aorta is calcified. Nasogastric tube is followed into the stomach. Diffuse bilateral airspace opacification appears basilar predominant and minimally worsened from 12/17/2019. Probable small  bilateral pleural effusions. No pneumothorax. IMPRESSION: 1. Slight worsening in COVID-19 pneumonia. 2. Small bilateral pleural effusions. Electronically Signed   By: Lorin Picket M.D.   On: 29-Dec-2019 07:49   DG Chest Port 1 View  Result Date: 12/15/2019 CLINICAL DATA:  Acute respiratory failure EXAM: PORTABLE CHEST 1 VIEW COMPARISON:  12/12/2019 FINDINGS: Endotracheal tube may have been removed. Enteric tube and right PICC line are unchanged in position. Cardiac enlargement. Bilateral pulmonary infiltrates. Subcutaneous emphysema bilaterally. Similar  appearance to previous study. IMPRESSION: Endotracheal tube may have been removed. Persistent bilateral pulmonary infiltrates and subcutaneous emphysema. Electronically Signed   By: Lucienne Capers M.D.   On: 12/15/2019 04:56   DG Chest Port 1 View  Result Date: 12/12/2019 CLINICAL DATA:  Respiratory failure EXAM: PORTABLE CHEST 1 VIEW COMPARISON:  12/11/2019 and prior radiographs FINDINGS: Endotracheal tube is noted with tip 6 cm above the carina. NG tube enters the stomach. Diffuse bilateral airspace opacities and diffuse subcutaneous emphysema are again noted. There is no evidence of pneumothorax. IMPRESSION: Unchanged appearance of the chest with diffuse bilateral airspace opacities and subcutaneous emphysema. Electronically Signed   By: Margarette Canada M.D.   On: 12/12/2019 07:27   DG Chest Port 1 View  Result Date: 12/11/2019 CLINICAL DATA:  Respiratory failure. EXAM: PORTABLE CHEST 1 VIEW COMPARISON:  12/09/2019. FINDINGS: Endotracheal tube, NG tube, right IJ line in stable position. Heart size stable. Diffuse severe bilateral pulmonary infiltrates are again noted. No pleural effusion or pneumothorax. Extensive diffuse chest wall subcutaneous emphysema is again noted without interim change. IMPRESSION: 1.  Lines and tubes stable position. 2. Diffuse severe bilateral pulmonary infiltrates again noted without interim change. 3. Diffuse severe bilateral chest wall subcutaneous emphysema again noted without interim change. No pneumothorax noted. Electronically Signed   By: Marcello Moores  Register   On: 12/11/2019 07:53   DG Chest Port 1 View  Result Date: 12/09/2019 CLINICAL DATA:  Pneumonia due to COVID-19. EXAM: PORTABLE CHEST 1 VIEW COMPARISON:  December 07, 2019. FINDINGS: The heart size and mediastinal contours are within normal limits. Stable position of endotracheal nasogastric tubes. Right internal jugular catheter is unchanged. Stable extensive subcutaneous emphysema is noted bilaterally and in the  supraclavicular regions. No pneumothorax is noted. Bilateral lung opacities are noted which may represent pneumonia. Small left pleural effusion is noted. The visualized skeletal structures are unremarkable. IMPRESSION: Stable support apparatus. Stable extensive subcutaneous emphysema is noted bilaterally. Bilateral lung opacities are noted which may represent pneumonia. Small left pleural effusion is noted. Electronically Signed   By: Marijo Conception M.D.   On: 12/09/2019 11:32   DG Chest Port 1 View  Result Date: 12/07/2019 CLINICAL DATA:  59 year old male with a history of COVID, respiratory failure EXAM: PORTABLE CHEST 1 VIEW COMPARISON:  Multiple prior most recent 12/06/2019 FINDINGS: Cardiomediastinal silhouette unchanged. Unchanged endotracheal tube terminating at the level of the clavicular heads. Unchanged right IJ central venous catheter. Unchanged gastric tube which terminates in the left upper quadrant. Extensive subcutaneous/myofacial gas of the bilateral chest wall. Lucencies at the interfaces the mediastinum, compatible with persisting mediastinal gas. Linear gas in the right upper quadrant, appears to be under right hemidiaphragm. This is new from the comparison. Similar appearance of the lungs with reticulonodular opacities. No pneumothorax. No pleural effusion. IMPRESSION: New linear gas projecting under the right hemidiaphragm, concerning for free abdominal/peritoneal air. These results were discussed by telephone at the time of interpretation on 12/07/2019 at 8:24 am with the nurse caring for the patient, Ms  Rachael. Redemonstration mediastinal gas, bilateral chest wall myofacial/subcutaneous emphysema No pneumothorax. Similar appearance of the bilateral lungs with bilateral mixed interstitial and airspace disease compatible with combination of acute and chronic changes Unchanged endotracheal tube, right IJ central venous catheter, and gastric tube Electronically Signed   By: Corrie Mckusick  D.O.   On: 12/07/2019 08:24   DG Chest Port 1 View  Result Date: 12/06/2019 CLINICAL DATA:  ETT placement EXAM: PORTABLE CHEST 1 VIEW COMPARISON:  12/06/2019, 12/05/2018 FINDINGS: Endotracheal tube with the tip 10 cm above the carina. Nasogastric tube coursing below the diaphragm. Right jugular central venous catheter with the tip projecting over the SVC. Diffuse bilateral patchy interstitial and alveolar airspace opacities. No pleural effusion or pneumothorax. Pneumomediastinum. Stable heart size. Extensive soft tissue subcutaneous emphysema along the right and left chest walls extending into the neck. No acute osseous abnormality. IMPRESSION: 1. Endotracheal tube with the tip 10 cm above the carina. 2. Bilateral interstitial and alveolar airspace opacities as can be seen with multilobar pneumonia including atypical viral pneumonia. Pneumomediastinum again noted. 3. Extensive soft tissue subcutaneous emphysema along the right and left chest walls extending into the neck. Electronically Signed   By: Kathreen Devoid   On: 12/06/2019 15:52   DG Chest Port 1 View  Result Date: 12/06/2019 CLINICAL DATA:  Endotracheal tube. EXAM: PORTABLE CHEST 1 VIEW COMPARISON:  12/06/2019 at 2:57 p.m. FINDINGS: Endotracheal tube has tip 5.5 cm above the carina. Enteric tube has tip over the stomach in the left upper quadrant. Right IJ central venous catheter unchanged. Lungs are adequately inflated with moderate stable bilateral airspace opacification which may be due to multifocal infection versus edema. No significant effusion. Continued evidence of pneumomediastinum. Continued moderate diffuse subcutaneous emphysema throughout the thorax. Remainder the exam is unchanged. IMPRESSION: 1. Stable moderate bilateral airspace process which may be due to multifocal infection versus edema. 2. Stable moderate diffuse subcutaneous emphysema throughout the thorax. Stable pneumomediastinum. 3. Tubes and lines as described. Electronically  Signed   By: Marin Olp M.D.   On: 12/06/2019 15:37   DG Chest Port 1 View  Result Date: 12/06/2019 CLINICAL DATA:  Respiratory failure. EXAM: PORTABLE CHEST 1 VIEW COMPARISON:  12/05/2019 FINDINGS: Right IJ catheter tip is at the mid SVC. ETT tip is above the carina. Nasogastric tube tip is below the GE junction. The NG tube side port is at or just above the GE junction. Stable cardiomediastinal contours. Mild pneumomediastinum is identified. There is a thin lucency along the lateral right hemidiaphragm which is concerning for small pneumothorax. Diffuse interstitial and airspace opacities are unchanged. IMPRESSION: 1. Suspect tiny right lateral base pneumothorax. Attention to this area on follow-up imaging is advised. 2. Stable mild pneumomediastinum. 3. No change in aeration to the lungs compared with previous exam. 4. The side port of the nasogastric tube is at or just above the GE junction. Consider advancing the NG tube such that the side port is situated well below the GE junction. 5. These results will be called to the ordering clinician or representative by the Radiologist Assistant, and communication documented in the PACS or [employer]. Electronically Signed   By: Kerby Moors M.D.   On: 12/06/2019 08:32   DG Chest Port 1 View  Result Date: 12/05/2019 CLINICAL DATA:  Acute respiratory failure with hypoxia. EXAM: PORTABLE CHEST 1 VIEW COMPARISON:  12/03/2018. FINDINGS: ETT tip is stable above the carina. Right IJ catheter tip projects over the mid SVC. There is a nasogastric tube with  tip below the level of the GE junction. Unchanged cardiomediastinal contours. Diffuse interstitial and airspace densities throughout both lungs appears unchanged. Pneumo mediastinum is less conspicuous on today's study. Persistent bilateral supraclavicular and left axillary subcutaneous gas. IMPRESSION: 1. Stable support apparatus. 2. No change in aeration to the lungs compared with previous exam. 3. Pneumo  mediastinum is less conspicuous on today's study. Electronically Signed   By: Kerby Moors M.D.   On: 12/05/2019 05:45   DG Chest Port 1 View  Result Date: 12/03/2019 CLINICAL DATA:  COVID pneumonia. EXAM: PORTABLE CHEST 1 VIEW COMPARISON:  12/02/2019 FINDINGS: The endotracheal tube, NG tube and right IJ catheters are stable. New fairly extensive pneumomediastinum without definite pneumothorax. There is also significant new subcutaneous emphysema involving the right chest and right neck. The cardiac silhouette, mediastinal and hilar contours are stable. Persistent diffuse interstitial and airspace process in the lungs consistent with COVID pneumonia. No pleural effusion. IMPRESSION: 1. Stable support apparatus. 2. New fairly extensive pneumomediastinum and new subcutaneous emphysema involving the right chest and right neck. No definite pneumothorax but suspect occult pneumothorax. Chest CT may be helpful for further evaluation if clinically indicated. These results will be called to the ordering clinician or representative by the Radiologist Assistant, and communication documented in the PACS or [employer]. Electronically Signed   By: Marijo Sanes M.D.   On: 12/03/2019 07:48   DG Chest Port 1 View  Result Date: 12/02/2019 CLINICAL DATA:  Endotracheal tube position EXAM: PORTABLE CHEST 1 VIEW COMPARISON:  None. FINDINGS: Support Apparatus: --Endotracheal tube: Tip at the level of the clavicular heads. --Enteric tube:Orogastric tube tip is also near the level of the clavicular heads. At the time of this dictation, a follow-up radiograph has been obtained. --Catheter(s):Right IJ approach central venous catheter tip is in the lower SVC. --Other: None There are bilateral, left-greater-than-right airspace opacities. Cardiomediastinal contours are normal. No sizable pleural effusion. IMPRESSION: 1. Endotracheal tube tip at the level of the clavicular heads. 2. Orogastric tube tip near the level of the  clavicular heads. Follow-up radiograph has been obtained at the time of this dictation. Electronically Signed   By: Ulyses Jarred M.D.   On: 12/02/2019 01:06   DG Chest Port 1 View  Result Date: 12/01/2019 CLINICAL DATA:  Acute respiratory failure. EXAM: PORTABLE CHEST 1 VIEW COMPARISON:  Chest x-ray 11/30/2019 FINDINGS: Stable cardiac enlargement and prominent mediastinal and hilar contours. Persistent diffuse pulmonary infiltrates with dense airspace consolidation in the left lower lobe. Probable small left effusion. IMPRESSION: Persistent diffuse pulmonary infiltrates left greater than right. Electronically Signed   By: Marijo Sanes M.D.   On: 12/01/2019 06:11   DG Chest Port 1 View  Result Date: 11/30/2019 CLINICAL DATA:  Acute respiratory failure, COVID EXAM: PORTABLE CHEST 1 VIEW COMPARISON:  11/27/2019 FINDINGS: Multifocal patchy opacities throughout the left lung. Additional mild patchy opacity inferiorly in the right upper lobe and at the right lung base. This appearance is mildly progressive. No pleural effusion or pneumothorax. The heart is normal in size. IMPRESSION: Multifocal pneumonia in this patient with known COVID, mildly progressive. Electronically Signed   By: Julian Hy M.D.   On: 11/30/2019 08:25   DG Chest Port 1 View  Result Date: 11/27/2019 CLINICAL DATA:  Hypoxia.  COVID-19 positive EXAM: PORTABLE CHEST 1 VIEW COMPARISON:  November 25, 2019 FINDINGS: Foci of airspace opacity scattered throughout the left lung as well as in the medial right base persist. New slight increased opacity in the right mid  lung. Heart size and pulmonary vascular normal. No adenopathy. There is aortic atherosclerosis. No bone lesions. IMPRESSION: Multifocal airspace opacity consistent with atypical organism pneumonia. Similar changes on the left and right base regions compared to 2 days prior. Subtle new opacity right mid lung noted. Stable cardiac silhouette. Aortic Atherosclerosis  (ICD10-I70.0). Electronically Signed   By: Lowella Grip III M.D.   On: 11/27/2019 09:50   DG Chest Port 1 View  Result Date: 11/25/2019 CLINICAL DATA:  Shortness of breath EXAM: PORTABLE CHEST 1 VIEW COMPARISON:  CT from 3 days ago FINDINGS: Atypical pneumonia with asymmetric involvement on the left. Cardiomegaly. No visible effusion or pneumothorax. IMPRESSION: Atypical pneumonia without progression since study 6 days ago. Cardiomegaly Electronically Signed   By: Monte Fantasia M.D.   On: 11/25/2019 06:55   [place] EKG SITE RITE  Result Date: 12/11/2019 If Site Rite image not attached, placement could not be confirmed due to current cardiac rhythm.   Microbiology No results found for this or any previous visit (from the past 240 hour(s)).  Lab Basic Metabolic Panel: Recent Labs  Lab 12/16/19 0401 12/17/19 0749 12/17/19 0750 12/17/19 0750 12/18/19 0324 12/18/19 1407 12/19/19 0439 12/19/19 1230 12/20/19 0226 12/20/19 0348 12/20/19 0357 12/20/19 1212 12/20/19 1612 12/20/19 2133 2020/01/03 0431  NA   < >  --  151*   < > 153*   < > 151*   < >   < >  --  155* 153* 153* 153* 152*  152*  K   < >  --  4.1  --  4.1  --  3.8  --   --   --  4.4  --   --   --  5.0  CL   < >  --  101  --  100  --  100  --   --   --  103  --   --   --  101  CO2   < >  --  44*  --  41*  --  41*  --   --   --  44*  --   --   --  47*  GLUCOSE   < >  --  129*  --  162*  --  124*  --   --   --  98  --   --   --  181*  BUN   < >  --  64*  --  59*  --  53*  --   --   --  56*  --   --   --  55*  CREATININE   < >  --  0.75  --  0.66  --  0.51*  --   --   --  0.55*  --   --   --  0.80  CALCIUM   < >  --  8.9  --  8.7*  --  8.6*  --   --   --  8.7*  --   --   --  8.3*  MG  --  2.2  --   --  2.3  --  2.1  --   --  2.2  --   --   --   --  2.1  PHOS   < >  --  3.4  --  2.8  --  2.3*  --   --   --  2.8  --   --   --  3.2   < > =  values in this interval not displayed.   Liver Function Tests: Recent Labs  Lab  12/16/19 0402 12/16/19 0402 12/17/19 0750 12/18/19 0324 12/19/19 0439 12/20/19 0357 January 18, 2020 0431  AST 55*  --   --   --   --   --   --   ALT 97*  --   --   --   --   --   --   ALKPHOS 133*  --   --   --   --   --   --   BILITOT 0.8  --   --   --   --   --   --   PROT 5.2*  --   --   --   --   --   --   ALBUMIN 2.6*   < > 2.5* 2.4* 2.2* 2.3* 1.7*   < > = values in this interval not displayed.   No results for input(s): LIPASE, AMYLASE in the last 168 hours. No results for input(s): AMMONIA in the last 168 hours. CBC: Recent Labs  Lab 12/17/19 0749 12/18/19 0324 12/19/19 0439 12/20/19 0348 January 18, 2020 0431  WBC 8.6 6.0 7.4 5.8 11.0*  NEUTROABS 7.6 5.1 6.4 4.8 7.5  HGB 10.0* 10.0* 10.2* 10.5* 6.7*  HCT 33.0* 34.8* 35.2* 34.5* 22.6*  MCV 97.6 100.6* 100.3* 97.7 100.9*  PLT 288 265 288 297 326   Cardiac Enzymes: No results for input(s): CKTOTAL, CKMB, CKMBINDEX, TROPONINI in the last 168 hours. Sepsis Labs: Recent Labs  Lab 12/18/19 0324 12/19/19 0439 12/20/19 0348 Jan 18, 2020 0431  WBC 6.0 7.4 5.8 11.0*    Procedures/Operations  9/15 endotracheal intubation 9/15 arterial line placement 9/15 central line placement 9/15 mechanical ventilation>>10/4 9/24 PICC line placement  C. Derrill Kay, MD [facility] Jan 18, 2020, 12:12 PM  *This note was dictated using voice recognition software/Dragon.  Despite best efforts to proofread, errors can occur which can change the meaning.  Any change was purely unintentional.

## 2020-01-18 NOTE — Plan of Care (Signed)
Pt  is diseased

## 2020-01-18 NOTE — Progress Notes (Signed)
All family is gathered. They wish to transition the patient to comfort care.  Will initiate morphine infusion, wean off fentanyl and discontinue vasopressin.  They are contemplating whether to withdraw mechanical ventilation versus allow the process to occur without pressors.   Gailen Shelter, MD [facility]

## 2020-01-18 NOTE — Progress Notes (Signed)
Later entering for 0953. Pt transition to comfort care after MD meets with family will start on morphine gtt per agreement with family.

## 2020-01-18 NOTE — Progress Notes (Signed)
[name] CRITICAL CARE NOTE 59 y.o.malew/chronic neck and back pain [facility] with an incarcerated ventral hernia. In the process of being evaluated the patient was noted to be hypoxic and tested postive for COVID-19.Patient was admitted for management of acute respiratory failure with hypoxia due to COVID-19 pneumonia. Patient transferred to the ICU/stepdown on 27 November 2019 due to increasing FiO2 requirements.  SIGNIFICANT EVENTS/TEST 9/14 progressive resp failure, patient decided to be placed on VENT 9/15 patient emergently intubated, RT CVL placed, LT FEM ART PLACED 9/16severe hypoxia, proned position, PATIENT IS DNR 9/17 severe hypoxia, plan for proning today Wife [name] has been updated daily 9/18 severe ARDS hypoxia, proned positioned, wife updated daily 9/19 remains proned, severe ARDS 9/20 ARDS, resp failure, severe ARDS, PRONED, wife updated daily 9/21 severe ARDS, resp failure, severe hypoxia 9/22 plan for proning today, severe ARDS, severe Hypoxia 9/23 pt unproned tolerating well with O2 sats @96 % 9/24 continue cycling proning/supine position 9/27- FiO2 60 peep 15 patient is DNR wife asking about tracheostomy.  I spoke to wife [name] today to give update, she understands that patient is doing poorly and is at day 12 of mechanical ventilation she wishes to speak to ENT regarding trache.   12/15/19- met with family at bedside, reviewed care plan. Sent consult request for ENT today. Patient with peripheral and scrotal edema during physical examination, will plan to gently diurese today. FiO2 up to 70% today, likely atelectatic changes vs fluid shift related due to several gtt.  9/29- patient on 70% fiO2, adequate urine output overnight. Spoke to wife at bedside, reviewed short term plan and ENT plan for poss trache.  12/17/19- patient remains on 70%.  Both daughters are in room, they had numerous questions and take many photos of things in ICU as well as  patients room, they were rude and very demanding with me and nursing staff today.  They did apologize for being rude but this has now been recurrent.  I've explained to them that we are all working very hard to help patient but its a severe type of COVID and patient with persistent bilateral infiltrates/fibrosis. He has been diuresed and has had no improvement in FiO2. Overall patient is with poor prognosis.  Called and updated wife [name] - no significant changes or improvement.  12/18/19- patient remains critically ill. He is weaned to 60% today on ventilator FiO2. No lower ext edema noted on exam.  +rhonchrous breath sounds.  12/19/19- patient on 90%Fio2 today, remains crtically ill on MV. No appreciable improvement >30days. I met at bedside with Son Lurena Joiner and Wife [name] today to review overall poor prognosis and we discussed that at this point there is essentially no additional medication that we can offer to salvage patients life and prognosis is poor.  12/20/19- patient essentially unchanged from previous, Na elevated will upgrade free water from 300 to 400 q4h via oGT. Palliative to re-evaluate patient on Monday.  Family came in today (both wife and daughter) I was unable to meet with them due to emergency in unit.  01/02/20-developed hypotension, pressor dependent, in dying process.  Family at bedside expecting other family members.  Liberalizing visitation.  REVIEW OF SYSTEMS Unable to assess pt intubated mechanically ventilated  SUBJECTIVE: Unable to assess pt intubated mechanically ventilated  Vent Mode: PRVC FiO2 (%):  [75 %-80 %] 75 % Set Rate:  [30 bmp] 30 bmp Vt Set:  [450 mL] 450 mL PEEP:  [10 cmH20] 10 cmH20 Plateau Pressure:  [26 cmH20-28  cmH20] 26 cmH20   BP (!) 85/48   Pulse (!) 131   Temp 98.6 F (37 C) (Esophageal)   Resp (!) 30   Ht 5' 10.98" (1.803 m)   Wt 95.2 kg   SpO2 (!) 88%   BMI 29.29 kg/m    I/O last 3 completed shifts: In: 2555.1 [I.V.:1255.1;  NG/GT:1300] Out: 2000 [Urine:1200; Stool:800] No intake/output data recorded.  SpO2: (!) 88 % O2 Flow Rate (L/min): 80 L/min FiO2 (%): 75 %  Estimated body mass index is 29.29 kg/m as calculated from the following:   Height as of this encounter: 5' 10.98" (1.803 m).   Weight as of this encounter: 95.2 kg.   PHYSICAL EXAMINATION:  GENERAL: Terminally ill appearing male, synchronous, mechanically ventilated HEAD: Normocephalic, atraumatic EYES: Pupils equal, round, sluggish to light.  No scleral icterus.  MOUTH: Orotracheally intubated, OG in place. NECK: Supple. Trachea midline. No JVD.  Previously noted skin breakdown markedly improved mostly healed PULMONARY: Coarse breath sounds  CARDIOVASCULAR:Regular rate and rhythm noted on monitor ABDOMEN: Nondistended, soft, +scrotal edema MUSCULOSKELETAL: +2 anasarca NEUROLOGIC: Obtunded SKIN: facial pressure injuries mostly healed  Scheduled Meds: . vitamin C  1,000 mg Per Tube Daily  . chlorhexidine gluconate (MEDLINE KIT)  15 mL Mouth Rinse BID  . Chlorhexidine Gluconate Cloth  6 each Topical Daily  . docusate  100 mg Per Tube BID  . enoxaparin (LOVENOX) injection  40 mg Subcutaneous Q24H  . famotidine  20 mg Per Tube BID  . feeding supplement (PROSource TF)  90 mL Per Tube BID  . free water  400 mL Per Tube Q4H  . insulin aspart  0-15 Units Subcutaneous Q4H  . mouth rinse  15 mL Mouth Rinse 10 times per day  . polyethylene glycol  17 g Per Tube Daily  . sodium chloride flush  10-40 mL Intracatheter Q12H  . zinc sulfate  220 mg Per Tube Daily   Continuous Infusions: . dextrose Stopped (12/18/19 1750)  . feeding supplement (VITAL 1.5 CAL) 1,000 mL (12/20/19 1900)  . fentaNYL infusion INTRAVENOUS 400 mcg/hr (Jan 02, 2020 0700)  . norepinephrine (LEVOPHED) Adult infusion 40 mcg/min (2020/01/02 0820)  . propofol (DIPRIVAN) infusion Stopped (12/16/19 0720)  . vasopressin 0.03 Units/min (2020/01/02 0700)   PRN Meds:.acetaminophen,  fentaNYL, ondansetron **OR** ondansetron (ZOFRAN) IV, sodium chloride flush      Indwelling Urinary Catheter continued, requirement due to   Reason to continue Indwelling Urinary Catheter strict Intake/Output monitoring for hemodynamic instability   Central Line/ DC central line, PICC line 9/24  Reason to continue [employer] continued, requirement due to severe respiratory failure   Ventilator Sedation  obtunded, fentanyl for pain/comfort      ASSESSMENT AND PLAN SYNOPSIS  Acute hypoxemic respiratory failure due to COVID-19 pneumonia / ARDS Has failed protocols for ARDS Continued vent dependence and high FiO2 requirement Pulmonary fibrosis post ARDS Currently appears terminal Family at bedside   Acute toxic metabolic encephalopathy Ventilator discomfort sedation On fentanyl for discomfort Mostly obtunded  SHOCK Shock physiology has corrected  Thrombocytopenia -resolved Severe anemia  Due to severe critical illness Bone marrow suppression from viral illness Has been on Lovenox, DC  ID Monitor fever/WBCs No antibiotics indicated  Prophylaxis DVT: Lovenox-we will DC GI: Famotidine   Severe protein calorie malnutrition Hypoalbuminemia Discontinuing tube feeds   ENDO Transitioning to comfort   Care coordination was performed with bedside nurse.  Patient is terminal, he is currently on dual pressors with little response.  Family gathering  at bedside likely transition to comfort care.  Renold Don, MD [facility]   *This note was dictated using voice recognition software/Dragon.  Despite best efforts to proofread, errors can occur which can change the meaning.  Any change was purely unintentional.

## 2020-01-18 DEATH — deceased

## 2021-06-27 IMAGING — DX DG CHEST 1V PORT
1 series · 2 of 2 positions shown · non-contrast
Comparison: Multiple prior most recent 12/06/2019

CLINICAL DATA: 59-year-old male with a history of COVID,
respiratory failure

EXAM:
PORTABLE CHEST 1 VIEW

[Series 2: chest pa · 0.14mm/px · 2 of 2 slices shown]
[im 1/2]
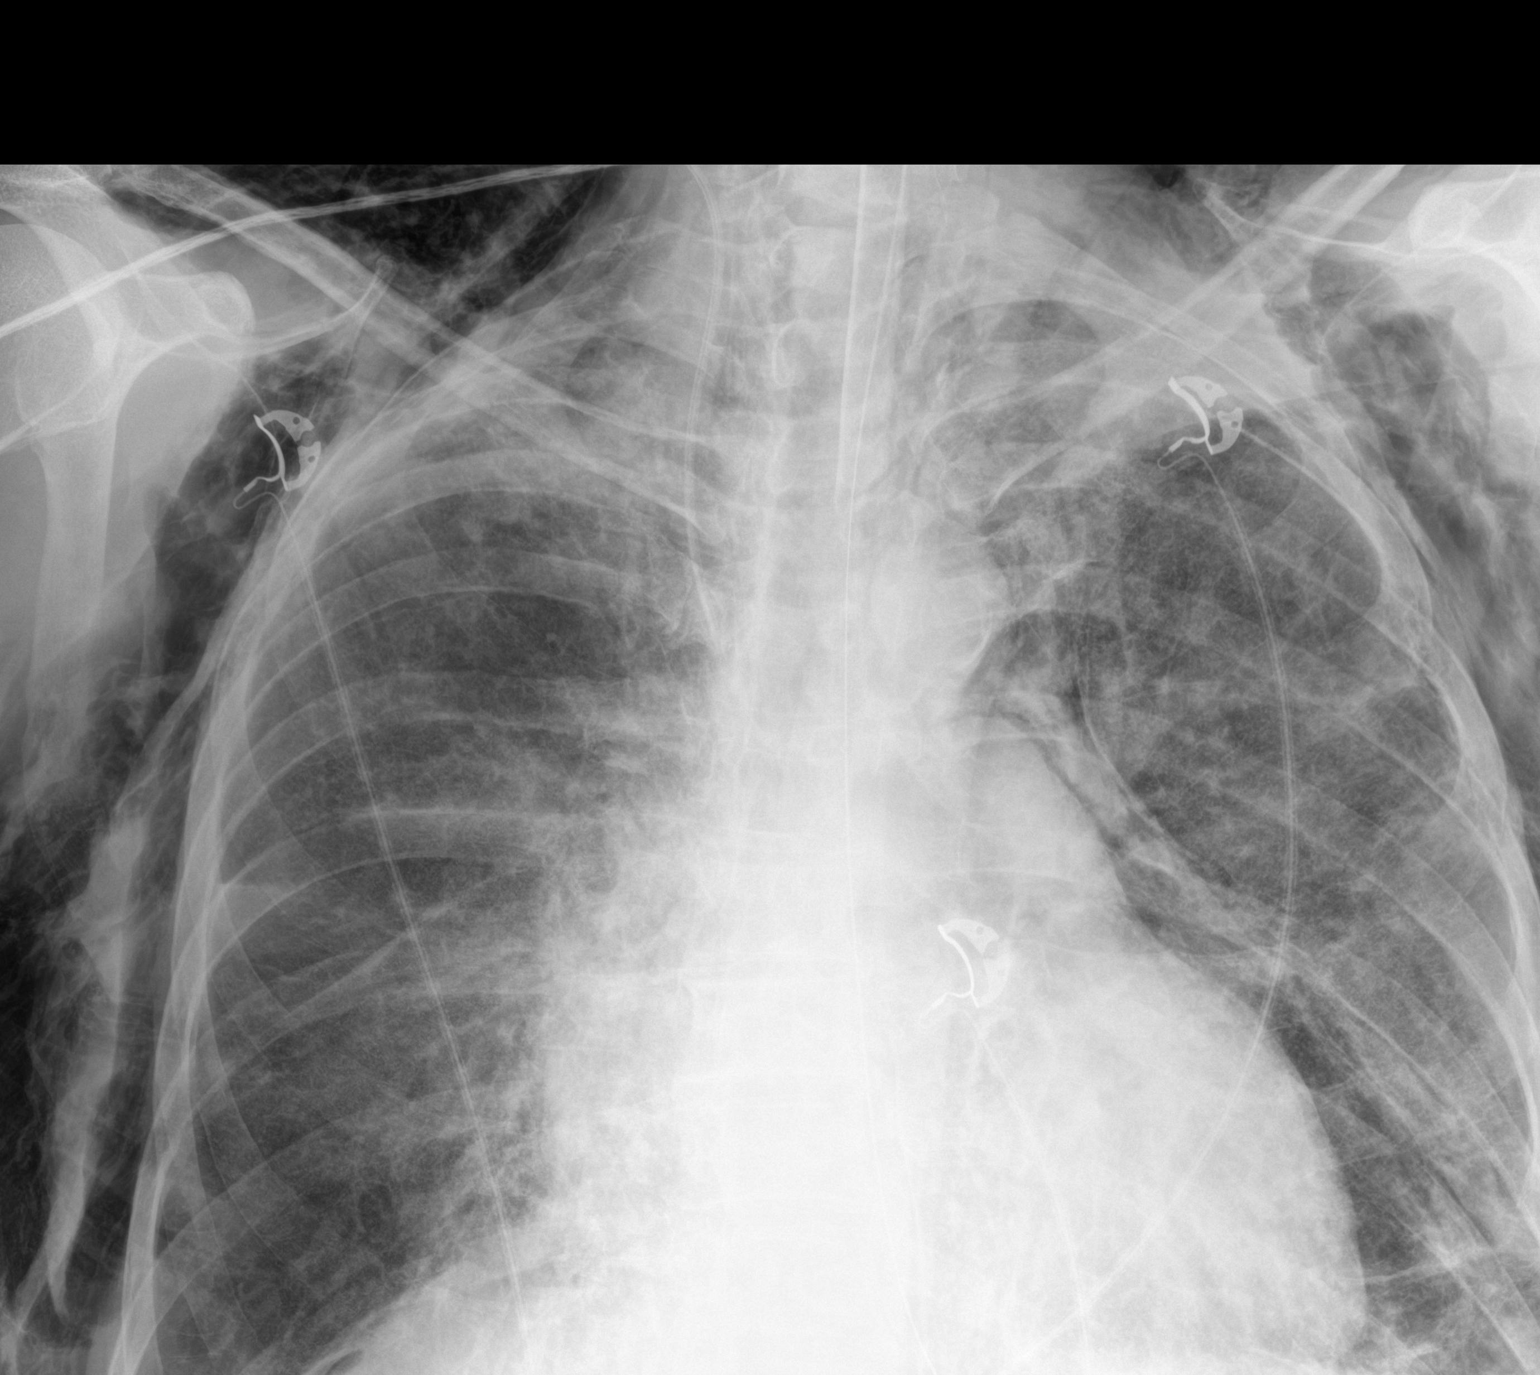
[im 2/2]
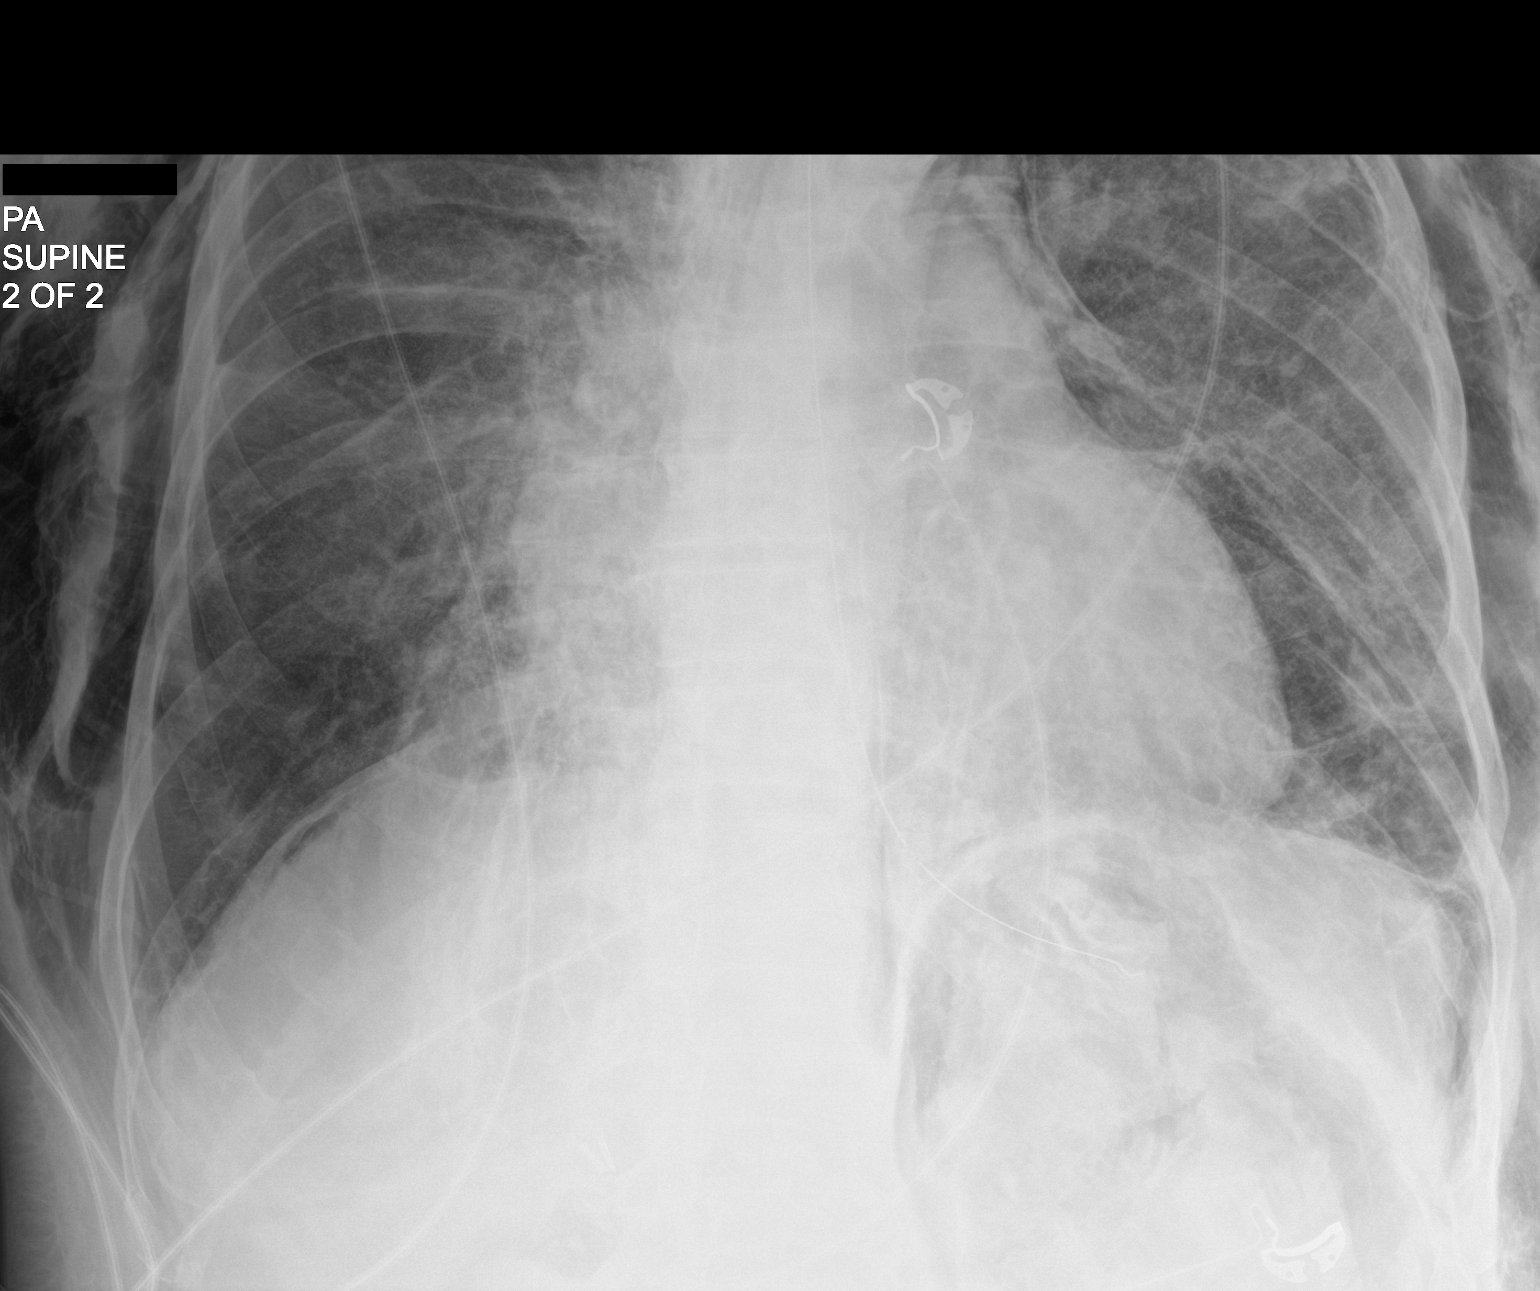

[2 of 2 positions shown; findings below may reference images not displayed]

FINDINGS: Cardiomediastinal silhouette unchanged.

Unchanged endotracheal tube terminating at the level of the
clavicular heads.

Unchanged right IJ central venous catheter. Unchanged gastric tube
which terminates in the left upper quadrant.

Extensive subcutaneous/myofacial gas of the bilateral chest wall.

Lucencies at the interfaces the mediastinum, compatible with
persisting mediastinal gas.

Linear gas in the right upper quadrant, appears to be under right
hemidiaphragm. This is new from the comparison.

Similar appearance of the lungs with reticulonodular opacities. No
pneumothorax. No pleural effusion.
IMPRESSION: New linear gas projecting under the right hemidiaphragm, concerning
for free abdominal/peritoneal air.

These results were discussed by telephone at the time of
interpretation on 12/07/2019 at [DATE] with the nurse caring for the
patient, Ms Nomasibulele.

Redemonstration mediastinal gas, bilateral chest wall
myofacial/subcutaneous emphysema

No pneumothorax. Similar appearance of the bilateral lungs with
bilateral mixed interstitial and airspace disease compatible with
combination of acute and chronic changes

Unchanged endotracheal tube, right IJ central venous catheter, and
gastric tube

## 2021-07-02 IMAGING — DX DG CHEST 1V PORT
1 series · 1 of 1 positions shown · non-contrast
Comparison: 12/11/2019 and prior radiographs

CLINICAL DATA: Respiratory failure

EXAM:
PORTABLE CHEST 1 VIEW

[chest ap]
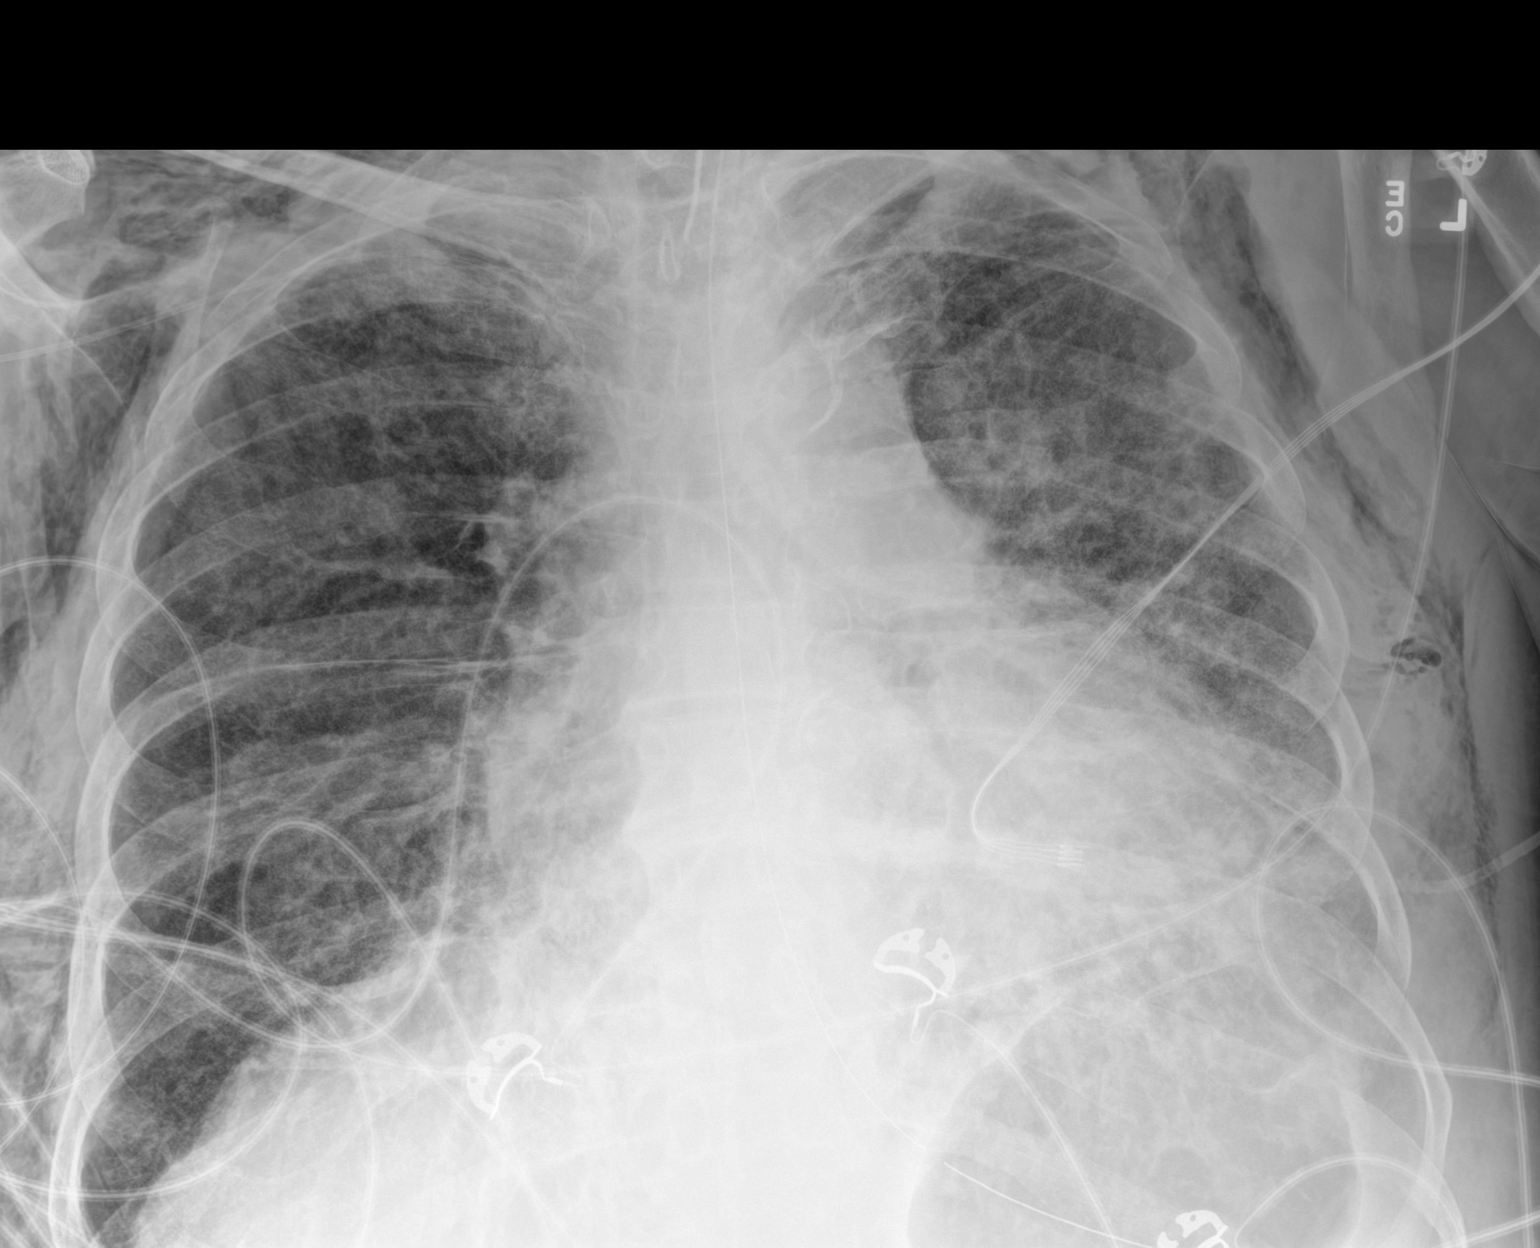

[1 of 1 positions shown; findings below may reference images not displayed]

FINDINGS: Endotracheal tube is noted with tip 6 cm above the carina.

NG tube enters the stomach.

Diffuse bilateral airspace opacities and diffuse subcutaneous
emphysema are again noted.

There is no evidence of pneumothorax.
IMPRESSION: Unchanged appearance of the chest with diffuse bilateral airspace
opacities and subcutaneous emphysema.

## 2021-07-05 IMAGING — DX DG CHEST 1V PORT
1 series · 1 of 1 positions shown · non-contrast
Comparison: 12/12/2019

CLINICAL DATA: Acute respiratory failure

EXAM:
PORTABLE CHEST 1 VIEW

[chest ap]
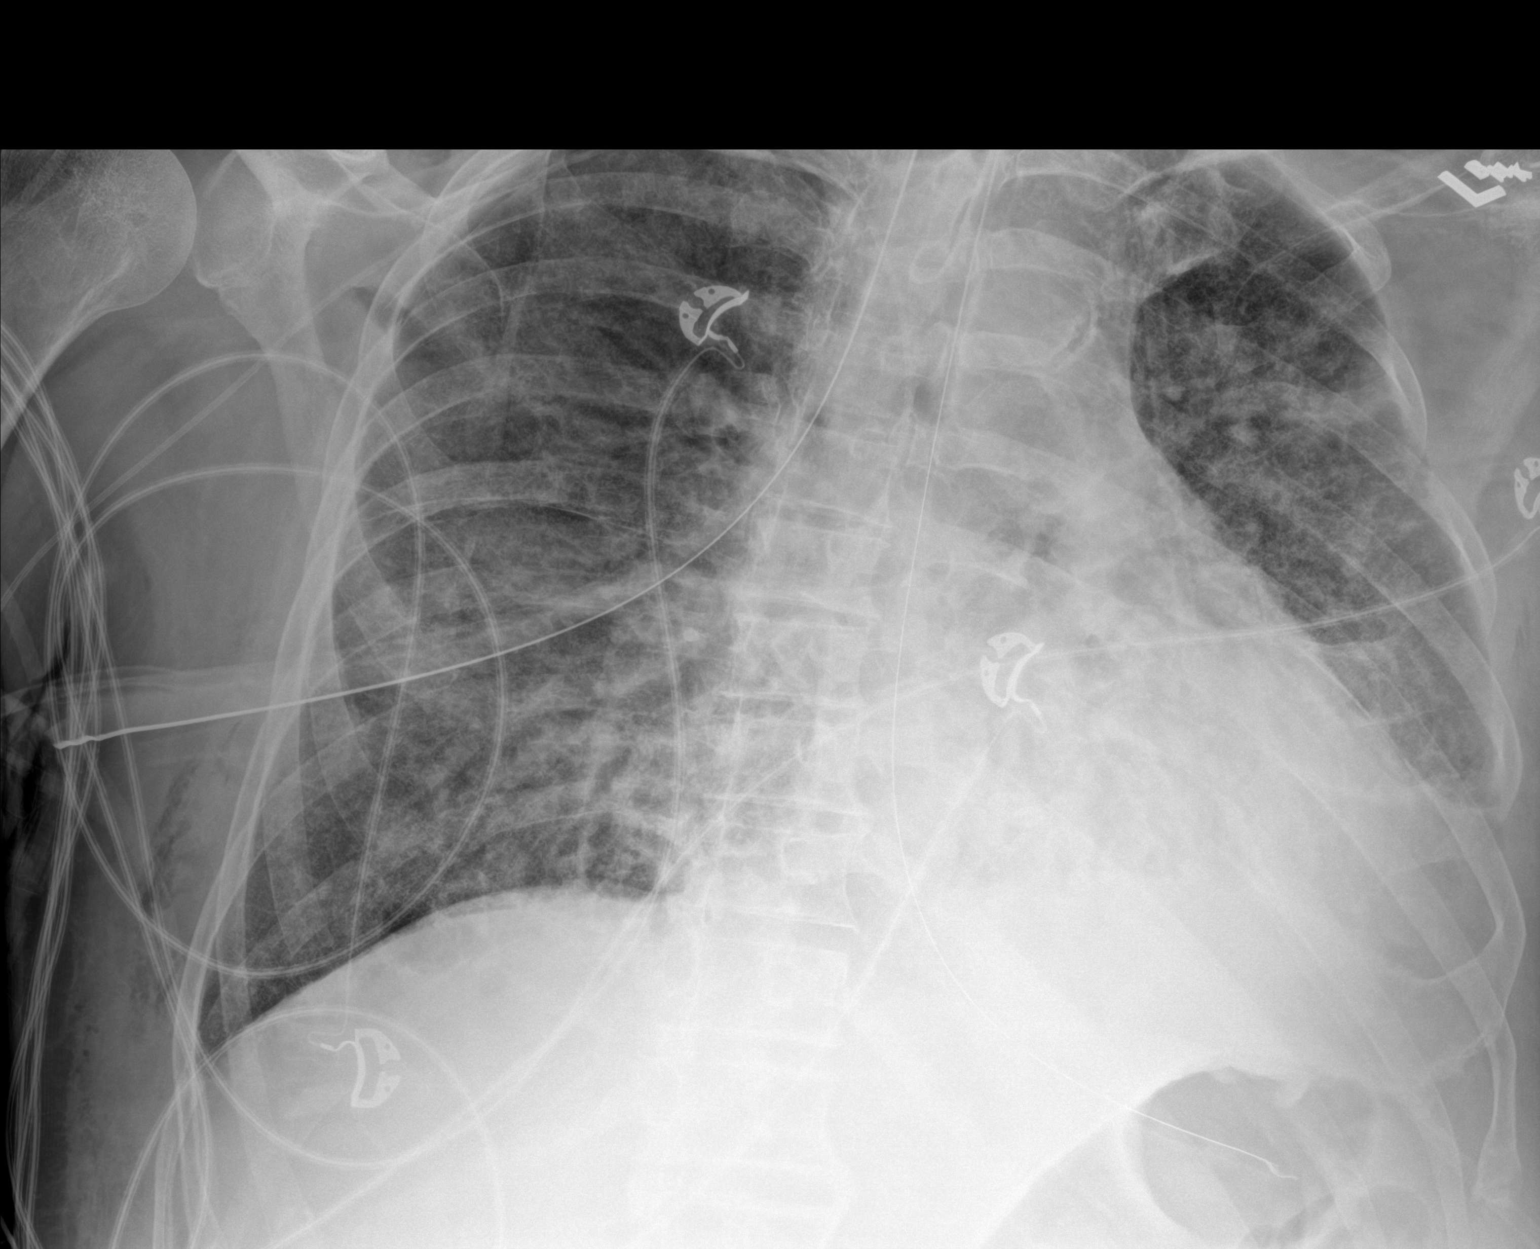

[1 of 1 positions shown; findings below may reference images not displayed]

FINDINGS: Endotracheal tube may have been removed. Enteric tube and right PICC
line are unchanged in position. Cardiac enlargement. Bilateral
pulmonary infiltrates. Subcutaneous emphysema bilaterally. Similar
appearance to previous study.
IMPRESSION: Endotracheal tube may have been removed. Persistent bilateral
pulmonary infiltrates and subcutaneous emphysema.
